# Patient Record
Sex: Female | Born: 1950
Health system: Southern US, Community
[De-identification: ages and names within clinical notes are randomized; demographics above are authoritative.]

## PROBLEM LIST (undated history)

## (undated) DIAGNOSIS — I10 Essential (primary) hypertension: Secondary | ICD-10-CM

## (undated) DIAGNOSIS — N200 Calculus of kidney: Secondary | ICD-10-CM

## (undated) DIAGNOSIS — E785 Hyperlipidemia, unspecified: Secondary | ICD-10-CM

## (undated) HISTORY — DX: Calculus of kidney: N20.0

## (undated) HISTORY — DX: Essential (primary) hypertension: I10

## (undated) HISTORY — DX: Hyperlipidemia, unspecified: E78.5

## (undated) HISTORY — PX: TRIGGER FINGER RELEASE: SHX641

---

## 2011-04-15 ENCOUNTER — Telehealth: Payer: Self-pay | Admitting: Internal Medicine

## 2011-04-15 NOTE — Telephone Encounter (Signed)
PT HAS TRUTRAC TEST STRIPS.  INSURANCE WANTS YOU ACCUCHECK TEST STRIPS  [facility] IN [place]  CAN THIS BE SENT TO DRUG STORE  PT HAS ? ABOUT METFORM  AND KIDNEY FAILURE  PLEASE CALL

## 2011-04-16 NOTE — Telephone Encounter (Signed)
I was reviewing the note on this patient. Please have her set up an appointment to address her questions. Thanks

## 2011-04-24 ENCOUNTER — Telehealth: Payer: Self-pay | Admitting: Internal Medicine

## 2011-04-24 NOTE — Telephone Encounter (Signed)
Pt needs test strips   She only has 4 test strips leftShe has appointment for 05/09/11 to see dr walker

## 2011-04-24 NOTE — Telephone Encounter (Signed)
error 

## 2011-04-28 ENCOUNTER — Other Ambulatory Visit: Payer: Self-pay | Admitting: Internal Medicine

## 2011-04-28 MED ORDER — GLUCOSE BLOOD VI STRP: ORAL_STRIP | Status: AC

## 2011-05-07 ENCOUNTER — Telehealth: Payer: Self-pay | Admitting: *Deleted

## 2011-05-07 ENCOUNTER — Other Ambulatory Visit (INDEPENDENT_AMBULATORY_CARE_PROVIDER_SITE_OTHER): Payer: BC Managed Care – PPO | Admitting: *Deleted

## 2011-05-07 DIAGNOSIS — N390 Urinary tract infection, site not specified: Secondary | ICD-10-CM

## 2011-05-07 DIAGNOSIS — Z0000 Encounter for general adult medical examination without abnormal findings: Secondary | ICD-10-CM

## 2011-05-07 LAB — CBC WITH DIFFERENTIAL/PLATELET
Basophils Relative: 0.3 % (ref 0.0–3.0)
Eosinophils Relative: 1.5 % (ref 0.0–5.0)
Hemoglobin: 14.4 g/dL (ref 12.0–15.0)
Lymphocytes Relative: 30.9 % (ref 12.0–46.0)
Monocytes Relative: 7 % (ref 3.0–12.0)
Neutro Abs: 3.8 10*3/uL (ref 1.4–7.7)
RBC: 4.88 Mil/uL (ref 3.87–5.11)

## 2011-05-07 LAB — LIPID PANEL
HDL: 47.8 mg/dL (ref >39.00)
LDL Cholesterol: 87 mg/dL (ref 0–99)
Total CHOL/HDL Ratio: 3
VLDL: 26.4 mg/dL (ref 0.0–40.0)

## 2011-05-07 LAB — TSH: TSH: 1.85 u[IU]/mL (ref 0.35–5.50)

## 2011-05-07 LAB — COMPREHENSIVE METABOLIC PANEL
ALT: 22 U/L (ref 0–35)
AST: 17 U/L (ref 0–37)
CO2: 25 mEq/L (ref 19–32)
Creatinine, Ser: 0.7 mg/dL (ref 0.4–1.2)
GFR: 87.41 mL/min (ref >60.00)
Total Bilirubin: 1 mg/dL (ref 0.3–1.2)

## 2011-05-07 MED ORDER — CIPROFLOXACIN HCL 500 MG PO TABS: 500.0000 mg | ORAL_TABLET | Freq: Two times a day (BID) | ORAL | Status: DC

## 2011-05-07 MED ORDER — SULFAMETHOXAZOLE-TRIMETHOPRIM 800-160 MG PO TABS: 1.0000 | ORAL_TABLET | Freq: Two times a day (BID) | ORAL | Status: DC

## 2011-05-07 NOTE — Telephone Encounter (Signed)
Pharmacy called and stated that patient is allergic to sulfa drugs, but patient had stated that she wasn't. Pharmacy is asking if something else could be called in. I also added sulfa drugs to her allergy list. Please advise.

## 2011-05-07 NOTE — Telephone Encounter (Signed)
Rx sent to pharmacy   

## 2011-05-07 NOTE — Progress Notes (Signed)
Addended by: Jobie Quaker on: 05/07/2011 12:08 PM   Modules accepted: Orders

## 2011-05-07 NOTE — Telephone Encounter (Signed)
OK. Please call in Cipro 500mg  by mouth twice daily x7 days. Disp 14 no refill.

## 2011-05-07 NOTE — Telephone Encounter (Signed)
When patient came in for labs she also said that she has been having some burning with urination and urine frequency. I got a urine specimen from her. The results have been entered into her chart and there was also a small amount of blood so I sent for culture.

## 2011-05-07 NOTE — Telephone Encounter (Signed)
We should treat her with Bactrim DS tab po bid x7 days, disp 14. Please confirm that she is not allergic to sulfa prior to calling in medication. Thanks

## 2011-05-07 NOTE — Progress Notes (Signed)
Addended by: Jobie Quaker on: 05/07/2011 12:47 PM   Modules accepted: Orders

## 2011-05-07 NOTE — Telephone Encounter (Signed)
Patient notified- Rx sent to pharmacy. 

## 2011-05-09 ENCOUNTER — Encounter: Payer: Self-pay | Admitting: Internal Medicine

## 2011-05-09 ENCOUNTER — Ambulatory Visit (INDEPENDENT_AMBULATORY_CARE_PROVIDER_SITE_OTHER): Payer: BC Managed Care – PPO | Admitting: Internal Medicine

## 2011-05-09 DIAGNOSIS — R109 Unspecified abdominal pain: Secondary | ICD-10-CM

## 2011-05-09 DIAGNOSIS — E785 Hyperlipidemia, unspecified: Secondary | ICD-10-CM | POA: Insufficient documentation

## 2011-05-09 DIAGNOSIS — E119 Type 2 diabetes mellitus without complications: Secondary | ICD-10-CM | POA: Insufficient documentation

## 2011-05-09 MED ORDER — METRONIDAZOLE 500 MG PO TABS: 500.0000 mg | ORAL_TABLET | Freq: Three times a day (TID) | ORAL | Status: AC

## 2011-05-09 NOTE — Patient Instructions (Signed)
Diverticulitis A diverticulum is a small pouch or sac on the colon. Diverticulosis is the presence of these diverticula on the colon. Diverticulitis is the irritation (inflammation) or infection of diverticula. CAUSES The colon and its diverticula contain germs (bacteria). If food particles block the tiny opening to a diverticulum, the bacteria inside can grow and cause an increase in pressure. This leads to infection and inflammation. SYMPTOMS  Belly (abdominal) pain and tenderness. Usually, the pain is located on the left side of your abdomen. However, it could be located elsewhere.   Fever.   Bloating.   Feeling sick to your stomach (nausea).   Throwing up (vomiting).   Abnormal stools.  DIAGNOSIS Your caregiver will take a history and perform a physical exam. Since many things can cause abdominal pain, other tests may be necessary. Tests may include:  Blood tests.  Urine tests.   X-ray of the abdomen.  CT scan of the abdomen.   Sometimes, surgery is needed to determine if diverticulitis or other conditions are causing your symptoms. TREATMENT Most of the time, you can be treated without surgery. Treatment includes:  Resting the bowels by only having liquids for a few days.   Intravenous (IV) fluids if you are losing fluids (dehydrated).   Medicines (antibiotics) that kill germs may be given.   Pain and nausea medicine, if needed.   As you improve, eating soft, easily digestible foods.   Surgery if the inflamed diverticulum has burst.  HOME CARE INSTRUCTIONS  Take all medicine as directed by your caregiver.   Try a clear liquid diet (broth, tea, or water, or as directed by your caregiver). You may then gradually begin a bland diet as tolerated.   You may be put on a high-fiber diet. Avoid nuts and seeds. Follow your caregiver's diet recommendations.   If your caregiver has given you a follow-up appointment, it is very important that you go. Not going could result  in lasting (chronic) or permanent injury, pain, and disability. If there is any problem keeping the appointment, call to reschedule.  SEEK MEDICAL CARE IF:  Pain does not improve.   You have a hard time advancing your diet beyond clear liquids.   Bowel movements do not return to normal.  SEEK IMMEDIATE MEDICAL CARE IF:  The pain becomes worse.   You have an oral temperature above 101.5, not controlled by medicine.   Repeated vomiting occurs.   You have bloody or black, tarry stools.   Symptoms that brought you to your caregiver become worse or are not getting better.  MAKE SURE YOU:  Understand these instructions.   Will watch your condition.   Will get help right away if you are not doing well or get worse.  Document Released: 05/14/2005 Document Re-Released: 10/29/2009 [employer] Patient Information 2011 [employer]. 

## 2011-05-09 NOTE — Progress Notes (Signed)
Subjective:    Patient ID: Amber Oliver, female    DOB: 30-Jan-1951, 60 y.o.   MRN: [id number]  HPI Ms. Lair is a 60 year old female with a history of diabetes mellitus who presents for followup. Her main concern today is her recent episode of left lower quadrant pain. This started approximately 2 weeks ago. She was concerned about a possible urinary tract infection and, when in the  office to have lab work performed, she gave a urine sample which was remarkable for a small amount of blood. Her urine was sent for culture and she was empirically treated with Cipro. After taking one dose of Cipro she reports that she developed hives and facial swelling. She took Benadryl with relief of her symptoms, and stopped her Cipro. Subsequently her urine culture came back with no growth. She reports that she continues to have some mild left lower quadrant pain. She notes a possible history of diverticulitis. However, she has no diarrhea, constipation, or blood noted in her stool. She reports a normal appetite and activity level and denies any fever or chills.  In regards to her diabetes, she reports that her blood sugars have been well-controlled. She brings a record today showing most of her blood sugars fasting between 80 and 110. Her postprandial blood sugar levels are typically between 100 and 150. She has one outlier at 207. She reports full compliance with her metformin.  Outpatient Encounter Prescriptions as of 05/09/2011  Medication Sig Dispense Refill  . atorvastatin (LIPITOR) 20 MG tablet Take 20 mg by mouth daily.       . cholecalciferol (VITAMIN D) 1000 UNITS tablet Take 1,000 Units by mouth 2 (two) times daily.        Marland Kitchen glucose blood (ACCU-CHEK ACTIVE STRIPS) test strip Use as directed  100 each  5  . metFORMIN (GLUCOPHAGE) 500 MG tablet Take 500 mg by mouth daily. 1/2 tablet       . metroNIDAZOLE (FLAGYL) 500 MG tablet Take 1 tablet (500 mg total) by mouth 3 (three) times daily.  42 tablet  0      Review of Systems  Constitutional: Negative for fever, chills, appetite change, fatigue and unexpected weight change.  HENT: Negative for ear pain, congestion, sore throat, trouble swallowing, neck pain, voice change and sinus pressure.   Eyes: Negative for visual disturbance.  Respiratory: Negative for cough, shortness of breath, wheezing and stridor.   Cardiovascular: Negative for chest pain, palpitations and leg swelling.  Gastrointestinal: Positive for abdominal pain. Negative for nausea, vomiting, diarrhea, constipation, blood in stool, abdominal distention and anal bleeding.  Genitourinary: Negative for dysuria, frequency, flank pain, vaginal bleeding, vaginal discharge, vaginal pain and pelvic pain.  Musculoskeletal: Negative for myalgias, arthralgias and gait problem.  Skin: Negative for color change and rash.  Neurological: Negative for dizziness and headaches.  Hematological: Negative for adenopathy. Does not bruise/bleed easily.  Psychiatric/Behavioral: Negative for suicidal ideas, sleep disturbance and dysphoric mood. The patient is not nervous/anxious.    BP 165/99  Pulse 83  Temp(Src) 99 F (37.2 C) (Oral)  Resp 16  Ht 5' 4.5" (1.638 m)  Wt 157 lb (71.215 kg)  BMI 26.53 kg/m2  SpO2 98%     Objective:   Physical Exam  Constitutional: She is oriented to person, place, and time. She appears well-developed and well-nourished. No distress.  HENT:  Head: Normocephalic and atraumatic.  Right Ear: External ear normal.  Left Ear: External ear normal.  Nose: Nose normal.  Mouth/Throat: Oropharynx is clear  and moist. No oropharyngeal exudate.  Eyes: Conjunctivae are normal. Pupils are equal, round, and reactive to light. Right eye exhibits no discharge. Left eye exhibits no discharge. No scleral icterus.  Neck: Normal range of motion. Neck supple. No tracheal deviation present. No thyromegaly present.  Cardiovascular: Normal rate, regular rhythm, normal heart sounds and  intact distal pulses.  Exam reveals no gallop and no friction rub.   No murmur heard. Pulmonary/Chest: Effort normal and breath sounds normal. No respiratory distress. She has no wheezes. She has no rales. She exhibits no tenderness.  Abdominal: Soft. She exhibits no distension and no mass. There is no hepatosplenomegaly. There is tenderness in the left lower quadrant. There is no rebound and no guarding.  Musculoskeletal: Normal range of motion. She exhibits no edema and no tenderness.  Lymphadenopathy:    She has no cervical adenopathy.  Neurological: She is alert and oriented to person, place, and time. No cranial nerve deficit. She exhibits normal muscle tone. Coordination normal.  Skin: Skin is warm and dry. No rash noted. She is not diaphoretic. No erythema. No pallor.  Psychiatric: She has a normal mood and affect. Her behavior is normal. Judgment and thought content normal.          Assessment & Plan:  1. Left Lower Quadrant Pain - patient with left lower quadrant pain and initially dysuria. Urine dip was remarkable for blood and patient was empirically treated for urinary tract infection with Cipro. However, she had an allergic reaction to Cipro so this medication was stopped. In the interim her urine culture came back negative. Her symptoms today are more suggestive of either diverticulitis or nephrolithiasis. Alternatively an ovarian cyst might present in this fashion. We will plan to treat her empirically with Flagyl. She has a followup visit with her gynecologist on Monday for pelvic ultrasound. If her pain persists or worsen I would prefer to get a CT of her abdomen without contrast to evaluate for nephrolithiasis. She will return to clinic in two weeks or earlier if symptoms worsen.  2. Diabetes Mellitus - patient with diabetes mellitus which has been very well controlled based on her report. Hemoglobin A1c was not sent with recent labs. We will plan to wait for 3 months and recheck  hemoglobin A1c at that time. Patient was offered flu shot today, however refused. Foot exam is remarkable for persistent onychomycosis. We discussed referral to podiatry. She would like to hold off for now. Need to obtain report on recent ophthalmology exam. Patient is on a statin. Blood pressure has been too low in the past for ACE inhibitor, however is elevated today. Will monitor at next visit in two weeks and consider adding ACEi.

## 2011-06-26 ENCOUNTER — Other Ambulatory Visit: Payer: Self-pay | Admitting: Internal Medicine

## 2011-06-30 ENCOUNTER — Telehealth: Payer: Self-pay | Admitting: Internal Medicine

## 2011-06-30 NOTE — Telephone Encounter (Signed)
Patient called in and states she needs a letter of medical necessity to appeal the insurance company to have her test strips covered.

## 2011-07-02 NOTE — Telephone Encounter (Signed)
Patient has called back and is worried that she is going to run out of time to appeal this.  Please advise.

## 2011-07-02 NOTE — Telephone Encounter (Signed)
Yes, fluctuating blood sugar.

## 2011-07-02 NOTE — Telephone Encounter (Signed)
I spoke w/pt - she has spoken with her insurance company. They are requiring a letter stating why she needs to test twice daily. Reason must be fluctuating blood sugar or med changes etc... Pt would like to pick up when ready. OK for this letter?

## 2011-07-11 NOTE — Telephone Encounter (Signed)
Letter pending signature

## 2011-07-14 NOTE — Telephone Encounter (Signed)
Letter up front for pick up. Patient informed. 

## 2011-07-24 ENCOUNTER — Encounter: Payer: Self-pay | Admitting: Internal Medicine

## 2011-07-24 ENCOUNTER — Other Ambulatory Visit (INDEPENDENT_AMBULATORY_CARE_PROVIDER_SITE_OTHER): Payer: Self-pay | Admitting: *Deleted

## 2011-07-24 DIAGNOSIS — E119 Type 2 diabetes mellitus without complications: Secondary | ICD-10-CM

## 2011-07-24 LAB — COMPREHENSIVE METABOLIC PANEL
ALT: 18 U/L (ref 0–35)
AST: 19 U/L (ref 0–37)
Alkaline Phosphatase: 64 U/L (ref 39–117)
CO2: 26 mEq/L (ref 19–32)
Creatinine, Ser: 0.8 mg/dL (ref 0.4–1.2)
GFR: 76.25 mL/min (ref >60.00)
Total Bilirubin: 0.3 mg/dL (ref 0.3–1.2)

## 2011-07-24 LAB — LIPID PANEL
HDL: 49.8 mg/dL (ref >39.00)
Total CHOL/HDL Ratio: 3
Triglycerides: 88 mg/dL (ref 0.0–149.0)
VLDL: 17.6 mg/dL (ref 0.0–40.0)

## 2011-07-28 ENCOUNTER — Encounter: Payer: Self-pay | Admitting: Internal Medicine

## 2011-07-28 ENCOUNTER — Ambulatory Visit (INDEPENDENT_AMBULATORY_CARE_PROVIDER_SITE_OTHER): Payer: BC Managed Care – PPO | Admitting: Internal Medicine

## 2011-07-28 VITALS — BP 142/74 | HR 73 | Temp 98.2°F | Wt 155.0 lb

## 2011-07-28 DIAGNOSIS — R109 Unspecified abdominal pain: Secondary | ICD-10-CM

## 2011-07-28 NOTE — Progress Notes (Signed)
Subjective:    Patient ID: Amber Oliver, female    DOB: [birth year], 60 y.o.   MRN: [id number]  HPI 60 year old female with a history of diabetes, hyperlipidemia, and recent episodes of abdominal pain presents for followup. She reports that she has been feeling well. She continues to have some intermittent episodes of watery diarrhea and left lower abdominal pain. She has not had blood in her stool. She has not had black tarry stools. She was treated with Cipro and Flagyl for suspected diverticulitis in symptoms improve somewhat. She would like to have followup visit with her GI physician. She reports that she is concerned that a 10 year interval from her last colonoscopy may be too long. She has also been taking some probiotics over-the-counter with minimal improvement in her symptoms.  In regards to her diabetes, she reports full compliance with her metformin. She has been checking her blood sugar typically 1-2 times per day. She has not had any blood sugars greater than 150.  In regards to her hyperlipidemia, we reviewed labs from last week which show LDL is at goal with near 70. She reports full compliance with her Lipitor. She denies any side effects from his medications such as myalgia.  Outpatient Encounter Prescriptions as of 07/28/2011  Medication Sig Dispense Refill  . atorvastatin (LIPITOR) 20 MG tablet Take 20 mg by mouth daily.       . cholecalciferol (VITAMIN D) 1000 UNITS tablet Take 1,000 Units by mouth 2 (two) times daily.        . fish oil-omega-3 fatty acids 1000 MG capsule Take 2 g by mouth daily. MEGA RED BRAND       . glucose blood (ACCU-CHEK ACTIVE STRIPS) test strip Use as directed  100 each  5  . metFORMIN (GLUCOPHAGE) 500 MG tablet TAKE ONE TABLET BY MOUTH TWICE DAILY  60 tablet  6    Review of Systems  Constitutional: Negative for fever, chills, appetite change, fatigue and unexpected weight change.  Eyes: Negative for visual disturbance.  Respiratory: Negative for  cough and shortness of breath.   Cardiovascular: Negative for chest pain, palpitations and leg swelling.  Gastrointestinal: Positive for abdominal pain and diarrhea. Negative for nausea, vomiting, constipation, blood in stool, abdominal distention and anal bleeding.  Genitourinary: Negative for dysuria and flank pain.  Musculoskeletal: Negative for myalgias, arthralgias and gait problem.  Skin: Negative for color change and rash.  Neurological: Negative for dizziness and headaches.  Hematological: Negative for adenopathy. Does not bruise/bleed easily.  Psychiatric/Behavioral: Negative for suicidal ideas, sleep disturbance and dysphoric mood. The patient is not nervous/anxious.    BP 142/74  Pulse 73  Temp(Src) 98.2 F (36.8 C) (Oral)  Wt 155 lb (70.308 kg)  SpO2 99%     Objective:   Physical Exam  Constitutional: She is oriented to person, place, and time. She appears well-developed and well-nourished. No distress.  HENT:  Head: Normocephalic and atraumatic.  Right Ear: External ear normal.  Left Ear: External ear normal.  Nose: Nose normal.  Mouth/Throat: Oropharynx is clear and moist. No oropharyngeal exudate.  Eyes: Conjunctivae are normal. Pupils are equal, round, and reactive to light. Right eye exhibits no discharge. Left eye exhibits no discharge. No scleral icterus.  Neck: Normal range of motion. Neck supple. No tracheal deviation present. No thyromegaly present.  Cardiovascular: Normal rate, regular rhythm, normal heart sounds and intact distal pulses.  Exam reveals no gallop and no friction rub.   No murmur heard. Pulmonary/Chest: Effort normal and  breath sounds normal. No respiratory distress. She has no wheezes. She has no rales. She exhibits no tenderness.  Abdominal: Soft. Bowel sounds are normal. She exhibits no distension and no mass. There is no tenderness. There is no rebound and no guarding.  Musculoskeletal: Normal range of motion. She exhibits no edema and no  tenderness.  Lymphadenopathy:    She has no cervical adenopathy.  Neurological: She is alert and oriented to person, place, and time. No cranial nerve deficit. She exhibits normal muscle tone. Coordination normal.  Skin: Skin is warm and dry. No rash noted. She is not diaphoretic. No erythema. No pallor.  Psychiatric: She has a normal mood and affect. Her behavior is normal. Judgment and thought content normal.          Assessment & Plan:  1. Diabetes - will continue metformin at night. Recent hemoglobin A1c was 5.9%. Encouraged her to check her sugar once per day. Followup in 3 months.  2. Abdominal pain -symptoms are improved and her exam is normal today. She is  concerned about the 10 year interval from her last colonoscopy and would like to repeat this sooner. She would like GI followup. We will set this up for her. We discussed ordering CT abdomen to better identify cause of her symptoms but she would like to defer until see by GI. She was previously seen at [facility]. Suspect that some of her symptoms may be secondary to IBS. We discussed probiotic such as Librarian, academic. She will follow up here in 3 months and prn.  3. Hyperlipidemia - patient with HL on Lipitor. Recent LDL is well controlled at 78. We'll continue current dose. Repeat lipids in 6 months.

## 2011-10-24 ENCOUNTER — Other Ambulatory Visit (INDEPENDENT_AMBULATORY_CARE_PROVIDER_SITE_OTHER): Payer: BC Managed Care – PPO

## 2011-10-24 DIAGNOSIS — E119 Type 2 diabetes mellitus without complications: Secondary | ICD-10-CM

## 2011-10-24 LAB — COMPREHENSIVE METABOLIC PANEL
ALT: 24 U/L (ref 0–35)
CO2: 28 mEq/L (ref 19–32)
Creatinine, Ser: 0.7 mg/dL (ref 0.4–1.2)
GFR: 85.9 mL/min (ref >60.00)
Total Bilirubin: 0.4 mg/dL (ref 0.3–1.2)

## 2011-10-24 LAB — LIPID PANEL
HDL: 52.1 mg/dL (ref >39.00)
Total CHOL/HDL Ratio: 3
Triglycerides: 114 mg/dL (ref 0.0–149.0)

## 2011-10-24 LAB — HEMOGLOBIN A1C: Hgb A1c MFr Bld: 6 % (ref 4.6–6.5)

## 2011-10-24 NOTE — Progress Notes (Signed)
Addended by: Baldomero Lamy on: 10/24/2011 08:08 AM   Modules accepted: Orders

## 2011-10-28 ENCOUNTER — Ambulatory Visit (INDEPENDENT_AMBULATORY_CARE_PROVIDER_SITE_OTHER): Payer: BC Managed Care – PPO | Admitting: Internal Medicine

## 2011-10-28 ENCOUNTER — Encounter: Payer: Self-pay | Admitting: Internal Medicine

## 2011-10-28 DIAGNOSIS — R109 Unspecified abdominal pain: Secondary | ICD-10-CM | POA: Insufficient documentation

## 2011-10-28 DIAGNOSIS — E785 Hyperlipidemia, unspecified: Secondary | ICD-10-CM

## 2011-10-28 DIAGNOSIS — E119 Type 2 diabetes mellitus without complications: Secondary | ICD-10-CM

## 2011-10-28 NOTE — Assessment & Plan Note (Signed)
Pain is now resolved. Patient reports that recent endoscopy was normal. Will request notes on this. Patient will call or return to clinic if symptoms recur.

## 2011-10-28 NOTE — Assessment & Plan Note (Signed)
Cholesterol well controlled on Lipitor. Will continue and repeat lipids and LFTs in 6 months.

## 2011-10-28 NOTE — Progress Notes (Signed)
Subjective:    Patient ID: Amber Oliver, female    DOB: 27-Feb-1951, 61 y.o.   MRN: [id number]  HPI 61 year old female with history of diabetes and hyperlipidemia presents for followup. She recently had an episode of lower abdominal pain and was seen by GI physician at [facility] for evaluation which included endoscopy. She reports the results of the endoscopy, CT of the abdomen, an MRI of the abdomen were all normal with the exception of a few internal hemorrhoids. She reports that the pain has now resolved. She has not had any further abdominal pain, distention, nausea, vomiting, diarrhea, or other complaints. Next  In regards to her diabetes, she reports her blood sugars have been well-controlled. She reports full compliance with her metformin.  In regards to hyperlipidemia, recent LDL was near goal at 71. She reports full compliance with Lipitor. She denies any noted side effects from this medication.  Outpatient Encounter Prescriptions as of 10/28/2011  Medication Sig Dispense Refill  . atorvastatin (LIPITOR) 20 MG tablet Take 20 mg by mouth daily.       . cholecalciferol (VITAMIN D) 1000 UNITS tablet Take 1,000 Units by mouth 2 (two) times daily.        . fish oil-omega-3 fatty acids 1000 MG capsule Take 2 g by mouth daily. MEGA RED BRAND       . glucose blood (ACCU-CHEK ACTIVE STRIPS) test strip Use as directed  100 each  5  . metFORMIN (GLUCOPHAGE) 500 MG tablet TAKE ONE TABLET BY MOUTH TWICE DAILY  60 tablet  6    Review of Systems  Constitutional: Negative for fever, chills, appetite change, fatigue and unexpected weight change.  HENT: Negative for ear pain, congestion, sore throat, trouble swallowing, neck pain, voice change and sinus pressure.   Eyes: Negative for visual disturbance.  Respiratory: Negative for cough, shortness of breath, wheezing and stridor.   Cardiovascular: Negative for chest pain, palpitations and leg swelling.  Gastrointestinal: Negative for nausea, vomiting,  abdominal pain, diarrhea, constipation, blood in stool, abdominal distention and anal bleeding.  Genitourinary: Negative for dysuria and flank pain.  Musculoskeletal: Negative for myalgias, arthralgias and gait problem.  Skin: Negative for color change and rash.  Neurological: Negative for dizziness and headaches.  Hematological: Negative for adenopathy. Does not bruise/bleed easily.  Psychiatric/Behavioral: Negative for suicidal ideas, sleep disturbance and dysphoric mood. The patient is not nervous/anxious.    BP 100/62  Pulse 97  Temp(Src) 98.2 F (36.8 C) (Oral)  Wt 155 lb (70.308 kg)  SpO2 99%     Objective:   Physical Exam  Constitutional: She is oriented to person, place, and time. She appears well-developed and well-nourished. No distress.  HENT:  Head: Normocephalic and atraumatic.  Right Ear: External ear normal.  Left Ear: External ear normal.  Nose: Nose normal.  Mouth/Throat: Oropharynx is clear and moist. No oropharyngeal exudate.  Eyes: Conjunctivae are normal. Pupils are equal, round, and reactive to light. Right eye exhibits no discharge. Left eye exhibits no discharge. No scleral icterus.  Neck: Normal range of motion. Neck supple. No tracheal deviation present. No thyromegaly present.  Cardiovascular: Normal rate, regular rhythm, normal heart sounds and intact distal pulses.  Exam reveals no gallop and no friction rub.   No murmur heard. Pulmonary/Chest: Effort normal and breath sounds normal. No respiratory distress. She has no wheezes. She has no rales. She exhibits no tenderness.  Abdominal: Soft. Bowel sounds are normal. She exhibits no distension and no mass. There is no tenderness. There  is no rebound and no guarding.  Musculoskeletal: Normal range of motion. She exhibits no edema and no tenderness.  Lymphadenopathy:    She has no cervical adenopathy.  Neurological: She is alert and oriented to person, place, and time. No cranial nerve deficit. She exhibits  normal muscle tone. Coordination normal.  Skin: Skin is warm and dry. No rash noted. She is not diaphoretic. No erythema. No pallor.  Psychiatric: She has a normal mood and affect. Her behavior is normal. Judgment and thought content normal.          Assessment & Plan:

## 2011-10-28 NOTE — Assessment & Plan Note (Signed)
Recent A1c was normal at 6%. Will plan to continue metformin and repeat A1c in 6 months.

## 2012-02-11 ENCOUNTER — Encounter: Payer: Self-pay | Admitting: Internal Medicine

## 2012-02-23 ENCOUNTER — Other Ambulatory Visit: Payer: Self-pay | Admitting: Internal Medicine

## 2012-03-01 ENCOUNTER — Telehealth: Payer: Self-pay | Admitting: Internal Medicine

## 2012-03-01 ENCOUNTER — Ambulatory Visit (INDEPENDENT_AMBULATORY_CARE_PROVIDER_SITE_OTHER): Payer: BC Managed Care – PPO | Admitting: Internal Medicine

## 2012-03-01 ENCOUNTER — Encounter: Payer: Self-pay | Admitting: Internal Medicine

## 2012-03-01 VITALS — BP 120/82 | HR 79 | Temp 98.9°F | Ht 64.5 in | Wt 153.5 lb

## 2012-03-01 DIAGNOSIS — R300 Dysuria: Secondary | ICD-10-CM

## 2012-03-01 DIAGNOSIS — N390 Urinary tract infection, site not specified: Secondary | ICD-10-CM | POA: Insufficient documentation

## 2012-03-01 LAB — POCT URINALYSIS DIPSTICK
Protein, UA: 100
Spec Grav, UA: 1.015
Urobilinogen, UA: 0.2
pH, UA: 7.5

## 2012-03-01 MED ORDER — CEPHALEXIN 500 MG PO CAPS: 500.0000 mg | ORAL_CAPSULE | Freq: Three times a day (TID) | ORAL | Status: AC

## 2012-03-01 NOTE — Progress Notes (Signed)
Subjective:    Patient ID: Amber Oliver, female    DOB: 1950/12/14, 61 y.o.   MRN: [id number]  HPI 61 year old female presents for acute visit complaining of three-day history of lower abdominal pain and hematuria. Patient denies fever, chills, flank pain. She has been increasing her intake of water with no improvement.  Outpatient Encounter Prescriptions as of 03/01/2012  Medication Sig Dispense Refill  . atorvastatin (LIPITOR) 20 MG tablet TAKE ONE TABLET BY MOUTH EVERY DAY  90 tablet  4  . cholecalciferol (VITAMIN D) 1000 UNITS tablet Take 1,000 Units by mouth 2 (two) times daily.        . fish oil-omega-3 fatty acids 1000 MG capsule Take 2 g by mouth daily. MEGA RED BRAND       . glucose blood (ACCU-CHEK ACTIVE STRIPS) test strip Use as directed  100 each  5  . metFORMIN (GLUCOPHAGE) 500 MG tablet TAKE ONE TABLET BY MOUTH TWICE DAILY  60 tablet  6  . cephALEXin (KEFLEX) 500 MG capsule Take 1 capsule (500 mg total) by mouth 3 (three) times daily.  15 capsule  0    Review of Systems  Constitutional: Negative for fever, chills, appetite change, fatigue and unexpected weight change.  HENT: Negative for ear pain, congestion, sore throat, trouble swallowing, neck pain, voice change and sinus pressure.   Eyes: Negative for visual disturbance.  Respiratory: Negative for cough, shortness of breath, wheezing and stridor.   Cardiovascular: Negative for chest pain, palpitations and leg swelling.  Gastrointestinal: Positive for abdominal pain. Negative for nausea, vomiting, diarrhea, constipation, blood in stool, abdominal distention and anal bleeding.  Genitourinary: Positive for dysuria, urgency, frequency and hematuria. Negative for flank pain.  Musculoskeletal: Negative for myalgias, arthralgias and gait problem.  Skin: Negative for color change and rash.  Neurological: Negative for dizziness and headaches.  Hematological: Negative for adenopathy. Does not bruise/bleed easily.    Psychiatric/Behavioral: Negative for suicidal ideas, disturbed wake/sleep cycle and dysphoric mood. The patient is not nervous/anxious.    BP 120/82  Pulse 79  Temp 98.9 F (37.2 C) (Oral)  Ht 5' 4.5" (1.638 m)  Wt 153 lb 8 oz (69.627 kg)  BMI 25.94 kg/m2  SpO2 98%     Objective:   Physical Exam  Constitutional: She is oriented to person, place, and time. She appears well-developed and well-nourished. No distress.  HENT:  Head: Normocephalic and atraumatic.  Right Ear: External ear normal.  Left Ear: External ear normal.  Nose: Nose normal.  Mouth/Throat: Oropharynx is clear and moist. No oropharyngeal exudate.  Eyes: Conjunctivae are normal. Pupils are equal, round, and reactive to light. Right eye exhibits no discharge. Left eye exhibits no discharge. No scleral icterus.  Neck: Normal range of motion. Neck supple. No tracheal deviation present. No thyromegaly present.  Cardiovascular: Normal rate, regular rhythm, normal heart sounds and intact distal pulses.  Exam reveals no gallop and no friction rub.   No murmur heard. Pulmonary/Chest: Effort normal and breath sounds normal. No respiratory distress. She has no wheezes. She has no rales. She exhibits no tenderness.  Abdominal: Soft. Bowel sounds are normal. She exhibits no distension and no mass. There is tenderness (suprapubic). There is no guarding.  Musculoskeletal: Normal range of motion. She exhibits no edema and no tenderness.  Lymphadenopathy:    She has no cervical adenopathy.  Neurological: She is alert and oriented to person, place, and time. No cranial nerve deficit. She exhibits normal muscle tone. Coordination normal.  Skin: Skin is  warm and dry. No rash noted. She is not diaphoretic. No erythema. No pallor.  Psychiatric: She has a normal mood and affect. Her behavior is normal. Judgment and thought content normal.          Assessment & Plan:

## 2012-03-01 NOTE — Assessment & Plan Note (Signed)
Symptoms consistent with urinary tract infection. Will treat with Keflex. Will send urine for culture.

## 2012-03-01 NOTE — Telephone Encounter (Signed)
Will address during visit today. ? ?

## 2012-03-01 NOTE — Telephone Encounter (Signed)
Caller: Emmery/Mother; PCP: Ronna Polio; CB#: [phone]; ; ; Call regarding Urinary Pain/Bleeding;  Onset-02/27/12  Afebrile. Pt c/o of  dysuria and frequency. She has seen blood but none today. Emergent s/s of Urinary s/s protocol r/o. Pt to see provider within 24hrs. Appt scheduled for today at 11:30am with Dr. Dan Humphreys.

## 2012-03-04 ENCOUNTER — Encounter: Payer: Self-pay | Admitting: Internal Medicine

## 2012-04-27 ENCOUNTER — Emergency Department: Payer: Self-pay | Admitting: Emergency Medicine

## 2012-04-27 LAB — BASIC METABOLIC PANEL
Anion Gap: 10 (ref 7–16)
BUN: 13 mg/dL (ref 7–18)
Calcium, Total: 9.8 mg/dL (ref 8.5–10.1)
Chloride: 106 mmol/L (ref 98–107)
Creatinine: 0.73 mg/dL (ref 0.60–1.30)
EGFR (African American): >60
EGFR (Non-African Amer.): >60
Osmolality: 284 (ref 275–301)

## 2012-04-27 LAB — URINALYSIS, COMPLETE
Bilirubin,UR: NEGATIVE
Glucose,UR: NEGATIVE mg/dL (ref 0–75)
Leukocyte Esterase: NEGATIVE
Nitrite: NEGATIVE
Ph: 7 (ref 4.5–8.0)
Protein: 30
RBC,UR: 366 /HPF (ref 0–5)
Squamous Epithelial: <1

## 2012-04-27 LAB — HEPATIC FUNCTION PANEL A (ARMC)
Albumin: 4.4 g/dL (ref 3.4–5.0)
Alkaline Phosphatase: 64 U/L (ref 50–136)
Bilirubin, Direct: 0.2 mg/dL (ref 0.00–0.20)
Bilirubin,Total: 0.8 mg/dL (ref 0.2–1.0)
SGOT(AST): 27 U/L (ref 15–37)
SGPT (ALT): 24 U/L (ref 12–78)
Total Protein: 7.9 g/dL (ref 6.4–8.2)

## 2012-04-27 LAB — CBC
HCT: 43.4 % (ref 35.0–47.0)
MCHC: 32.8 g/dL (ref 32.0–36.0)
MCV: 88 fL (ref 80–100)

## 2012-04-28 ENCOUNTER — Encounter: Payer: Self-pay | Admitting: Internal Medicine

## 2012-05-28 LAB — HM MAMMOGRAPHY

## 2012-06-01 ENCOUNTER — Encounter: Payer: Self-pay | Admitting: Internal Medicine

## 2012-06-01 ENCOUNTER — Ambulatory Visit (INDEPENDENT_AMBULATORY_CARE_PROVIDER_SITE_OTHER): Payer: BC Managed Care – PPO | Admitting: Internal Medicine

## 2012-06-01 VITALS — BP 132/84 | HR 68 | Temp 98.7°F | Ht 64.5 in | Wt 150.5 lb

## 2012-06-01 DIAGNOSIS — E119 Type 2 diabetes mellitus without complications: Secondary | ICD-10-CM

## 2012-06-01 DIAGNOSIS — E1121 Type 2 diabetes mellitus with diabetic nephropathy: Secondary | ICD-10-CM | POA: Insufficient documentation

## 2012-06-01 DIAGNOSIS — E785 Hyperlipidemia, unspecified: Secondary | ICD-10-CM

## 2012-06-01 LAB — HM MAMMOGRAPHY

## 2012-06-01 LAB — HEMOGLOBIN A1C: Hgb A1c MFr Bld: 5.9 % (ref 4.6–6.5)

## 2012-06-01 LAB — LIPID PANEL
Total CHOL/HDL Ratio: 3
VLDL: 24.6 mg/dL (ref 0.0–40.0)

## 2012-06-01 LAB — COMPREHENSIVE METABOLIC PANEL
ALT: 18 U/L (ref 0–35)
Albumin: 4.1 g/dL (ref 3.5–5.2)
CO2: 27 mEq/L (ref 19–32)
GFR: 99.78 mL/min (ref >60.00)
Glucose, Bld: 98 mg/dL (ref 70–99)
Potassium: 4.3 mEq/L (ref 3.5–5.1)
Sodium: 139 mEq/L (ref 135–145)
Total Protein: 7.3 g/dL (ref 6.0–8.3)

## 2012-06-01 MED ORDER — METFORMIN HCL 500 MG PO TABS: 500.0000 mg | ORAL_TABLET | Freq: Two times a day (BID) | ORAL | Status: DC

## 2012-06-01 NOTE — Assessment & Plan Note (Signed)
Will check lipids and LFTs with labs today. Continue lipitor. Follow up 3 months and prn.

## 2012-06-01 NOTE — Progress Notes (Signed)
Subjective:    Patient ID: Amber Oliver, female    DOB: [birth year], 61 y.o.   MRN: [id number]  HPI 61 year old female with history of diabetes and hyperlipidemia presents for followup. She reports she is doing well. Blood sugars have been typically between 90 and 100. She reports full compliance with metformin. She follows a healthy diet and get regular physical activity. She has no new concerns today.  Outpatient Encounter Prescriptions as of 06/01/2012  Medication Sig Dispense Refill  . atorvastatin (LIPITOR) 20 MG tablet TAKE ONE TABLET BY MOUTH EVERY DAY  90 tablet  4  . cholecalciferol (VITAMIN D) 1000 UNITS tablet Take 1,000 Units by mouth 2 (two) times daily.        . fish oil-omega-3 fatty acids 1000 MG capsule Take 2 g by mouth daily. MEGA RED BRAND       . metFORMIN (GLUCOPHAGE) 500 MG tablet Take 1 tablet (500 mg total) by mouth 2 (two) times daily with a meal.  60 tablet  6  . DISCONTD: metFORMIN (GLUCOPHAGE) 500 MG tablet TAKE ONE TABLET BY MOUTH TWICE DAILY  60 tablet  6   BP 132/84  Pulse 68  Temp 98.7 F (37.1 C) (Oral)  Ht 5' 4.5" (1.638 m)  Wt 150 lb 8 oz (68.266 kg)  BMI 25.43 kg/m2  SpO2 97%  Review of Systems  Constitutional: Negative for fever, chills, appetite change, fatigue and unexpected weight change.  HENT: Negative for ear pain, congestion, sore throat, trouble swallowing, neck pain, voice change and sinus pressure.   Eyes: Negative for visual disturbance.  Respiratory: Negative for cough, shortness of breath, wheezing and stridor.   Cardiovascular: Negative for chest pain, palpitations and leg swelling.  Gastrointestinal: Negative for nausea, vomiting, abdominal pain, diarrhea, constipation, blood in stool, abdominal distention and anal bleeding.  Genitourinary: Negative for dysuria and flank pain.  Musculoskeletal: Negative for myalgias, arthralgias and gait problem.  Skin: Negative for color change and rash.  Neurological: Negative for dizziness  and headaches.  Hematological: Negative for adenopathy. Does not bruise/bleed easily.  Psychiatric/Behavioral: Negative for suicidal ideas, disturbed wake/sleep cycle and dysphoric mood. The patient is not nervous/anxious.        Objective:   Physical Exam  Constitutional: She is oriented to person, place, and time. She appears well-developed and well-nourished. No distress.  HENT:  Head: Normocephalic and atraumatic.  Right Ear: External ear normal.  Left Ear: External ear normal.  Nose: Nose normal.  Mouth/Throat: Oropharynx is clear and moist. No oropharyngeal exudate.  Eyes: Conjunctivae normal are normal. Pupils are equal, round, and reactive to light. Right eye exhibits no discharge. Left eye exhibits no discharge. No scleral icterus.  Neck: Normal range of motion. Neck supple. No tracheal deviation present. No thyromegaly present.  Cardiovascular: Normal rate, regular rhythm, normal heart sounds and intact distal pulses.  Exam reveals no gallop and no friction rub.   No murmur heard. Pulmonary/Chest: Effort normal and breath sounds normal. No respiratory distress. She has no wheezes. She has no rales. She exhibits no tenderness.  Musculoskeletal: Normal range of motion. She exhibits no edema and no tenderness.  Lymphadenopathy:    She has no cervical adenopathy.  Neurological: She is alert and oriented to person, place, and time. No cranial nerve deficit. She exhibits normal muscle tone. Coordination normal.  Skin: Skin is warm and dry. No rash noted. She is not diaphoretic. No erythema. No pallor.  Psychiatric: She has a normal mood and affect. Her behavior is normal.  Judgment and thought content normal.          Assessment & Plan:

## 2012-06-01 NOTE — Assessment & Plan Note (Signed)
BG have been well controlled. Will check A1c with labs today. Continue metformin. Follow up 3 months and prn.

## 2012-06-02 ENCOUNTER — Encounter: Payer: Self-pay | Admitting: Internal Medicine

## 2012-06-02 ENCOUNTER — Telehealth: Payer: Self-pay | Admitting: Internal Medicine

## 2012-06-02 NOTE — Telephone Encounter (Signed)
Mammogram abnormal [id number]

## 2012-08-11 ENCOUNTER — Other Ambulatory Visit: Payer: Self-pay | Admitting: Internal Medicine

## 2012-08-14 ENCOUNTER — Other Ambulatory Visit: Payer: Self-pay | Admitting: Internal Medicine

## 2012-09-01 ENCOUNTER — Encounter: Payer: Self-pay | Admitting: Internal Medicine

## 2012-09-01 ENCOUNTER — Ambulatory Visit (INDEPENDENT_AMBULATORY_CARE_PROVIDER_SITE_OTHER): Payer: BC Managed Care – PPO | Admitting: Internal Medicine

## 2012-09-01 VITALS — BP 152/90 | HR 70 | Temp 98.7°F | Ht 64.5 in | Wt 159.0 lb

## 2012-09-01 DIAGNOSIS — E785 Hyperlipidemia, unspecified: Secondary | ICD-10-CM

## 2012-09-01 DIAGNOSIS — H6120 Impacted cerumen, unspecified ear: Secondary | ICD-10-CM

## 2012-09-01 DIAGNOSIS — IMO0001 Reserved for inherently not codable concepts without codable children: Secondary | ICD-10-CM

## 2012-09-01 DIAGNOSIS — R635 Abnormal weight gain: Secondary | ICD-10-CM

## 2012-09-01 DIAGNOSIS — R030 Elevated blood-pressure reading, without diagnosis of hypertension: Secondary | ICD-10-CM

## 2012-09-01 DIAGNOSIS — E119 Type 2 diabetes mellitus without complications: Secondary | ICD-10-CM

## 2012-09-01 LAB — COMPREHENSIVE METABOLIC PANEL
ALT: 21 U/L (ref 0–35)
CO2: 28 mEq/L (ref 19–32)
Calcium: 9.8 mg/dL (ref 8.4–10.5)
Chloride: 103 mEq/L (ref 96–112)
Creatinine, Ser: 0.8 mg/dL (ref 0.4–1.2)
GFR: 83.03 mL/min (ref >60.00)
Glucose, Bld: 103 mg/dL — ABNORMAL HIGH (ref 70–99)
Sodium: 138 mEq/L (ref 135–145)
Total Bilirubin: 1.1 mg/dL (ref 0.3–1.2)
Total Protein: 7.8 g/dL (ref 6.0–8.3)

## 2012-09-01 LAB — HEMOGLOBIN A1C: Hgb A1c MFr Bld: 6.1 % (ref 4.6–6.5)

## 2012-09-01 LAB — TSH: TSH: 2.48 u[IU]/mL (ref 0.35–5.50)

## 2012-09-01 LAB — LIPID PANEL
Cholesterol: 157 mg/dL (ref 0–200)
VLDL: 24.6 mg/dL (ref 0.0–40.0)

## 2012-09-01 LAB — MICROALBUMIN / CREATININE URINE RATIO
Creatinine,U: 67.9 mg/dL
Microalb Creat Ratio: 0.7 mg/g (ref 0.0–30.0)

## 2012-09-01 NOTE — Assessment & Plan Note (Signed)
Blood sugars per pt are well controlled. Will check A1c with labs today. Continue current medications. Will check A1c with labs today. Follow up 3 months and prn.

## 2012-09-01 NOTE — Patient Instructions (Signed)
Check blood pressure 1-2 times per week, then email with readings.

## 2012-09-01 NOTE — Assessment & Plan Note (Signed)
9 lb weight gain over last 3 months. Suspected related to limited physical activity and dietary indiscretion, however will check TSH with labs.

## 2012-09-01 NOTE — Assessment & Plan Note (Signed)
Blood pressure is elevated today, however has been low in the past. Will monitor over next week. If BP consistently  >130/80, will start ACEi. Discussed plan with pt today.

## 2012-09-01 NOTE — Assessment & Plan Note (Addendum)
Cerumen removed with warm water lavage. Minimal cerumen remaining. We discussed potentially setting up referral to ENT for more complete removal of cerumen, but pt would like to hold off for now. She will try using Ceruminex at home.

## 2012-09-01 NOTE — Progress Notes (Signed)
Subjective:    Patient ID: Amber Oliver, female    DOB: Mar 17, 1951, 62 y.o.   MRN: [id number]  HPI 62 year old female with history of diabetes and hyperlipidemia presents for followup. She reports blood sugars have generally been well-controlled. She did not bring record with her today. She reports compliance with her medications. She reports she is generally been feeling well. Over the last few weeks she has had some pain in her right ear which she attributes to increased wax. This has happened to her in the past. She denies any nasal congestion, fever, chills.  Outpatient Encounter Prescriptions as of 09/01/2012  Medication Sig Dispense Refill  . ACCU-CHEK ACTIVE STRIPS test strip USE ONE STRIP TO CHECK GLUCOSE TWICE DAILY  100 each  4  . atorvastatin (LIPITOR) 20 MG tablet TAKE ONE TABLET BY MOUTH EVERY DAY  90 tablet  4  . cholecalciferol (VITAMIN D) 1000 UNITS tablet Take 1,000 Units by mouth 2 (two) times daily.        . fish oil-omega-3 fatty acids 1000 MG capsule Take 2 g by mouth daily. MEGA RED BRAND       . metFORMIN (GLUCOPHAGE) 500 MG tablet Take 1 tablet (500 mg total) by mouth 2 (two) times daily with a meal.  60 tablet  6   BP 152/90  Pulse 70  Temp 98.7 F (37.1 C) (Oral)  Ht 5' 4.5" (1.638 m)  Wt 159 lb (72.122 kg)  BMI 26.87 kg/m2  SpO2 98%  Review of Systems  Constitutional: Negative for fever, chills, appetite change, fatigue and unexpected weight change.  HENT: Positive for ear pain. Negative for congestion, sore throat, trouble swallowing, neck pain, voice change and sinus pressure.   Eyes: Negative for visual disturbance.  Respiratory: Negative for cough, shortness of breath, wheezing and stridor.   Cardiovascular: Negative for chest pain, palpitations and leg swelling.  Gastrointestinal: Negative for nausea, vomiting, abdominal pain, diarrhea, constipation, blood in stool, abdominal distention and anal bleeding.  Genitourinary: Negative for dysuria and flank  pain.  Musculoskeletal: Negative for myalgias, arthralgias and gait problem.  Skin: Negative for color change and rash.  Neurological: Negative for dizziness and headaches.  Hematological: Negative for adenopathy. Does not bruise/bleed easily.  Psychiatric/Behavioral: Negative for suicidal ideas, sleep disturbance and dysphoric mood. The patient is not nervous/anxious.        Objective:   Physical Exam  Constitutional: She is oriented to person, place, and time. She appears well-developed and well-nourished. No distress.  HENT:  Head: Normocephalic and atraumatic.  Right Ear: External ear normal.  Left Ear: Tympanic membrane and external ear normal.  Nose: Nose normal.  Mouth/Throat: Oropharynx is clear and moist. No oropharyngeal exudate.       Right ear obstructed with cerumen which was partially removed with lavage.  Eyes: Conjunctivae normal are normal. Pupils are equal, round, and reactive to light. Right eye exhibits no discharge. Left eye exhibits no discharge. No scleral icterus.  Neck: Normal range of motion. Neck supple. No tracheal deviation present. No thyromegaly present.  Cardiovascular: Normal rate, regular rhythm, normal heart sounds and intact distal pulses.  Exam reveals no gallop and no friction rub.   No murmur heard. Pulmonary/Chest: Effort normal and breath sounds normal. No respiratory distress. She has no wheezes. She has no rales. She exhibits no tenderness.  Abdominal: Soft. Bowel sounds are normal. She exhibits no distension. There is no tenderness.  Musculoskeletal: Normal range of motion. She exhibits no edema and no tenderness.  Lymphadenopathy:  She has no cervical adenopathy.  Neurological: She is alert and oriented to person, place, and time. No cranial nerve deficit. She exhibits normal muscle tone. Coordination normal.  Skin: Skin is warm and dry. No rash noted. She is not diaphoretic. No erythema. No pallor.  Psychiatric: She has a normal mood and  affect. Her behavior is normal. Judgment and thought content normal.          Assessment & Plan:

## 2012-09-01 NOTE — Assessment & Plan Note (Signed)
Will check lipids and LFTs with labs today. Continue atorvastatin. 

## 2012-09-09 ENCOUNTER — Encounter: Payer: Self-pay | Admitting: Internal Medicine

## 2012-10-02 ENCOUNTER — Other Ambulatory Visit: Payer: Self-pay

## 2012-11-15 ENCOUNTER — Encounter: Payer: Self-pay | Admitting: Internal Medicine

## 2012-12-06 LAB — HM MAMMOGRAPHY: HM Mammogram: NORMAL

## 2012-12-15 ENCOUNTER — Encounter: Payer: Self-pay | Admitting: Internal Medicine

## 2012-12-22 ENCOUNTER — Encounter: Payer: Self-pay | Admitting: Internal Medicine

## 2012-12-22 ENCOUNTER — Ambulatory Visit (INDEPENDENT_AMBULATORY_CARE_PROVIDER_SITE_OTHER)
Admission: RE | Admit: 2012-12-22 | Discharge: 2012-12-22 | Disposition: A | Discharge status: Home / Self Care | Payer: BC Managed Care – PPO | Source: Ambulatory Visit | Attending: Internal Medicine | Admitting: Internal Medicine

## 2012-12-22 ENCOUNTER — Ambulatory Visit (INDEPENDENT_AMBULATORY_CARE_PROVIDER_SITE_OTHER): Payer: BC Managed Care – PPO | Admitting: Internal Medicine

## 2012-12-22 VITALS — BP 130/88 | HR 62 | Temp 97.8°F | Wt 156.0 lb

## 2012-12-22 DIAGNOSIS — M25559 Pain in unspecified hip: Secondary | ICD-10-CM

## 2012-12-22 DIAGNOSIS — M25551 Pain in right hip: Secondary | ICD-10-CM

## 2012-12-22 DIAGNOSIS — E119 Type 2 diabetes mellitus without complications: Secondary | ICD-10-CM

## 2012-12-22 DIAGNOSIS — Z23 Encounter for immunization: Secondary | ICD-10-CM

## 2012-12-22 LAB — COMPREHENSIVE METABOLIC PANEL
Alkaline Phosphatase: 61 U/L (ref 39–117)
BUN: 16 mg/dL (ref 6–23)
Creatinine, Ser: 0.8 mg/dL (ref 0.4–1.2)
Glucose, Bld: 98 mg/dL (ref 70–99)
Total Bilirubin: 0.7 mg/dL (ref 0.3–1.2)

## 2012-12-22 LAB — HEMOGLOBIN A1C: Hgb A1c MFr Bld: 6.2 % (ref 4.6–6.5)

## 2012-12-22 MED ORDER — ZOSTER VACCINE LIVE 19400 UNT/0.65ML ~~LOC~~ SOLR: 0.6500 mL | Freq: Once | SUBCUTANEOUS | Status: DC

## 2012-12-22 NOTE — Assessment & Plan Note (Signed)
Excellent control of BG based on pt report. Will check A1c with labs today. Discussed potentially stopping metformin if A1c<6%. Follow up 3 months.

## 2012-12-22 NOTE — Assessment & Plan Note (Signed)
Right hip pain is most consistent with iliotibial band syndrome. However, given patient's recent history of skin cancer over right hip, obtained plain xray for further evaluation. Xray showed no acute process. Will ice area and use NSAIDs as needed for pain. Discussed referral to physical therapy if symptoms are persistent.

## 2012-12-22 NOTE — Progress Notes (Signed)
Subjective:    Patient ID: Amber Oliver, female    DOB: 29-Mar-1951, 62 y.o.   MRN: [id number]  HPI 62 year old female with history of diabetes, hyperlipidemia presents for followup. She brings record of her blood sugars which show most blood sugars between 80 and 100. She has been taking metformin. Previous hemoglobin A1c readings have been between 5.6 and 6.2%. She questions whether she may be able to come off metformin. She isn't following a healthy, low carbohydrate diet and limiting intake of processed sugars. She has also been trying to exercise on a more regular basis.  She is concerned today about several week history of intermittent right hip pain. The pain is made worse by physical activity. It is described as aching sometimes radiates down the outside of her right leg to her right knee. She denies any injury to her hip. She denies any instability or weakness in her leg. She has not taken any medication for this. She notes that she recently had skin cancer excision done over her right hip. She questions whether this may be contributing to symptoms.  Outpatient Encounter Prescriptions as of 12/22/2012  Medication Sig Dispense Refill  . ACCU-CHEK ACTIVE STRIPS test strip USE ONE STRIP TO CHECK GLUCOSE TWICE DAILY  100 each  4  . atorvastatin (LIPITOR) 20 MG tablet TAKE ONE TABLET BY MOUTH EVERY DAY  90 tablet  4  . cholecalciferol (VITAMIN D) 1000 UNITS tablet Take 1,000 Units by mouth 2 (two) times daily.        . fish oil-omega-3 fatty acids 1000 MG capsule Take 2 g by mouth daily. MEGA RED BRAND       . metFORMIN (GLUCOPHAGE) 500 MG tablet Take 1 tablet (500 mg total) by mouth 2 (two) times daily with a meal.  60 tablet  6  . zoster vaccine live, PF, (ZOSTAVAX) 47829 UNT/0.65ML injection Inject 19,400 Units into the skin once.  1 each  0   No facility-administered encounter medications on file as of 12/22/2012.   BP 130/88  Pulse 62  Temp(Src) 97.8 F (36.6 C) (Oral)  Wt 156 lb  (70.761 kg)  BMI 26.37 kg/m2  SpO2 97%  Review of Systems  Constitutional: Negative for fever, chills, appetite change, fatigue and unexpected weight change.  HENT: Negative for ear pain, congestion, sore throat, trouble swallowing, neck pain, voice change and sinus pressure.   Eyes: Negative for visual disturbance.  Respiratory: Negative for cough, shortness of breath, wheezing and stridor.   Cardiovascular: Negative for chest pain, palpitations and leg swelling.  Gastrointestinal: Negative for nausea, vomiting, abdominal pain, diarrhea, constipation, blood in stool, abdominal distention and anal bleeding.  Genitourinary: Negative for dysuria and flank pain.  Musculoskeletal: Positive for myalgias and arthralgias. Negative for gait problem.  Skin: Negative for color change and rash.  Neurological: Negative for dizziness and headaches.  Hematological: Negative for adenopathy. Does not bruise/bleed easily.  Psychiatric/Behavioral: Negative for suicidal ideas, sleep disturbance and dysphoric mood. The patient is not nervous/anxious.        Objective:   Physical Exam  Constitutional: She is oriented to person, place, and time. She appears well-developed and well-nourished. No distress.  HENT:  Head: Normocephalic and atraumatic.  Right Ear: External ear normal.  Left Ear: External ear normal.  Nose: Nose normal.  Mouth/Throat: Oropharynx is clear and moist. No oropharyngeal exudate.  Eyes: Conjunctivae are normal. Pupils are equal, round, and reactive to light. Right eye exhibits no discharge. Left eye exhibits no discharge. No scleral  icterus.  Neck: Normal range of motion. Neck supple. No tracheal deviation present. No thyromegaly present.  Cardiovascular: Normal rate, regular rhythm, normal heart sounds and intact distal pulses.  Exam reveals no gallop and no friction rub.   No murmur heard. Pulmonary/Chest: Effort normal and breath sounds normal. No respiratory distress. She has no  wheezes. She has no rales. She exhibits no tenderness.  Abdominal: Soft. Bowel sounds are normal. She exhibits no distension. There is no tenderness. There is no rebound.  Musculoskeletal: Normal range of motion. She exhibits no edema and no tenderness.       Right hip: She exhibits normal range of motion, normal strength, no tenderness, no bony tenderness, no swelling and no deformity.       Legs: Lymphadenopathy:    She has no cervical adenopathy.  Neurological: She is alert and oriented to person, place, and time. No cranial nerve deficit. She exhibits normal muscle tone. Coordination normal.  Skin: Skin is warm and dry. No rash noted. She is not diaphoretic. No erythema. No pallor.  Psychiatric: She has a normal mood and affect. Her behavior is normal. Judgment and thought content normal.          Assessment & Plan:

## 2012-12-22 NOTE — Patient Instructions (Signed)
Iliotibial Band Syndrome  Iliotibial band syndrome is pain in the outer, upper thigh. The pain is due to an inflammation (soreness) of the iliotibial band. This is a band of thick fibrous tissue that runs down the outside of the thigh. The iliotibial band begins at the hip. It extends to the outer side of the shin bone (tibia) just below the knee joint. The band works with the thigh muscles. Together they provide stability to the outside of the knee joint.  Iliotibial band syndrome (ITBS) occurs when there is inflammation to this band of tissue. The irritation usually occurs over the outside of the knee joint, at the lateral epicondyle (the end of the femur bone.) The iliotibial band crosses bone and muscle at this point. Between these structures is a bursa (a cushioning sac). The bursa should make possible a smooth gliding motion. However, when inflamed, the iliotibial band does not glide easily. When inflamed, there is pain with motion of the knee. Usually the pain worsens with continued movement and the pain goes away with rest.  This problem usually arises when there is a sudden increase in sports activities involving the lower extremities (your legs). Running, and playing soccer or basketball are examples of activities causing this. Others who are prone to ITBS include individuals with mechanical problems such as leg length differences, abnormality of walking, bowed legs etc.  This diagnosis (learning what is wrong) is made by examination. X-rays are usually normal if only soft tissue inflammation is present.  Treatment of ITBS begins with proper footwear, icing the area of pain, stretching, and resting for a period of time. Incorporating low-impact cross-training activities may also help. Your caregiver may prescribe anti-inflammatory medications as well.  HOME CARE INSTRUCTIONS   · Apply ice to the sore area for 15 to 20 minutes, 3 to 4 times per day. Put the ice in a plastic bag and place a towel between the  bag of ice and your skin.  · Limit excessive training or eliminate training until pain goes away.  · While pain is present, you may use gentle range of motion. Do not resume regular use until instructed by your caregiver. Begin use gradually. Do not increase use to the point of pain. If pain does develop, decrease use and continue the above measures. Gradually increase activities that do not cause discomfort. Do this until you finally achieve normal use.  · Perform low impact activities while pain is present. Wear proper footwear.  · Only take over-the-counter or prescription medicines for pain, discomfort, or fever as directed by your caregiver.  SEEK MEDICAL CARE IF:   · Your pain increases or pain is not controlled with medications.  · You develop new, unexplained symptoms (problems), or an increase of the symptoms that brought you to your caregiver.  · Your pain and symptoms are not improving or are getting worse.  Document Released: 01/24/2002 Document Revised: 10/27/2011 Document Reviewed: 08/04/2005  [employer]® Patient Information ©2013 [employer].

## 2012-12-27 ENCOUNTER — Encounter: Payer: Self-pay | Admitting: Internal Medicine

## 2013-02-08 ENCOUNTER — Encounter: Payer: Self-pay | Admitting: Internal Medicine

## 2013-03-04 ENCOUNTER — Encounter: Payer: Self-pay | Admitting: Internal Medicine

## 2013-03-15 ENCOUNTER — Encounter: Payer: Self-pay | Admitting: Internal Medicine

## 2013-03-15 ENCOUNTER — Telehealth: Payer: Self-pay | Admitting: Internal Medicine

## 2013-03-15 NOTE — Telephone Encounter (Signed)
Need foot and eye exam

## 2013-03-16 ENCOUNTER — Other Ambulatory Visit: Payer: Self-pay | Admitting: Internal Medicine

## 2013-03-24 ENCOUNTER — Other Ambulatory Visit: Payer: Self-pay | Admitting: Internal Medicine

## 2013-03-24 ENCOUNTER — Encounter: Payer: Self-pay | Admitting: Internal Medicine

## 2013-03-29 ENCOUNTER — Encounter: Payer: Self-pay | Admitting: Internal Medicine

## 2013-03-31 ENCOUNTER — Encounter: Payer: Self-pay | Admitting: Internal Medicine

## 2013-03-31 ENCOUNTER — Telehealth: Payer: Self-pay | Admitting: Internal Medicine

## 2013-03-31 ENCOUNTER — Ambulatory Visit (INDEPENDENT_AMBULATORY_CARE_PROVIDER_SITE_OTHER): Payer: BC Managed Care – PPO | Admitting: Internal Medicine

## 2013-03-31 VITALS — BP 150/82 | HR 61 | Temp 97.5°F | Wt 160.0 lb

## 2013-03-31 DIAGNOSIS — Z23 Encounter for immunization: Secondary | ICD-10-CM

## 2013-03-31 DIAGNOSIS — I10 Essential (primary) hypertension: Secondary | ICD-10-CM | POA: Insufficient documentation

## 2013-03-31 DIAGNOSIS — E785 Hyperlipidemia, unspecified: Secondary | ICD-10-CM

## 2013-03-31 DIAGNOSIS — E119 Type 2 diabetes mellitus without complications: Secondary | ICD-10-CM

## 2013-03-31 DIAGNOSIS — R030 Elevated blood-pressure reading, without diagnosis of hypertension: Secondary | ICD-10-CM

## 2013-03-31 LAB — LIPID PANEL
Cholesterol: 151 mg/dL (ref 0–200)
HDL: 43.4 mg/dL
LDL Cholesterol: 91 mg/dL (ref 0–99)
Total CHOL/HDL Ratio: 3
Triglycerides: 81 mg/dL (ref 0.0–149.0)
VLDL: 16.2 mg/dL (ref 0.0–40.0)

## 2013-03-31 LAB — COMPREHENSIVE METABOLIC PANEL
AST: 19 U/L (ref 0–37)
Albumin: 4.3 g/dL (ref 3.5–5.2)
Alkaline Phosphatase: 54 U/L (ref 39–117)
Potassium: 4.4 mEq/L (ref 3.5–5.1)
Sodium: 137 mEq/L (ref 135–145)
Total Bilirubin: 0.8 mg/dL (ref 0.3–1.2)
Total Protein: 7.4 g/dL (ref 6.0–8.3)

## 2013-03-31 LAB — MICROALBUMIN / CREATININE URINE RATIO
Creatinine,U: 13.8 mg/dL
Microalb Creat Ratio: 7.2 mg/g (ref 0.0–30.0)
Microalb, Ur: 1 mg/dL (ref 0.0–1.9)

## 2013-03-31 LAB — HEMOGLOBIN A1C: Hgb A1c MFr Bld: 6.2 % (ref 4.6–6.5)

## 2013-03-31 NOTE — Assessment & Plan Note (Signed)
Will check lipids and LFTs with labs today. Continue atorvastatin. 

## 2013-03-31 NOTE — Assessment & Plan Note (Signed)
Blood sugars have been well-controlled per patient report. Will check A1c with labs today. Continue metformin. Foot exam normal today. Eye exam is up to date. Immunizations are up-to-date.

## 2013-03-31 NOTE — Progress Notes (Signed)
Subjective:    Patient ID: Amber Oliver, female    DOB: [birth year], 62 y.o.   MRN: [id number]  HPI 62 year old female with history of diabetes, hyperlipidemia presents for followup. She reports she has generally been feeling well. Blood sugars have been well controlled with most blood sugars near 100 fasting. She is compliant with medication. She denies any new concerns today.  Outpatient Encounter Prescriptions as of 03/31/2013  Medication Sig Dispense Refill  . ACCU-CHEK ACTIVE STRIPS test strip USE ONE STRIP TO CHECK GLUCOSE TWICE DAILY  100 each  4  . atorvastatin (LIPITOR) 20 MG tablet TAKE ONE TABLET BY MOUTH EVERY DAY  90 tablet  4  . cholecalciferol (VITAMIN D) 1000 UNITS tablet Take 1,000 Units by mouth 2 (two) times daily.        . fish oil-omega-3 fatty acids 1000 MG capsule Take 2 g by mouth daily. MEGA RED BRAND       . metFORMIN (GLUCOPHAGE) 500 MG tablet Take 1 tablet (500 mg total) by mouth 2 (two) times daily with a meal.  60 tablet  6  . [DISCONTINUED] zoster vaccine live, PF, (ZOSTAVAX) 78295 UNT/0.65ML injection Inject 19,400 Units into the skin once.  1 each  0   No facility-administered encounter medications on file as of 03/31/2013.   BP 160/92  Pulse 61  Temp(Src) 97.5 F (36.4 C) (Oral)  Wt 160 lb (72.576 kg)  BMI 27.05 kg/m2  SpO2 97%  Review of Systems  Constitutional: Negative for fever, chills, appetite change, fatigue and unexpected weight change.  HENT: Negative for ear pain, congestion, sore throat, trouble swallowing, neck pain, voice change and sinus pressure.   Eyes: Negative for visual disturbance.  Respiratory: Negative for cough, shortness of breath, wheezing and stridor.   Cardiovascular: Negative for chest pain, palpitations and leg swelling.  Gastrointestinal: Negative for nausea, vomiting, abdominal pain, diarrhea, constipation, blood in stool, abdominal distention and anal bleeding.  Genitourinary: Negative for dysuria and flank pain.   Musculoskeletal: Negative for myalgias, arthralgias and gait problem.  Skin: Negative for color change and rash.  Neurological: Negative for dizziness and headaches.  Hematological: Negative for adenopathy. Does not bruise/bleed easily.  Psychiatric/Behavioral: Negative for suicidal ideas, sleep disturbance and dysphoric mood. The patient is not nervous/anxious.        Objective:   Physical Exam  Constitutional: She is oriented to person, place, and time. She appears well-developed and well-nourished. No distress.  HENT:  Head: Normocephalic and atraumatic.  Right Ear: External ear normal.  Left Ear: External ear normal.  Nose: Nose normal.  Mouth/Throat: Oropharynx is clear and moist. No oropharyngeal exudate.  Eyes: Conjunctivae are normal. Pupils are equal, round, and reactive to light. Right eye exhibits no discharge. Left eye exhibits no discharge. No scleral icterus.  Neck: Normal range of motion. Neck supple. No tracheal deviation present. No thyromegaly present.  Cardiovascular: Normal rate, regular rhythm, normal heart sounds and intact distal pulses.  Exam reveals no gallop and no friction rub.   No murmur heard. Pulmonary/Chest: Effort normal and breath sounds normal. No accessory muscle usage. Not tachypneic. No respiratory distress. She has no decreased breath sounds. She has no wheezes. She has no rhonchi. She has no rales. She exhibits no tenderness.  Musculoskeletal: Normal range of motion. She exhibits no edema and no tenderness.  Lymphadenopathy:    She has no cervical adenopathy.  Neurological: She is alert and oriented to person, place, and time. No cranial nerve deficit. She exhibits normal muscle  tone. Coordination normal.  Skin: Skin is warm and dry. No rash noted. She is not diaphoretic. No erythema. No pallor.  Psychiatric: She has a normal mood and affect. Her behavior is normal. Judgment and thought content normal.          Assessment & Plan:

## 2013-03-31 NOTE — Addendum Note (Signed)
Addended by: Theola Sequin on: 03/31/2013 01:23 PM   Modules accepted: Orders

## 2013-03-31 NOTE — Assessment & Plan Note (Signed)
BP Readings from Last 3 Encounters:  03/31/13 150/82  12/22/12 130/88  09/01/12 152/90   BP slightly elevated today. Historically BP has been well controlled, with some hypotension precluding use of ACEi. Will monitor closely. If persistently  >130/80, then will consider adding ACEi. Will check renal function and urine microalbumin with labs today.

## 2013-03-31 NOTE — Telephone Encounter (Signed)
Follow up.

## 2013-04-04 ENCOUNTER — Other Ambulatory Visit: Payer: Self-pay | Admitting: *Deleted

## 2013-04-04 MED ORDER — ATORVASTATIN CALCIUM 20 MG PO TABS: ORAL_TABLET | ORAL | Status: DC

## 2013-05-04 ENCOUNTER — Encounter: Payer: Self-pay | Admitting: Internal Medicine

## 2013-06-13 ENCOUNTER — Other Ambulatory Visit: Payer: Self-pay | Admitting: *Deleted

## 2013-06-13 DIAGNOSIS — E119 Type 2 diabetes mellitus without complications: Secondary | ICD-10-CM

## 2013-06-13 LAB — HM MAMMOGRAPHY: HM Mammogram: NORMAL

## 2013-06-13 LAB — HM PAP SMEAR: HM Pap smear: NORMAL

## 2013-06-13 MED ORDER — METFORMIN HCL 500 MG PO TABS: 500.0000 mg | ORAL_TABLET | Freq: Two times a day (BID) | ORAL | Status: DC

## 2013-06-23 ENCOUNTER — Other Ambulatory Visit: Payer: Self-pay

## 2013-06-28 ENCOUNTER — Encounter: Payer: Self-pay | Admitting: Internal Medicine

## 2013-07-04 ENCOUNTER — Ambulatory Visit: Payer: BC Managed Care – PPO | Admitting: Internal Medicine

## 2013-10-03 ENCOUNTER — Ambulatory Visit (INDEPENDENT_AMBULATORY_CARE_PROVIDER_SITE_OTHER): Payer: BC Managed Care – PPO | Admitting: Internal Medicine

## 2013-10-03 ENCOUNTER — Encounter: Payer: Self-pay | Admitting: Internal Medicine

## 2013-10-03 VITALS — BP 140/88 | HR 73 | Temp 97.6°F | Wt 166.0 lb

## 2013-10-03 DIAGNOSIS — R030 Elevated blood-pressure reading, without diagnosis of hypertension: Secondary | ICD-10-CM

## 2013-10-03 DIAGNOSIS — M79609 Pain in unspecified limb: Secondary | ICD-10-CM

## 2013-10-03 DIAGNOSIS — M79671 Pain in right foot: Secondary | ICD-10-CM | POA: Insufficient documentation

## 2013-10-03 DIAGNOSIS — IMO0001 Reserved for inherently not codable concepts without codable children: Secondary | ICD-10-CM

## 2013-10-03 DIAGNOSIS — E785 Hyperlipidemia, unspecified: Secondary | ICD-10-CM

## 2013-10-03 DIAGNOSIS — E119 Type 2 diabetes mellitus without complications: Secondary | ICD-10-CM

## 2013-10-03 LAB — LIPID PANEL
CHOLESTEROL: 149 mg/dL (ref 0–200)
HDL: 47.6 mg/dL (ref >39.00)
LDL Cholesterol: 75 mg/dL (ref 0–99)
Total CHOL/HDL Ratio: 3
Triglycerides: 133 mg/dL (ref 0.0–149.0)
VLDL: 26.6 mg/dL (ref 0.0–40.0)

## 2013-10-03 LAB — MICROALBUMIN / CREATININE URINE RATIO
Creatinine,U: 26.3 mg/dL
MICROALB UR: 0.3 mg/dL (ref 0.0–1.9)
Microalb Creat Ratio: 1.1 mg/g (ref 0.0–30.0)

## 2013-10-03 LAB — COMPREHENSIVE METABOLIC PANEL
ALT: 30 U/L (ref 0–35)
AST: 23 U/L (ref 0–37)
Albumin: 4 g/dL (ref 3.5–5.2)
Alkaline Phosphatase: 74 U/L (ref 39–117)
BILIRUBIN TOTAL: 0.6 mg/dL (ref 0.3–1.2)
BUN: 16 mg/dL (ref 6–23)
CO2: 26 meq/L (ref 19–32)
CREATININE: 0.7 mg/dL (ref 0.4–1.2)
Calcium: 9 mg/dL (ref 8.4–10.5)
Chloride: 106 mEq/L (ref 96–112)
GFR: 91.09 mL/min (ref >60.00)
Glucose, Bld: 95 mg/dL (ref 70–99)
Potassium: 4.1 mEq/L (ref 3.5–5.1)
SODIUM: 139 meq/L (ref 135–145)
TOTAL PROTEIN: 7.1 g/dL (ref 6.0–8.3)

## 2013-10-03 LAB — HEMOGLOBIN A1C: Hgb A1c MFr Bld: 6.3 % (ref 4.6–6.5)

## 2013-10-03 MED ORDER — METFORMIN HCL 500 MG PO TABS: 500.0000 mg | ORAL_TABLET | Freq: Two times a day (BID) | ORAL | Status: DC

## 2013-10-03 MED ORDER — ATORVASTATIN CALCIUM 20 MG PO TABS: ORAL_TABLET | ORAL | Status: DC

## 2013-10-03 NOTE — Assessment & Plan Note (Signed)
BP Readings from Last 3 Encounters:  10/03/13 140/88  03/31/13 150/82  12/22/12 130/88   BP has been elevated on last two visits. Recommend starting lisinopril 5mg  daily and repeat Cr and K in 1 week.

## 2013-10-03 NOTE — Assessment & Plan Note (Signed)
Right lateral foot pain x several weeks. Exam is normal. Suspect diabetic neuropathy, however will set up podiatry evaluation. Question if orthotic might be helpful. Discussed potentially starting neurontin if symptoms are persistent.

## 2013-10-03 NOTE — Assessment & Plan Note (Signed)
Lab Results  Component Value Date   LDLCALC 75 10/03/2013   Lipids well controlled on Atorvastatin. Will continue.

## 2013-10-03 NOTE — Progress Notes (Signed)
Subjective:    Patient ID: Amber Oliver, female    DOB: Nov 28, 1950, 63 y.o.   MRN: [id number]  HPI 63YO female with DM presents for follow up.  DM- BG reportedly well controlled, did not bring record today. Compliant with metformin. Notes some dietary indiscretion with recent time constraints.  Concerned about pain described as cramping or aching in right foot distal 3rd, 4th, 5th toes. No numbness. Tried medicated cream with no improvement. No trauma to foot. No swelling in foot.  Review of Systems  Constitutional: Negative for fever, chills, appetite change, fatigue and unexpected weight change.  HENT: Negative for congestion, ear pain, sinus pressure, sore throat, trouble swallowing and voice change.   Eyes: Negative for visual disturbance.  Respiratory: Negative for cough, shortness of breath, wheezing and stridor.   Cardiovascular: Negative for chest pain, palpitations and leg swelling.  Gastrointestinal: Negative for nausea, vomiting, abdominal pain, diarrhea, constipation, blood in stool, abdominal distention and anal bleeding.  Genitourinary: Negative for dysuria and flank pain.  Musculoskeletal: Positive for arthralgias. Negative for gait problem, myalgias and neck pain.  Skin: Negative for color change and rash.  Neurological: Negative for dizziness and headaches.  Hematological: Negative for adenopathy. Does not bruise/bleed easily.  Psychiatric/Behavioral: Negative for suicidal ideas, sleep disturbance and dysphoric mood. The patient is not nervous/anxious.        Objective:    BP 140/88  Pulse 73  Temp(Src) 97.6 F (36.4 C) (Oral)  Wt 166 lb (75.297 kg)  SpO2 97% Physical Exam  Constitutional: She is oriented to person, place, and time. She appears well-developed and well-nourished. No distress.  HENT:  Head: Normocephalic and atraumatic.  Right Ear: External ear normal.  Left Ear: External ear normal.  Nose: Nose normal.  Mouth/Throat: Oropharynx is clear  and moist. No oropharyngeal exudate.  Eyes: Conjunctivae are normal. Pupils are equal, round, and reactive to light. Right eye exhibits no discharge. Left eye exhibits no discharge. No scleral icterus.  Neck: Normal range of motion. Neck supple. No tracheal deviation present. No thyromegaly present.  Cardiovascular: Normal rate, regular rhythm, normal heart sounds and intact distal pulses.  Exam reveals no gallop and no friction rub.   No murmur heard. Pulmonary/Chest: Effort normal and breath sounds normal. No accessory muscle usage. Not tachypneic. No respiratory distress. She has no decreased breath sounds. She has no wheezes. She has no rhonchi. She has no rales. She exhibits no tenderness.  Musculoskeletal: Normal range of motion. She exhibits no edema.       Right foot: She exhibits tenderness.       Feet:  Lymphadenopathy:    She has no cervical adenopathy.  Neurological: She is alert and oriented to person, place, and time. No cranial nerve deficit. She exhibits normal muscle tone. Coordination normal.  Skin: Skin is warm and dry. No rash noted. She is not diaphoretic. No erythema. No pallor.  Psychiatric: She has a normal mood and affect. Her behavior is normal. Judgment and thought content normal.          Assessment & Plan:   Problem List Items Addressed This Visit   Diabetes type 2, controlled - Primary      Lab Results  Component Value Date   HGBA1C 6.3 10/03/2013   Excellent control of BG with metformin and diet. Encouraged continued effort at healthy diet and regular exercise.     Relevant Medications      metFORMIN (GLUCOPHAGE) tablet      atorvastatin (LIPITOR) tablet  Other Relevant Orders      Lipid panel (Completed)      Microalbumin / creatinine urine ratio (Completed)      Hemoglobin A1c (Completed)      Comprehensive metabolic panel (Completed)   Elevated BP      BP Readings from Last 3 Encounters:  10/03/13 140/88  03/31/13 150/82  12/22/12 130/88     BP has been elevated on last two visits. Recommend starting lisinopril 5mg  daily and repeat Cr and K in 1 week.    Hyperlipidemia LDL goal <70      Lab Results  Component Value Date   LDLCALC 75 10/03/2013   Lipids well controlled on Atorvastatin. Will continue.    Right foot pain     Right lateral foot pain x several weeks. Exam is normal. Suspect diabetic neuropathy, however will set up podiatry evaluation. Question if orthotic might be helpful. Discussed potentially starting neurontin if symptoms are persistent.    Relevant Orders      Ambulatory referral to Podiatry       Return in about 3 months (around 12/31/2013) for Recheck of Diabetes.

## 2013-10-03 NOTE — Assessment & Plan Note (Addendum)
Lab Results  Component Value Date   HGBA1C 6.3 10/03/2013   Excellent control of BG with metformin and diet. Encouraged continued effort at healthy diet and regular exercise.

## 2013-10-05 ENCOUNTER — Other Ambulatory Visit: Payer: Self-pay | Admitting: *Deleted

## 2013-10-05 ENCOUNTER — Other Ambulatory Visit: Payer: Self-pay | Admitting: Internal Medicine

## 2013-10-05 ENCOUNTER — Encounter: Payer: Self-pay | Admitting: Internal Medicine

## 2013-10-05 DIAGNOSIS — I10 Essential (primary) hypertension: Secondary | ICD-10-CM

## 2013-10-05 MED ORDER — LISINOPRIL 5 MG PO TABS: 5.0000 mg | ORAL_TABLET | Freq: Every day | ORAL | Status: DC

## 2013-10-05 NOTE — Telephone Encounter (Signed)
What dx code would you like to use for her lab next week?

## 2013-10-05 NOTE — Telephone Encounter (Signed)
401.9 

## 2013-10-05 NOTE — Telephone Encounter (Signed)
Message copied by Ronaldo Miyamoto on Wed Oct 05, 2013 11:25 AM ------      Message from: Ronette Deter A      Created: Mon Oct 03, 2013  2:12 PM      Regarding: Blood Pressure       I was reviewing her records and noted that BP was elevated at last visit as well. I would recommend starting Lisinopril 5mg  daily and then check Cr and K in 1 week, with nurse BP check in 1 week.  ------

## 2013-10-06 ENCOUNTER — Other Ambulatory Visit: Payer: Self-pay | Admitting: Internal Medicine

## 2013-10-07 ENCOUNTER — Telehealth: Payer: Self-pay

## 2013-10-07 NOTE — Telephone Encounter (Signed)
Relevant patient education assigned to patient using Emmi. ° °

## 2013-10-12 ENCOUNTER — Ambulatory Visit (INDEPENDENT_AMBULATORY_CARE_PROVIDER_SITE_OTHER): Payer: BC Managed Care – PPO

## 2013-10-12 ENCOUNTER — Encounter: Payer: Self-pay | Admitting: Podiatry

## 2013-10-12 ENCOUNTER — Ambulatory Visit (INDEPENDENT_AMBULATORY_CARE_PROVIDER_SITE_OTHER): Payer: BC Managed Care – PPO | Admitting: Podiatry

## 2013-10-12 VITALS — BP 131/80 | HR 75 | Resp 16 | Ht 65.0 in | Wt 166.0 lb

## 2013-10-12 DIAGNOSIS — E119 Type 2 diabetes mellitus without complications: Secondary | ICD-10-CM

## 2013-10-12 DIAGNOSIS — M79609 Pain in unspecified limb: Secondary | ICD-10-CM

## 2013-10-12 DIAGNOSIS — G5760 Lesion of plantar nerve, unspecified lower limb: Secondary | ICD-10-CM

## 2013-10-12 DIAGNOSIS — G588 Other specified mononeuropathies: Secondary | ICD-10-CM

## 2013-10-12 DIAGNOSIS — M79671 Pain in right foot: Secondary | ICD-10-CM

## 2013-10-12 DIAGNOSIS — M79672 Pain in left foot: Principal | ICD-10-CM

## 2013-10-12 NOTE — Progress Notes (Signed)
   Subjective:    Patient ID: Amber Oliver, female    DOB: Sep 25, 1950, 63 y.o.   MRN: [id number]  HPI Comments: Right foot the last three toes feels like a constant cramp, #3#4#5 toes cramp ,along the lateral side of the 5th met hurts as well , its been going on for a couple of months and at night seem to throb.  Pt is diabetic and has been for about 5-6 years last a1c was a 6.3   Foot Pain      Review of Systems  Endocrine:       Diabetes   All other systems reviewed and are negative.       Objective:   Physical Exam: I have reviewed her past medical history medications allergies surgeries social history and review of systems. Pulses are strongly palpable bilateral. Neurologic sensorium is intact per Semmes-Weinstein monofilament however she does have a palpable Mulder's click with exquisite pain to the third interdigital space of the right foot. Muscle strength was 5 over 5 dorsiflexors plantar flexors inverters everters all intrinsic musculature appears to be intact orthopedic evaluation demonstrates mild HAV deformity hammertoe deformities noted bilateral but are asymptomatic. Otherwise all joints distal to the ankle have a full range of motion without crepitation. Cutaneous evaluation demonstrates supple well hydrated cutis no erythema edema cellulitis drainage or odor.        Assessment & Plan:  Assessment: Neuroma third interdigital space right foot.  Plan: Kenalog injection to the third interdigital space of the right foot.

## 2013-10-14 ENCOUNTER — Other Ambulatory Visit: Payer: BC Managed Care – PPO

## 2013-10-17 ENCOUNTER — Other Ambulatory Visit (INDEPENDENT_AMBULATORY_CARE_PROVIDER_SITE_OTHER): Payer: BC Managed Care – PPO

## 2013-10-17 ENCOUNTER — Ambulatory Visit: Payer: BC Managed Care – PPO

## 2013-10-17 DIAGNOSIS — E119 Type 2 diabetes mellitus without complications: Secondary | ICD-10-CM

## 2013-10-17 DIAGNOSIS — I10 Essential (primary) hypertension: Secondary | ICD-10-CM

## 2013-10-17 LAB — BASIC METABOLIC PANEL
BUN: 21 mg/dL (ref 6–23)
CALCIUM: 9.3 mg/dL (ref 8.4–10.5)
CO2: 24 mEq/L (ref 19–32)
Chloride: 103 mEq/L (ref 96–112)
Creatinine, Ser: 0.8 mg/dL (ref 0.4–1.2)
GFR: 77.91 mL/min (ref >60.00)
Glucose, Bld: 87 mg/dL (ref 70–99)
Potassium: 4.7 mEq/L (ref 3.5–5.1)
SODIUM: 138 meq/L (ref 135–145)

## 2013-10-17 LAB — COMPREHENSIVE METABOLIC PANEL
ALK PHOS: 57 U/L (ref 39–117)
ALT: 23 U/L (ref 0–35)
AST: 17 U/L (ref 0–37)
Albumin: 4.2 g/dL (ref 3.5–5.2)
BILIRUBIN TOTAL: 0.6 mg/dL (ref 0.3–1.2)
BUN: 21 mg/dL (ref 6–23)
CO2: 27 mEq/L (ref 19–32)
CREATININE: 0.8 mg/dL (ref 0.4–1.2)
Calcium: 9.4 mg/dL (ref 8.4–10.5)
Chloride: 102 mEq/L (ref 96–112)
GFR: 79.07 mL/min (ref >60.00)
Glucose, Bld: 91 mg/dL (ref 70–99)
Potassium: 4.6 mEq/L (ref 3.5–5.1)
Sodium: 135 mEq/L (ref 135–145)
Total Protein: 7.4 g/dL (ref 6.0–8.3)

## 2013-10-17 LAB — LIPID PANEL
CHOL/HDL RATIO: 3
Cholesterol: 170 mg/dL (ref 0–200)
HDL: 50.4 mg/dL (ref >39.00)
LDL CALC: 106 mg/dL — AB (ref 0–99)
Triglycerides: 69 mg/dL (ref 0.0–149.0)
VLDL: 13.8 mg/dL (ref 0.0–40.0)

## 2013-10-17 LAB — HEMOGLOBIN A1C: HEMOGLOBIN A1C: 6.3 % (ref 4.6–6.5)

## 2013-10-19 ENCOUNTER — Encounter: Payer: Self-pay | Admitting: *Deleted

## 2013-10-31 ENCOUNTER — Ambulatory Visit: Payer: BC Managed Care – PPO | Admitting: Podiatry

## 2013-11-22 ENCOUNTER — Other Ambulatory Visit: Payer: Self-pay | Admitting: *Deleted

## 2013-12-25 ENCOUNTER — Encounter: Payer: Self-pay | Admitting: Internal Medicine

## 2013-12-26 ENCOUNTER — Ambulatory Visit (INDEPENDENT_AMBULATORY_CARE_PROVIDER_SITE_OTHER): Payer: BC Managed Care – PPO | Admitting: Internal Medicine

## 2013-12-26 ENCOUNTER — Encounter: Payer: Self-pay | Admitting: Internal Medicine

## 2013-12-26 VITALS — BP 114/74 | HR 82 | Temp 98.2°F | Wt 165.0 lb

## 2013-12-26 DIAGNOSIS — N390 Urinary tract infection, site not specified: Secondary | ICD-10-CM

## 2013-12-26 LAB — POCT URINALYSIS DIPSTICK
Bilirubin, UA: NEGATIVE
Glucose, UA: NEGATIVE
Ketones, UA: NEGATIVE
NITRITE UA: NEGATIVE
PH UA: 7
Protein, UA: 100
Spec Grav, UA: 1.025
UROBILINOGEN UA: 0.2

## 2013-12-26 MED ORDER — CEPHALEXIN 500 MG PO CAPS: 500.0000 mg | ORAL_CAPSULE | Freq: Three times a day (TID) | ORAL | Status: DC

## 2013-12-26 NOTE — Telephone Encounter (Signed)
The patient wants to come in to give a urine sample . Please advise . No available appointments.

## 2013-12-26 NOTE — Assessment & Plan Note (Signed)
Symptoms and UA consistent with UTI. Will send urine for culture. Will start Keflex. Continue increased fluid intake and Azo. Pt will call if symptoms are not improving.

## 2013-12-26 NOTE — Telephone Encounter (Signed)
Pt was scheduled for today

## 2013-12-26 NOTE — Progress Notes (Signed)
   Subjective:    Patient ID: Amber Oliver, female    DOB: 10-Sep-1950, 63 y.o.   MRN: [id number]  HPI 63YO female presents for acute visit.  Complains of dysuria and hematuria starting Saturday. No fever or flank pain. Did not take any medication, but drank cranberry juice with some improvement.   Review of Systems  Constitutional: Negative for fever, chills and fatigue.  Gastrointestinal: Negative for nausea, vomiting, abdominal pain, diarrhea, constipation and rectal pain.  Genitourinary: Positive for dysuria, urgency, frequency and hematuria. Negative for flank pain, decreased urine volume, vaginal bleeding, vaginal discharge, difficulty urinating, vaginal pain and pelvic pain.       Objective:    BP 114/74  Pulse 82  Temp(Src) 98.2 F (36.8 C) (Oral)  Wt 165 lb (74.844 kg)  SpO2 95% Physical Exam  Constitutional: She is oriented to person, place, and time. She appears well-developed and well-nourished. No distress.  HENT:  Head: Normocephalic and atraumatic.  Right Ear: External ear normal.  Left Ear: External ear normal.  Nose: Nose normal.  Mouth/Throat: Oropharynx is clear and moist.  Eyes: Conjunctivae are normal. Pupils are equal, round, and reactive to light. Right eye exhibits no discharge. Left eye exhibits no discharge. No scleral icterus.  Neck: Normal range of motion. Neck supple. No tracheal deviation present. No thyromegaly present.  Cardiovascular: Normal rate, regular rhythm, normal heart sounds and intact distal pulses.  Exam reveals no gallop and no friction rub.   No murmur heard. Pulmonary/Chest: Effort normal and breath sounds normal. No accessory muscle usage. Not tachypneic. No respiratory distress. She has no decreased breath sounds. She has no wheezes. She has no rhonchi. She has no rales. She exhibits no tenderness.  Abdominal: There is no tenderness (no CVA tenderness).  Musculoskeletal: Normal range of motion. She exhibits no edema and no  tenderness.  Lymphadenopathy:    She has no cervical adenopathy.  Neurological: She is alert and oriented to person, place, and time. No cranial nerve deficit. She exhibits normal muscle tone. Coordination normal.  Skin: Skin is warm and dry. No rash noted. She is not diaphoretic. No erythema. No pallor.  Psychiatric: She has a normal mood and affect. Her behavior is normal. Judgment and thought content normal.          Assessment & Plan:   Problem List Items Addressed This Visit   UTI (urinary tract infection) - Primary     Symptoms and UA consistent with UTI. Will send urine for culture. Will start Keflex. Continue increased fluid intake and Azo. Pt will call if symptoms are not improving.    Relevant Medications      cephALEXin (KEFLEX) capsule   Other Relevant Orders      Urine culture      POCT urinalysis dipstick (Completed)       Return in about 3 months (around 03/28/2014), or if symptoms worsen or fail to improve, for Recheck of Diabetes.

## 2013-12-26 NOTE — Progress Notes (Signed)
Pre visit review using our clinic review tool, if applicable. No additional management support is needed unless otherwise documented below in the visit note. 

## 2013-12-26 NOTE — Patient Instructions (Signed)
Urinary Tract Infection  Urinary tract infections (UTIs) can develop anywhere along your urinary tract. Your urinary tract is your body's drainage system for removing wastes and extra water. Your urinary tract includes two kidneys, two ureters, a bladder, and a urethra. Your kidneys are a pair of bean-shaped organs. Each kidney is about the size of your fist. They are located below your ribs, one on each side of your spine.  CAUSES  Infections are caused by microbes, which are microscopic organisms, including fungi, viruses, and bacteria. These organisms are so small that they can only be seen through a microscope. Bacteria are the microbes that most commonly cause UTIs.  SYMPTOMS   Symptoms of UTIs may vary by age and gender of the patient and by the location of the infection. Symptoms in young women typically include a frequent and intense urge to urinate and a painful, burning feeling in the bladder or urethra during urination. Older women and men are more likely to be tired, shaky, and weak and have muscle aches and abdominal pain. A fever may mean the infection is in your kidneys. Other symptoms of a kidney infection include pain in your back or sides below the ribs, nausea, and vomiting.  DIAGNOSIS  To diagnose a UTI, your caregiver will ask you about your symptoms. Your caregiver also will ask to provide a urine sample. The urine sample will be tested for bacteria and white blood cells. White blood cells are made by your body to help fight infection.  TREATMENT   Typically, UTIs can be treated with medication. Because most UTIs are caused by a bacterial infection, they usually can be treated with the use of antibiotics. The choice of antibiotic and length of treatment depend on your symptoms and the type of bacteria causing your infection.  HOME CARE INSTRUCTIONS   If you were prescribed antibiotics, take them exactly as your caregiver instructs you. Finish the medication even if you feel better after you  have only taken some of the medication.   Drink enough water and fluids to keep your urine clear or pale yellow.   Avoid caffeine, tea, and carbonated beverages. They tend to irritate your bladder.   Empty your bladder often. Avoid holding urine for long periods of time.   Empty your bladder before and after sexual intercourse.   After a bowel movement, women should cleanse from front to back. Use each tissue only once.  SEEK MEDICAL CARE IF:    You have back pain.   You develop a fever.   Your symptoms do not begin to resolve within 3 days.  SEEK IMMEDIATE MEDICAL CARE IF:    You have severe back pain or lower abdominal pain.   You develop chills.   You have nausea or vomiting.   You have continued burning or discomfort with urination.  MAKE SURE YOU:    Understand these instructions.   Will watch your condition.   Will get help right away if you are not doing well or get worse.  Document Released: 05/14/2005 Document Revised: 02/03/2012 Document Reviewed: 09/12/2011  [employer] Patient Information 2014 [employer]

## 2013-12-28 LAB — URINE CULTURE

## 2014-01-29 ENCOUNTER — Other Ambulatory Visit: Payer: Self-pay | Admitting: Internal Medicine

## 2014-02-01 LAB — HM DIABETES EYE EXAM

## 2014-03-15 ENCOUNTER — Encounter: Payer: Self-pay | Admitting: *Deleted

## 2014-03-21 ENCOUNTER — Telehealth: Payer: Self-pay | Admitting: Internal Medicine

## 2014-03-21 DIAGNOSIS — E119 Type 2 diabetes mellitus without complications: Secondary | ICD-10-CM

## 2014-03-21 NOTE — Telephone Encounter (Signed)
Needing labs put in for the patient's DM follow up

## 2014-03-21 NOTE — Telephone Encounter (Signed)
Please see below note

## 2014-03-21 NOTE — Telephone Encounter (Signed)
Orders placed.

## 2014-03-31 ENCOUNTER — Other Ambulatory Visit (INDEPENDENT_AMBULATORY_CARE_PROVIDER_SITE_OTHER): Payer: BC Managed Care – PPO

## 2014-03-31 DIAGNOSIS — E119 Type 2 diabetes mellitus without complications: Secondary | ICD-10-CM

## 2014-03-31 DIAGNOSIS — I10 Essential (primary) hypertension: Secondary | ICD-10-CM

## 2014-03-31 LAB — COMPREHENSIVE METABOLIC PANEL
ALT: 21 U/L (ref 0–35)
AST: 21 U/L (ref 0–37)
Albumin: 4.4 g/dL (ref 3.5–5.2)
Alkaline Phosphatase: 61 U/L (ref 39–117)
BUN: 20 mg/dL (ref 6–23)
CALCIUM: 10.3 mg/dL (ref 8.4–10.5)
CO2: 26 mEq/L (ref 19–32)
Chloride: 105 mEq/L (ref 96–112)
Creatinine, Ser: 0.7 mg/dL (ref 0.4–1.2)
GFR: 90.95 mL/min (ref >60.00)
Glucose, Bld: 101 mg/dL — ABNORMAL HIGH (ref 70–99)
Potassium: 4.3 mEq/L (ref 3.5–5.1)
Sodium: 139 mEq/L (ref 135–145)
TOTAL PROTEIN: 7.1 g/dL (ref 6.0–8.3)
Total Bilirubin: 0.8 mg/dL (ref 0.2–1.2)

## 2014-03-31 LAB — LIPID PANEL
CHOL/HDL RATIO: 4
Cholesterol: 166 mg/dL (ref 0–200)
HDL: 47.1 mg/dL (ref >39.00)
LDL Cholesterol: 107 mg/dL — ABNORMAL HIGH (ref 0–99)
NonHDL: 118.9
Triglycerides: 60 mg/dL (ref 0.0–149.0)
VLDL: 12 mg/dL (ref 0.0–40.0)

## 2014-03-31 LAB — HEMOGLOBIN A1C: Hgb A1c MFr Bld: 6.1 % (ref 4.6–6.5)

## 2014-04-03 ENCOUNTER — Encounter: Payer: Self-pay | Admitting: Internal Medicine

## 2014-04-03 ENCOUNTER — Ambulatory Visit (INDEPENDENT_AMBULATORY_CARE_PROVIDER_SITE_OTHER): Payer: BC Managed Care – PPO | Admitting: Internal Medicine

## 2014-04-03 VITALS — BP 112/62 | HR 77 | Temp 98.3°F | Ht 64.5 in | Wt 165.5 lb

## 2014-04-03 DIAGNOSIS — R030 Elevated blood-pressure reading, without diagnosis of hypertension: Secondary | ICD-10-CM

## 2014-04-03 DIAGNOSIS — IMO0001 Reserved for inherently not codable concepts without codable children: Secondary | ICD-10-CM

## 2014-04-03 DIAGNOSIS — E119 Type 2 diabetes mellitus without complications: Secondary | ICD-10-CM

## 2014-04-03 DIAGNOSIS — H6123 Impacted cerumen, bilateral: Secondary | ICD-10-CM

## 2014-04-03 DIAGNOSIS — H6120 Impacted cerumen, unspecified ear: Secondary | ICD-10-CM

## 2014-04-03 DIAGNOSIS — E785 Hyperlipidemia, unspecified: Secondary | ICD-10-CM

## 2014-04-03 DIAGNOSIS — R05 Cough: Secondary | ICD-10-CM | POA: Insufficient documentation

## 2014-04-03 DIAGNOSIS — R059 Cough, unspecified: Secondary | ICD-10-CM | POA: Insufficient documentation

## 2014-04-03 LAB — HM DIABETES FOOT EXAM: HM Diabetic Foot Exam: NORMAL

## 2014-04-03 NOTE — Assessment & Plan Note (Signed)
Intermittent cough likely related to seasonal allergies. Exam is normal. Will monitor for now. We discussed that Lisinopril may cause cough, and if symptoms are persistent, we may change to Losartan.

## 2014-04-03 NOTE — Assessment & Plan Note (Signed)
Lab Results  Component Value Date   LDLCALC 107* 03/31/2014   LDL just above goal. Will continue Atorvastatin. Encouraged healthy diet and exercise.

## 2014-04-03 NOTE — Progress Notes (Signed)
Pre visit review using our clinic review tool, if applicable. No additional management support is needed unless otherwise documented below in the visit note. 

## 2014-04-03 NOTE — Progress Notes (Signed)
Subjective:    Patient ID: Amber Oliver, female    DOB: [birth year], 63 y.o.   MRN: [id number]  HPI 63YO female presents for follow up.  DM - BG well controlled. A1c 6.1%. Compliant with medication.  Concerned about wax impaction in both ears. Occasionally feels pressure and has trouble hearing.  Has also had recent cough off and on. Not productive. No chest pain, dyspnea. No nasal congestion, sneezing. Not taking anything for cough.  Review of Systems  Constitutional: Negative for fever, chills, appetite change, fatigue and unexpected weight change.  Eyes: Negative for visual disturbance.  Respiratory: Positive for cough. Negative for shortness of breath.   Cardiovascular: Negative for chest pain and leg swelling.  Gastrointestinal: Negative for abdominal pain.  Skin: Negative for color change and rash.  Hematological: Negative for adenopathy. Does not bruise/bleed easily.  Psychiatric/Behavioral: Negative for dysphoric mood. The patient is not nervous/anxious.        Objective:    BP 112/62  Pulse 77  Temp(Src) 98.3 F (36.8 C) (Oral)  Ht 5' 4.5" (1.638 m)  Wt 165 lb 8 oz (75.07 kg)  BMI 27.98 kg/m2  SpO2 97% Physical Exam  Constitutional: She is oriented to person, place, and time. She appears well-developed and well-nourished. No distress.  HENT:  Head: Normocephalic and atraumatic.  Right Ear: External ear normal.  Left Ear: External ear normal.  Nose: Nose normal.  Mouth/Throat: Oropharynx is clear and moist. No oropharyngeal exudate.  Eyes: Conjunctivae are normal. Pupils are equal, round, and reactive to light. Right eye exhibits no discharge. Left eye exhibits no discharge. No scleral icterus.  Neck: Normal range of motion. Neck supple. No tracheal deviation present. No thyromegaly present.  Cardiovascular: Normal rate, regular rhythm, normal heart sounds and intact distal pulses.  Exam reveals no gallop and no friction rub.   No murmur  heard. Pulmonary/Chest: Effort normal and breath sounds normal. No accessory muscle usage. Not tachypneic. No respiratory distress. She has no decreased breath sounds. She has no wheezes. She has no rhonchi. She has no rales. She exhibits no tenderness.  Musculoskeletal: Normal range of motion. She exhibits no edema and no tenderness.  Lymphadenopathy:    She has no cervical adenopathy.  Neurological: She is alert and oriented to person, place, and time. No cranial nerve deficit. She exhibits normal muscle tone. Coordination normal.  Skin: Skin is warm and dry. No rash noted. She is not diaphoretic. No erythema. No pallor.  Psychiatric: She has a normal mood and affect. Her behavior is normal. Judgment and thought content normal.          Assessment & Plan:   Problem List Items Addressed This Visit     Unprioritized   Cerumen impaction     Warm water lavage today.    Cough     Intermittent cough likely related to seasonal allergies. Exam is normal. Will monitor for now. We discussed that Lisinopril may cause cough, and if symptoms are persistent, we may change to Losartan.    Diabetes type 2, controlled - Primary      Lab Results  Component Value Date   HGBA1C 6.1 03/31/2014   BG well controlled on Metformin. Will continue. Foot exam normal today.    Elevated BP      BP Readings from Last 3 Encounters:  04/03/14 112/62  12/26/13 114/74  10/12/13 131/80   BP well controlled on Lisinopril, however having some occasional cough. We discussed possibly changing to losartan, but she  will hold off for now. She will call if cough recurs.    Hyperlipidemia LDL goal <70      Lab Results  Component Value Date   LDLCALC 107* 03/31/2014   LDL just above goal. Will continue Atorvastatin. Encouraged healthy diet and exercise.        Return in about 6 months (around 10/04/2014) for Recheck of Diabetes.

## 2014-04-03 NOTE — Assessment & Plan Note (Signed)
BP Readings from Last 3 Encounters:  04/03/14 112/62  12/26/13 114/74  10/12/13 131/80   BP well controlled on Lisinopril, however having some occasional cough. We discussed possibly changing to losartan, but she will hold off for now. She will call if cough recurs.

## 2014-04-03 NOTE — Patient Instructions (Signed)
Continue current medications.  Follow up in 6 months and as needed. 

## 2014-04-03 NOTE — Assessment & Plan Note (Signed)
Lab Results  Component Value Date   HGBA1C 6.1 03/31/2014   BG well controlled on Metformin. Will continue. Foot exam normal today.

## 2014-04-03 NOTE — Assessment & Plan Note (Signed)
Warm water lavage today.

## 2014-04-05 ENCOUNTER — Ambulatory Visit: Payer: BC Managed Care – PPO | Admitting: Internal Medicine

## 2014-05-26 ENCOUNTER — Other Ambulatory Visit: Payer: Self-pay | Admitting: Internal Medicine

## 2014-07-04 ENCOUNTER — Encounter: Payer: Self-pay | Admitting: *Deleted

## 2014-07-04 LAB — HM MAMMOGRAPHY: HM Mammogram: NEGATIVE

## 2014-07-19 ENCOUNTER — Encounter: Payer: Self-pay | Admitting: Internal Medicine

## 2014-07-31 ENCOUNTER — Encounter: Payer: Self-pay | Admitting: Internal Medicine

## 2014-08-02 ENCOUNTER — Ambulatory Visit (INDEPENDENT_AMBULATORY_CARE_PROVIDER_SITE_OTHER)
Admission: RE | Admit: 2014-08-02 | Discharge: 2014-08-02 | Disposition: A | Discharge status: Home / Self Care | Payer: BC Managed Care – PPO | Source: Ambulatory Visit | Attending: Internal Medicine | Admitting: Internal Medicine

## 2014-08-02 ENCOUNTER — Ambulatory Visit (INDEPENDENT_AMBULATORY_CARE_PROVIDER_SITE_OTHER): Payer: BC Managed Care – PPO | Admitting: Internal Medicine

## 2014-08-02 ENCOUNTER — Encounter: Payer: Self-pay | Admitting: Internal Medicine

## 2014-08-02 VITALS — BP 114/69 | HR 89 | Temp 98.7°F | Ht 64.5 in | Wt 167.0 lb

## 2014-08-02 DIAGNOSIS — J209 Acute bronchitis, unspecified: Secondary | ICD-10-CM

## 2014-08-02 LAB — POCT INFLUENZA A/B
INFLUENZA A, POC: NEGATIVE
INFLUENZA B, POC: NEGATIVE

## 2014-08-02 MED ORDER — AMOXICILLIN-POT CLAVULANATE 875-125 MG PO TABS: 1.0000 | ORAL_TABLET | Freq: Two times a day (BID) | ORAL | Status: DC

## 2014-08-02 MED ORDER — HYDROCOD POLST-CHLORPHEN POLST 10-8 MG/5ML PO LQCR: 5.0000 mL | Freq: Two times a day (BID) | ORAL | Status: DC | PRN

## 2014-08-02 NOTE — Progress Notes (Signed)
Pre visit review using our clinic review tool, if applicable. No additional management support is needed unless otherwise documented below in the visit note. 

## 2014-08-02 NOTE — Patient Instructions (Signed)
Start Augmentin twice daily to help treat bronchitis.  Start Tussionex up to twice daily for cough.  Chest xray today at [facility]  Recheck in 1 week. Email with update Thursday or Friday.

## 2014-08-02 NOTE — Progress Notes (Signed)
Subjective:    Patient ID: Nya Monds, female    DOB: 28-Nov-1950, 63 y.o.   MRN: [id number]  HPI 63YO female presents for acute visit.  Chronic dry cough off and on for months. Then Monday night, developed fever 101F, chills. Feels exhausted and weak. Fever has resolved. However feels more short of breath and "tight" in chest. Taking some Theraflu with minimal improvement. Non-smoker, however exposed to second hand smoke from her daughter.   Past medical, surgical, family and social history per today's encounter.  Review of Systems  Constitutional: Positive for fever, chills and fatigue. Negative for unexpected weight change.  HENT: Positive for congestion, postnasal drip and rhinorrhea. Negative for ear discharge, ear pain, facial swelling, hearing loss, mouth sores, nosebleeds, sinus pressure, sneezing, sore throat, tinnitus, trouble swallowing and voice change.   Eyes: Negative for pain, discharge, redness and visual disturbance.  Respiratory: Positive for cough, chest tightness and shortness of breath. Negative for wheezing and stridor.   Cardiovascular: Negative for chest pain, palpitations and leg swelling.  Musculoskeletal: Negative for myalgias, arthralgias, neck pain and neck stiffness.  Skin: Negative for color change and rash.  Neurological: Negative for dizziness, weakness, light-headedness and headaches.  Hematological: Negative for adenopathy.       Objective:    BP 114/69 mmHg  Pulse 89  Temp(Src) 98.7 F (37.1 C) (Oral)  Ht 5' 4.5" (1.638 m)  Wt 167 lb (75.751 kg)  BMI 28.23 kg/m2  SpO2 98% Physical Exam  Constitutional: She is oriented to person, place, and time. She appears well-developed and well-nourished. No distress.  HENT:  Head: Normocephalic and atraumatic.  Right Ear: External ear normal.  Left Ear: External ear normal.  Nose: Nose normal.  Mouth/Throat: Oropharynx is clear and moist. No oropharyngeal exudate.  Eyes: Conjunctivae are normal.  Pupils are equal, round, and reactive to light. Right eye exhibits no discharge. Left eye exhibits no discharge. No scleral icterus.  Neck: Normal range of motion. Neck supple. No tracheal deviation present. No thyromegaly present.  Cardiovascular: Normal rate, regular rhythm, normal heart sounds and intact distal pulses.  Exam reveals no gallop and no friction rub.   No murmur heard. Pulmonary/Chest: Effort normal. No accessory muscle usage. No tachypnea. No respiratory distress. She has no decreased breath sounds. She has no wheezes. She has rhonchi (scattered). She has no rales. She exhibits no tenderness.  Musculoskeletal: Normal range of motion. She exhibits no edema or tenderness.  Lymphadenopathy:    She has no cervical adenopathy.  Neurological: She is alert and oriented to person, place, and time. No cranial nerve deficit. She exhibits normal muscle tone. Coordination normal.  Skin: Skin is warm and dry. No rash noted. She is not diaphoretic. No erythema. No pallor.  Psychiatric: She has a normal mood and affect. Her behavior is normal. Judgment and thought content normal.          Assessment & Plan:   Problem List Items Addressed This Visit      Unprioritized   Acute bronchitis - Primary    Symptoms most consistent with acute bronchitis. Will get CXR today. Start Augmentin bid. Encouraged her to take probiotic yogurt with this. Start Tussionex as needed for cough. Discussed adding Prednisone if symptoms are not improving.Follow up in 1 week and sooner as needed. She will email with update Thursday or Friday.    Relevant Medications      amoxicillin-clavulanate (AUGMENTIN) 875-125 MG per tablet      chlorpheniramine-HYDROcodone (TUSSIONEX) suspension 8-10  mg/60mL   Other Relevant Orders      DG Chest 2 View      POCT Influenza A/B (Completed)       Return in about 1 week (around 08/09/2014) for Recheck.

## 2014-08-02 NOTE — Assessment & Plan Note (Addendum)
Symptoms most consistent with acute bronchitis. Will get CXR today. Start Augmentin bid. Encouraged her to take probiotic yogurt with this. Start Tussionex as needed for cough. Discussed adding Prednisone if symptoms are not improving.Follow up in 1 week and sooner as needed. She will email with update Thursday or Friday.

## 2014-08-04 ENCOUNTER — Encounter: Payer: Self-pay | Admitting: Internal Medicine

## 2014-08-04 ENCOUNTER — Other Ambulatory Visit: Payer: Self-pay | Admitting: Internal Medicine

## 2014-08-07 ENCOUNTER — Encounter: Payer: Self-pay | Admitting: Internal Medicine

## 2014-08-08 ENCOUNTER — Ambulatory Visit (INDEPENDENT_AMBULATORY_CARE_PROVIDER_SITE_OTHER): Payer: BC Managed Care – PPO | Admitting: Internal Medicine

## 2014-08-08 ENCOUNTER — Ambulatory Visit (INDEPENDENT_AMBULATORY_CARE_PROVIDER_SITE_OTHER)
Admission: RE | Admit: 2014-08-08 | Discharge: 2014-08-08 | Disposition: A | Discharge status: Home / Self Care | Payer: BC Managed Care – PPO | Source: Ambulatory Visit | Attending: Internal Medicine | Admitting: Internal Medicine

## 2014-08-08 ENCOUNTER — Encounter: Payer: Self-pay | Admitting: Internal Medicine

## 2014-08-08 VITALS — BP 114/71 | HR 82 | Temp 97.8°F | Ht 64.5 in | Wt 165.0 lb

## 2014-08-08 DIAGNOSIS — J209 Acute bronchitis, unspecified: Secondary | ICD-10-CM

## 2014-08-08 MED ORDER — BENZONATATE 200 MG PO CAPS: 200.0000 mg | ORAL_CAPSULE | Freq: Two times a day (BID) | ORAL | Status: DC | PRN

## 2014-08-08 MED ORDER — ALBUTEROL SULFATE HFA 108 (90 BASE) MCG/ACT IN AERS: 2.0000 | INHALATION_SPRAY | Freq: Four times a day (QID) | RESPIRATORY_TRACT | Status: DC | PRN

## 2014-08-08 MED ORDER — AZITHROMYCIN 250 MG PO TABS: ORAL_TABLET | ORAL | Status: DC

## 2014-08-08 MED ORDER — PREDNISONE 10 MG PO TABS: ORAL_TABLET | ORAL | Status: DC

## 2014-08-08 NOTE — Progress Notes (Signed)
Subjective:    Patient ID: Amber Oliver, female    DOB: 1950/11/28, 63 y.o.   MRN: [id number]  HPI 63YO female presents for follow up bronchitis.  12/16 seen for urgent visit. Diagnosed with bronchitis. Treated with Augmentin. CXR showed no acute process.  Saturday, had some improvement with less cough and improved energy, however then symptoms returned. Felt exhausted Saturday night. Coughing at night preventing her from sleeping. Tussionex not that helpful. No dyspnea. No fever. No h/o asthma. No smoke exposure.    Past medical, surgical, family and social history per today's encounter.  Review of Systems  Constitutional: Positive for fatigue. Negative for fever, chills and unexpected weight change.  HENT: Positive for postnasal drip. Negative for congestion, ear discharge, ear pain, facial swelling, hearing loss, mouth sores, nosebleeds, rhinorrhea, sinus pressure, sneezing, sore throat, tinnitus, trouble swallowing and voice change.   Eyes: Negative for pain, discharge, redness and visual disturbance.  Respiratory: Positive for cough, chest tightness, shortness of breath and wheezing. Negative for stridor.   Cardiovascular: Negative for chest pain, palpitations and leg swelling.  Musculoskeletal: Negative for myalgias, arthralgias, neck pain and neck stiffness.  Skin: Negative for color change and rash.  Neurological: Negative for dizziness, weakness, light-headedness and headaches.  Hematological: Negative for adenopathy.  Psychiatric/Behavioral: Positive for sleep disturbance.       Objective:    BP 114/71 mmHg  Pulse 82  Temp(Src) 97.8 F (36.6 C) (Oral)  Ht 5' 4.5" (1.638 m)  Wt 165 lb (74.844 kg)  BMI 27.90 kg/m2  SpO2 98% Physical Exam  Constitutional: She is oriented to person, place, and time. She appears well-developed and well-nourished. No distress.  HENT:  Head: Normocephalic and atraumatic.  Right Ear: External ear normal.  Left Ear: External ear  normal.  Nose: Nose normal.  Mouth/Throat: Oropharynx is clear and moist. No oropharyngeal exudate.  Eyes: Conjunctivae are normal. Pupils are equal, round, and reactive to light. Right eye exhibits no discharge. Left eye exhibits no discharge. No scleral icterus.  Neck: Normal range of motion. Neck supple. No tracheal deviation present. No thyromegaly present.  Cardiovascular: Normal rate, regular rhythm, normal heart sounds and intact distal pulses.  Exam reveals no gallop and no friction rub.   No murmur heard. Pulmonary/Chest: Effort normal. No accessory muscle usage. No tachypnea. No respiratory distress. She has decreased breath sounds (decreased air movement). She has wheezes. She has rhonchi (scattered). She has no rales. She exhibits no tenderness.  Musculoskeletal: Normal range of motion. She exhibits no edema or tenderness.  Lymphadenopathy:    She has no cervical adenopathy.  Neurological: She is alert and oriented to person, place, and time. No cranial nerve deficit. She exhibits normal muscle tone. Coordination normal.  Skin: Skin is warm and dry. No rash noted. She is not diaphoretic. No erythema. No pallor.  Psychiatric: She has a normal mood and affect. Her behavior is normal. Judgment and thought content normal.          Assessment & Plan:   Problem List Items Addressed This Visit      Unprioritized   Acute bronchitis - Primary    Symptoms and exam consistent with persistent bronchitis. Will add Prednisone taper. Will add Azithromycin for atypical coverage. Add Tessalon during the day for cough. Continue Tussionex as needed at night. Albuterol prn wheezing or dyspnea. Repeat chest xray today. Recheck on Thursday and prn.    Relevant Medications      predniSONE (DELTASONE) tablet  azithromycin (ZITHROMAX) tablet      benzonatate (TESSALON) capsule   Other Relevant Orders      DG Chest 2 View       Return in about 2 days (around 08/10/2014) for Recheck.

## 2014-08-08 NOTE — Patient Instructions (Signed)
Start Azithromycin and Prednisone taper. Finish Augmentin.  Repeat chest xray today.  Follow up on Thursday.

## 2014-08-08 NOTE — Assessment & Plan Note (Signed)
Symptoms and exam consistent with persistent bronchitis. Will add Prednisone taper. Will add Azithromycin for atypical coverage. Add Tessalon during the day for cough. Continue Tussionex as needed at night. Albuterol prn wheezing or dyspnea. Repeat chest xray today. Recheck on Thursday and prn.

## 2014-08-08 NOTE — Progress Notes (Signed)
Pre visit review using our clinic review tool, if applicable. No additional management support is needed unless otherwise documented below in the visit note. 

## 2014-08-10 ENCOUNTER — Ambulatory Visit: Payer: BC Managed Care – PPO | Admitting: Internal Medicine

## 2014-08-20 ENCOUNTER — Encounter: Payer: Self-pay | Admitting: Internal Medicine

## 2014-09-08 ENCOUNTER — Encounter: Payer: Self-pay | Admitting: Internal Medicine

## 2014-09-12 ENCOUNTER — Ambulatory Visit: Payer: Self-pay | Admitting: Internal Medicine

## 2014-09-12 ENCOUNTER — Ambulatory Visit (INDEPENDENT_AMBULATORY_CARE_PROVIDER_SITE_OTHER): Payer: BC Managed Care – PPO | Admitting: Internal Medicine

## 2014-09-12 ENCOUNTER — Encounter: Payer: Self-pay | Admitting: Internal Medicine

## 2014-09-12 VITALS — BP 124/70 | HR 80 | Temp 97.9°F | Ht 64.5 in | Wt 168.8 lb

## 2014-09-12 DIAGNOSIS — R059 Cough, unspecified: Secondary | ICD-10-CM

## 2014-09-12 DIAGNOSIS — R05 Cough: Secondary | ICD-10-CM

## 2014-09-12 MED ORDER — HYDROCOD POLST-CHLORPHEN POLST 10-8 MG/5ML PO LQCR: 5.0000 mL | Freq: Two times a day (BID) | ORAL | Status: DC | PRN

## 2014-09-12 NOTE — Assessment & Plan Note (Signed)
Persistent dry cough x over 3 months. No improvement with trial off Lisinopril. No improvement after treatment for bronchitis in 07/2014 with Augmentin, Azithromycin, and Prednisone taper. Exam today remarkable for crackles LLL. Will set up Chest CT for further evaluation. Will also set up pulmonary evaluation. Question if she may have atypical infection causing symptoms. Nothing to suggest GERD and no wheezing or prolonged expiration on exam to suggest asthma. Follow up in 4 weeks.

## 2014-09-12 NOTE — Progress Notes (Signed)
Subjective:    Patient ID: Amber Oliver, female    DOB: 18-Dec-1950, 64 y.o.   MRN: [id number]  HPI 64YO female presents for acute visit.  Last seen 12/22 for bronchitis - Treated with Augmentin, Azithromycin and Prednisone for bronchitis. CXR Showed increased markings consistent with pneumonitis.  Continues to have intermittent cough. Some days, may go with no cough. Other days, coughs all day. No fever, chills. Cough is dry generally. No chest pain. No dyspnea. No travel. Tried stopping Lisinopril with no improvement. Cold sometimes seems to make cold worse. No sneezing. No congestion.  Daughter smokes but not in home. Granddaughter recently had RSV and lives with pt. Pt has history of pneumonia as a child.   Past medical, surgical, family and social history per today's encounter.  Review of Systems  Constitutional: Negative for fever, chills and unexpected weight change.  HENT: Negative for congestion, ear discharge, ear pain, facial swelling, hearing loss, mouth sores, nosebleeds, postnasal drip, rhinorrhea, sinus pressure, sneezing, sore throat, tinnitus, trouble swallowing and voice change.   Eyes: Negative for pain, discharge, redness and visual disturbance.  Respiratory: Positive for cough. Negative for chest tightness, shortness of breath, wheezing and stridor.   Cardiovascular: Negative for chest pain, palpitations and leg swelling.  Musculoskeletal: Negative for myalgias, arthralgias, neck pain and neck stiffness.  Skin: Negative for color change and rash.  Neurological: Negative for dizziness, weakness, light-headedness and headaches.  Hematological: Negative for adenopathy.  Psychiatric/Behavioral: Positive for sleep disturbance.       Objective:    BP 124/70 mmHg  Pulse 80  Temp(Src) 97.9 F (36.6 C) (Oral)  Ht 5' 4.5" (1.638 m)  Wt 168 lb 12 oz (76.544 kg)  BMI 28.53 kg/m2  SpO2 99% Physical Exam  Constitutional: She is oriented to person, place, and time.  She appears well-developed and well-nourished. No distress.  HENT:  Head: Normocephalic and atraumatic.  Right Ear: External ear normal.  Left Ear: External ear normal.  Nose: Nose normal.  Mouth/Throat: Oropharynx is clear and moist. No oropharyngeal exudate.  Eyes: Conjunctivae are normal. Pupils are equal, round, and reactive to light. Right eye exhibits no discharge. Left eye exhibits no discharge. No scleral icterus.  Neck: Normal range of motion. Neck supple. No tracheal deviation present. No thyromegaly present.  Cardiovascular: Normal rate, regular rhythm, normal heart sounds and intact distal pulses.  Exam reveals no gallop and no friction rub.   No murmur heard. Pulmonary/Chest: Effort normal. No accessory muscle usage. No tachypnea. No respiratory distress. She has no decreased breath sounds. She has no wheezes. She has no rhonchi. She has rales in the left middle field and the left lower field. She exhibits no tenderness.  Musculoskeletal: Normal range of motion. She exhibits no edema or tenderness.  Lymphadenopathy:    She has no cervical adenopathy.  Neurological: She is alert and oriented to person, place, and time. No cranial nerve deficit. She exhibits normal muscle tone. Coordination normal.  Skin: Skin is warm and dry. No rash noted. She is not diaphoretic. No erythema. No pallor.  Psychiatric: She has a normal mood and affect. Her behavior is normal. Judgment and thought content normal.          Assessment & Plan:  Over 71min of which >50% spent in face-to-face contact with patient discussing plan of care  Problem List Items Addressed This Visit      Unprioritized   Cough - Primary    Persistent dry cough x over 3 months.  No improvement with trial off Lisinopril. No improvement after treatment for bronchitis in 07/2014 with Augmentin, Azithromycin, and Prednisone taper. Exam today remarkable for crackles LLL. Will set up Chest CT for further evaluation. Will also  set up pulmonary evaluation. Question if she may have atypical infection causing symptoms. Nothing to suggest GERD and no wheezing or prolonged expiration on exam to suggest asthma. Follow up in 4 weeks.      Relevant Orders   CT Chest Wo Contrast   Ambulatory referral to Pulmonology       Return in about 4 weeks (around 10/10/2014) for Recheck.

## 2014-09-12 NOTE — Patient Instructions (Addendum)
Use Tussionex as needed for cough at night.  We will set up evaluation with pulmonary medicine to help determine cause of cough.  We will also set up CT Chest for further evaluation.  Follow up here in 4 weeks.

## 2014-09-12 NOTE — Progress Notes (Signed)
Pre visit review using our clinic review tool, if applicable. No additional management support is needed unless otherwise documented below in the visit note. 

## 2014-09-13 ENCOUNTER — Telehealth: Payer: Self-pay | Admitting: Internal Medicine

## 2014-09-13 ENCOUNTER — Encounter: Payer: Self-pay | Admitting: Internal Medicine

## 2014-09-13 DIAGNOSIS — I2510 Atherosclerotic heart disease of native coronary artery without angina pectoris: Secondary | ICD-10-CM

## 2014-09-13 NOTE — Telephone Encounter (Signed)
CT Chest showed no findings in the lung to explain cough. CT did show coronary artery calcifications. I would like to set up cardiology evaluation for possible stress test given these findings. Please ask if that is okay with her.

## 2014-09-13 NOTE — Telephone Encounter (Signed)
Notified pt, Pt is agreeable to seeing cardiology

## 2014-09-15 ENCOUNTER — Ambulatory Visit: Payer: BC Managed Care – PPO | Admitting: Internal Medicine

## 2014-09-21 ENCOUNTER — Encounter: Payer: Self-pay | Admitting: Internal Medicine

## 2014-09-22 ENCOUNTER — Other Ambulatory Visit: Payer: Self-pay | Admitting: Internal Medicine

## 2014-09-27 ENCOUNTER — Encounter: Payer: Self-pay | Admitting: *Deleted

## 2014-09-27 ENCOUNTER — Ambulatory Visit (INDEPENDENT_AMBULATORY_CARE_PROVIDER_SITE_OTHER): Payer: BC Managed Care – PPO | Admitting: Internal Medicine

## 2014-09-27 VITALS — BP 140/76 | HR 94 | Temp 97.8°F | Ht 64.5 in | Wt 169.4 lb

## 2014-09-27 DIAGNOSIS — R059 Cough, unspecified: Secondary | ICD-10-CM

## 2014-09-27 DIAGNOSIS — R05 Cough: Secondary | ICD-10-CM

## 2014-09-27 NOTE — Progress Notes (Signed)
Date: 09/27/2014  MRN# [id number] Amber Oliver [birth year]  Referring Physician: Dr. Candie Chroman  Amber Oliver is a 64 y.o. old female seen in consultation for cough  CC: "cough" Chief Complaint  Patient presents with  . Advice Only    Pt referred by Dr. Gilford Rile for cough. Pt c/o cough ranging from dry, hacking to productive clear mucus/ yellow. She has chest tightness, no wheezing or sob.    HPI:  Cough started summer 2015, has had 3 episodes of "cold" in the past year.  In December 2015 diagnosed with bronchitis by PMD, got antibiotics, prednisone and cough suppressants (symptoms were cough-yellow sputum, fever, body aches, chills), negative flu test. Has a history of childhood respiratory infections out grew around 36-69 years of age. Usually has one "cold" per year, treated with supportive care mostly. Cough occurs at anytime, patient cannot recall any exacerbating factors, currently with dry cough. She does not endorse coughing everyday or cough spells.  Describes the cough as a smokers cough.  Cough drops and cough suppressants do help at times along with prednisone, but do not eliminate the cough. Currently no fever, runny, chills, no wheezes, no shortness of breath.  No significant second-hand exposure No exotic pets at home. Currently tussionex helps with the cough.   Review of records shows that a trial off of lisinopril, erythromycin, Augmentin, prednisone, cough suppressant, and other home remedies over the past 3 months which is not eliminate her cough. She recently had a chest CT done on 09/12/2014, showed no acute pulmonary processes.  She states that she has grandkids that lives with her and over the past one to 2 months they have had various upper respiratory tract infections (RSV). Patient has a daughter that is a smoker, but does not smoke in the house.  PMHX:   Past Medical History  Diagnosis Date  . Hyperlipidemia   . Diabetes mellitus   . Kidney stone     Surgical Hx:  Past Surgical History  Procedure Laterality Date  . Trigger finger release     Family Hx:  Family History  Problem Relation Age of Onset  . Heart disease Mother   . Diabetes Father   . Heart disease Father   . Diabetes Sister   . Diabetes Brother   . Cancer Maternal Grandmother     breast   Social Hx:   History  Substance Use Topics  . Smoking status: Former Smoker -- 0.25 packs/day for 20 years    Types: Cigarettes    Quit date: 05/09/1999  . Smokeless tobacco: Never Used  . Alcohol Use: No   Medication:   Current Outpatient Rx  Name  Route  Sig  Dispense  Refill  . ACCU-CHEK AVIVA PLUS test strip      USE ONE STRIP TO CHECK GLUCOSE TWICE DAILY   100 each   3   . albuterol (PROVENTIL HFA;VENTOLIN HFA) 108 (90 BASE) MCG/ACT inhaler   Inhalation   Inhale 2 puffs into the lungs every 6 (six) hours as needed for wheezing or shortness of breath.   1 Inhaler   0   . atorvastatin (LIPITOR) 20 MG tablet      TAKE ONE TABLET BY MOUTH ONCE DAILY   90 tablet   1   . chlorpheniramine-HYDROcodone (TUSSIONEX) 10-8 MG/5ML LQCR   Oral   Take 5 mLs by mouth every 12 (twelve) hours as needed.   140 mL   0   . cholecalciferol (VITAMIN D) 1000 UNITS tablet  Oral   Take 1,000 Units by mouth 2 (two) times daily.           . fish oil-omega-3 fatty acids 1000 MG capsule   Oral   Take 2 g by mouth daily. MEGA RED BRAND          . lisinopril (PRINIVIL,ZESTRIL) 5 MG tablet      TAKE ONE TABLET BY MOUTH ONCE DAILY   30 tablet   5   . metFORMIN (GLUCOPHAGE) 500 MG tablet      TAKE ONE TABLET BY MOUTH TWICE DAILY WITH MEALS   60 tablet   5       Allergies:  Ciprofloxacin; Influenza vaccines; and Sulfa drugs cross reactors  Review of Systems: Gen:  Denies  fever, sweats, chills HEENT: Denies blurred vision, double vision, ear pain, eye pain, hearing loss, nose bleeds, sore throat Cvc:  No dizziness, chest pain or heaviness Resp:    Cough-dry Gi: Denies swallowing difficulty, stomach pain, nausea or vomiting, diarrhea, constipation, bowel incontinence Gu:  Denies bladder incontinence, burning urine Ext:   No Joint pain, stiffness or swelling Skin: No skin rash, easy bruising or bleeding or hives Endoc:  No polyuria, polydipsia , polyphagia or weight change Psych: No depression, insomnia or hallucinations  Other:  All other systems negative  Physical Examination:   VS: BP 140/76 mmHg  Pulse 94  Temp(Src) 97.8 F (36.6 C) (Oral)  Ht 5' 4.5" (1.638 m)  Wt 169 lb 6.4 oz (76.839 kg)  BMI 28.64 kg/m2  SpO2 96%  General Appearance: No distress  Neuro:without focal findings, mental status, speech normal, alert and oriented, cranial nerves 2-12 intact, reflexes normal and symmetric, sensation grossly normal  HEENT: PERRLA, EOM intact, no ptosis, no other lesions noticed; Mallampati 1 Pulmonary: normal breath sounds., diaphragmatic excursion normal.No wheezing, No rales;   Sputum Production:  none CardiovascularNormal S1,S2.  No m/r/g.  Abdominal aorta pulsation normal.    Abdomen: Benign, Soft, non-tender, No masses, hepatosplenomegaly, No lymphadenopathy Renal:  No costovertebral tenderness  GU:  No performed at this time. Endoc: No evident thyromegaly, no signs of acromegaly or Cushing features Skin:   warm, no rashes, no ecchymosis  Extremities: normal, no cyanosis, clubbing, no edema, warm with normal capillary refill. Other findings:   Labs results:   Rad results: (The following images and results were reviewed by Dr. Stevenson Clinch). CXR 08/08/14 FINDINGS: Mediastinum and hilar structures are normal. Increased interstitial markings in the left lung base consistent with pneumonitis. Right lung is clear. Heart size normal. No acute bony abnormality.  IMPRESSION: Increased interstitial markings left lung base consistent with pneumonitis.  CXR 08/02/14 FINDINGS: Linear densities in the left lung base, likely  scarring. No confluent opacity on the right. No effusions. Heart and mediastinal contours are within normal limits. No acute bony abnormality.  IMPRESSION: Left basilar scarring. No active disease.   CT Chest 09/12/14 FINDINGS: The central airways are patent. The lungs are clear. There is no bronchiectasis. There is no pleural effusion or pneumothorax.  There are no pathologically enlarged axillary, hilar or mediastinal lymph nodes.  The heart size is normal. There is no pericardial effusion. The thoracic aorta is normal in caliber. There is coronary artery atherosclerosis in the LAD, circumflex and RCA. There is thoracic aortic atherosclerosis.  Review of bone windows demonstrates no focal lytic or sclerotic lesions.  Limited non-contrast images of the upper abdomen were obtained. The adrenal glands appear normal. The remainder of the visualized abdominal organs are unremarkable.  IMPRESSION: 1. No acute cardiopulmonary disease. 2. Multivessel coronary artery atherosclerosis.    Assessment and Plan: Cough Cough - intermittent over the past 3 months. Last respiratory infection in mid December putting her about 8 weeks out Lisinopril was stopped for a short period of time with no relief No major relief with albuterol  Differential Diagnosis - post infectious, obstructive vs restrictive disease, COPD, prior smoking history, GERD.    The standardized cough guidelines published in Chest by Lissa Morales in 2006 are still the best available and consist of a multiple step process (up to 12!) , not a single office visit,  and are intended  to address this problem logically,  with an alogrithm dependent on response to empiric treatment at  each progressive step  to determine a specific diagnosis with  minimal addtional testing needed. Therefore if adherence is an issue or can't be accurately verified,  it's very unlikely the standard evaluation and treatment will be successful  here.    Furthermore, response to therapy (other than acute cough suppression, which should only be used short term with avoidance of narcotic containing cough syrups if possible), can be a gradual process for which the patient may perceive immediate benefit.  Plan  - pulmonary functions testing - 6 minute walk test - I have reviewed the CT Chest with Dr. Rosario Jacks - no acute process, there is some mild atelectasis and scaring in the right lower lobe - this does do not correlate to her cough.      Updated Medication List Outpatient Encounter Prescriptions as of 09/27/2014  Medication Sig  . ACCU-CHEK AVIVA PLUS test strip USE ONE STRIP TO CHECK GLUCOSE TWICE DAILY  . albuterol (PROVENTIL HFA;VENTOLIN HFA) 108 (90 BASE) MCG/ACT inhaler Inhale 2 puffs into the lungs every 6 (six) hours as needed for wheezing or shortness of breath.  Marland Kitchen atorvastatin (LIPITOR) 20 MG tablet TAKE ONE TABLET BY MOUTH ONCE DAILY  . chlorpheniramine-HYDROcodone (TUSSIONEX) 10-8 MG/5ML LQCR Take 5 mLs by mouth every 12 (twelve) hours as needed.  . cholecalciferol (VITAMIN D) 1000 UNITS tablet Take 1,000 Units by mouth 2 (two) times daily.    . fish oil-omega-3 fatty acids 1000 MG capsule Take 2 g by mouth daily. MEGA RED BRAND   . lisinopril (PRINIVIL,ZESTRIL) 5 MG tablet TAKE ONE TABLET BY MOUTH ONCE DAILY  . metFORMIN (GLUCOPHAGE) 500 MG tablet TAKE ONE TABLET BY MOUTH TWICE DAILY WITH MEALS    Orders for this visit: No orders of the defined types were placed in this encounter.     Thank  you for the consultation and for allowing [facility] to assist in the care of your patient. Our recommendations are noted above.  Please contact us if we can be of further service.   Vilinda Boehringer, MD [facility] Office Number: [phone]

## 2014-09-27 NOTE — Assessment & Plan Note (Addendum)
Cough - intermittent over the past 3 months. Last respiratory infection in mid December putting her about 8 weeks out Lisinopril was stopped for a short period of time with no relief No major relief with albuterol  Differential Diagnosis - post infectious, obstructive vs restrictive disease, COPD, prior smoking history, GERD.    The standardized cough guidelines published in Chest by Lissa Morales in 2006 are still the best available and consist of a multiple step process (up to 12!) , not a single office visit,  and are intended  to address this problem logically,  with an alogrithm dependent on response to empiric treatment at  each progressive step  to determine a specific diagnosis with  minimal addtional testing needed. Therefore if adherence is an issue or can't be accurately verified,  it's very unlikely the standard evaluation and treatment will be successful here.    Furthermore, response to therapy (other than acute cough suppression, which should only be used short term with avoidance of narcotic containing cough syrups if possible), can be a gradual process for which the patient may perceive immediate benefit.  Plan  - pulmonary functions testing - 6 minute walk test - I have reviewed the CT Chest with Dr. Rosario Jacks - no acute process, there is some mild atelectasis and scaring in the right lower lobe - this does do not correlate to her cough.

## 2014-09-27 NOTE — Patient Instructions (Signed)
Continue with cough suppressant has prescribed by your PMD.  Follow up with Dr. Stevenson Clinch in 2 weeks - pulmonary function testing, and 6 minute walk test prior to follow up.

## 2014-10-05 ENCOUNTER — Encounter: Payer: Self-pay | Admitting: Cardiovascular Disease

## 2014-10-05 ENCOUNTER — Ambulatory Visit (INDEPENDENT_AMBULATORY_CARE_PROVIDER_SITE_OTHER): Payer: BC Managed Care – PPO | Admitting: Cardiovascular Disease

## 2014-10-05 VITALS — BP 106/78 | HR 80 | Ht 64.5 in | Wt 168.8 lb

## 2014-10-05 DIAGNOSIS — R0602 Shortness of breath: Secondary | ICD-10-CM

## 2014-10-05 DIAGNOSIS — R030 Elevated blood-pressure reading, without diagnosis of hypertension: Secondary | ICD-10-CM

## 2014-10-05 DIAGNOSIS — IMO0001 Reserved for inherently not codable concepts without codable children: Secondary | ICD-10-CM

## 2014-10-05 DIAGNOSIS — E785 Hyperlipidemia, unspecified: Secondary | ICD-10-CM

## 2014-10-05 DIAGNOSIS — I2510 Atherosclerotic heart disease of native coronary artery without angina pectoris: Secondary | ICD-10-CM | POA: Insufficient documentation

## 2014-10-05 MED ORDER — ASPIRIN 81 MG PO TABS: 81.0000 mg | ORAL_TABLET | Freq: Every day | ORAL | Status: DC

## 2014-10-05 NOTE — Progress Notes (Signed)
Primary care physician: Dr. Anderson Malta walker  HPI  This is a pleasant 64 year old female who was referred for evaluation of coronary calcifications noted on CT scan. She had routine CT scan of the chest recently for cough and was noted to have coronary calcifications on the 3 coronary arteries. She denies any previous cardiac history. She has known history of type 2 diabetes of at least 5 years of duration, hyperlipidemia and hypertension. She is not a smoker. She does have family history of coronary artery disease but not prematurely. She denies any chest discomfort. She does complain of chronic exertional dyspnea with slight worsening recently. No orthopnea, PND or lower extremity edema. No palpitations, syncope or presyncope. No previous cardiac evaluation.  Allergies  Allergen Reactions  . Ciprofloxacin     swelling  . Influenza Vaccines     Nausea, vomiting  . Sulfa Drugs Cross Reactors      Current Outpatient Prescriptions on File Prior to Visit  Medication Sig Dispense Refill  . ACCU-CHEK AVIVA PLUS test strip USE ONE STRIP TO CHECK GLUCOSE TWICE DAILY 100 each 3  . albuterol (PROVENTIL HFA;VENTOLIN HFA) 108 (90 BASE) MCG/ACT inhaler Inhale 2 puffs into the lungs every 6 (six) hours as needed for wheezing or shortness of breath. 1 Inhaler 0  . atorvastatin (LIPITOR) 20 MG tablet TAKE ONE TABLET BY MOUTH ONCE DAILY 90 tablet 1  . chlorpheniramine-HYDROcodone (TUSSIONEX) 10-8 MG/5ML LQCR Take 5 mLs by mouth every 12 (twelve) hours as needed. 140 mL 0  . cholecalciferol (VITAMIN D) 1000 UNITS tablet Take 1,000 Units by mouth 2 (two) times daily.      . fish oil-omega-3 fatty acids 1000 MG capsule Take 2 g by mouth daily. MEGA RED BRAND     . lisinopril (PRINIVIL,ZESTRIL) 5 MG tablet TAKE ONE TABLET BY MOUTH ONCE DAILY 30 tablet 5  . metFORMIN (GLUCOPHAGE) 500 MG tablet TAKE ONE TABLET BY MOUTH TWICE DAILY WITH MEALS 60 tablet 5   No current facility-administered medications on file  prior to visit.     Past Medical History  Diagnosis Date  . Hyperlipidemia   . Diabetes mellitus   . Kidney stone   . Hypertension      Past Surgical History  Procedure Laterality Date  . Trigger finger release       Family History  Problem Relation Age of Onset  . Heart disease Mother   . Heart failure Mother   . Diabetes Father   . Lung cancer Father   . Diabetes Sister   . Diabetes Brother   . Cancer Maternal Grandmother     breast     History   Social History  . Marital Status: Married    Spouse Name: N/A  . Number of Children: N/A  . Years of Education: N/A   Occupational History  . Not on file.   Social History Main Topics  . Smoking status: Former Smoker -- 0.25 packs/day for 20 years    Types: Cigarettes    Quit date: 05/09/1999  . Smokeless tobacco: Never Used  . Alcohol Use: No  . Drug Use: No  . Sexual Activity: Not on file   Other Topics Concern  . Not on file   Social History Narrative     ROS A 10 point review of system was performed. It is negative other than that mentioned in the history of present illness.   PHYSICAL EXAM   BP 106/78 mmHg  Pulse 80  Ht 5' 4.5" (1.638  m)  Wt 168 lb 12 oz (76.544 kg)  BMI 28.53 kg/m2 Constitutional: She is oriented to person, place, and time. She appears well-developed and well-nourished. No distress.  HENT: No nasal discharge.  Head: Normocephalic and atraumatic.  Eyes: Pupils are equal and round. No discharge.  Neck: Normal range of motion. Neck supple. No JVD present. No thyromegaly present.  Cardiovascular: Normal rate, regular rhythm, normal heart sounds. Exam reveals no gallop and no friction rub. No murmur heard.  Pulmonary/Chest: Effort normal and breath sounds normal. No stridor. No respiratory distress. She has no wheezes. She has no rales. She exhibits no tenderness.  Abdominal: Soft. Bowel sounds are normal. She exhibits no distension. There is no tenderness. There is no rebound  and no guarding.  Musculoskeletal: Normal range of motion. She exhibits no edema and no tenderness.  Neurological: She is alert and oriented to person, place, and time. Coordination normal.  Skin: Skin is warm and dry. No rash noted. She is not diaphoretic. No erythema. No pallor.  Psychiatric: She has a normal mood and affect. Her behavior is normal. Judgment and thought content normal.     XTA:VWPVX  Rhythm  Low voltage in precordial leads.   ABNORMAL     ASSESSMENT AND PLAN

## 2014-10-05 NOTE — Patient Instructions (Addendum)
[place]  Your caregiver has ordered a Stress Test with nuclear imaging. The purpose of this test is to evaluate the blood supply to your heart muscle. This procedure is referred to as a "Non-Invasive Stress Test." This is because other than having an IV started in your vein, nothing is inserted or "invades" your body. Cardiac stress tests are done to find areas of poor blood flow to the heart by determining the extent of coronary artery disease (CAD). Some patients exercise on a treadmill, which naturally increases the blood flow to your heart, while others who are  unable to walk on a treadmill due to physical limitations have a pharmacologic/chemical stress agent called Lexiscan . This medicine will mimic walking on a treadmill by temporarily increasing your coronary blood flow.   Please note: these test may take anywhere between 2-4 hours to complete  PLEASE REPORT TO [place] AT THE FIRST DESK WILL DIRECT YOU WHERE TO GO  Date of Procedure:_______2/22/16______________________________  Arrival Time for Procedure:_______0745_am______________________  Instructions regarding medication:   __x__ : Hold diabetes medication morning of procedure    PLEASE NOTIFY THE OFFICE AT LEAST 24 HOURS IN ADVANCE IF YOU ARE UNABLE TO KEEP YOUR APPOINTMENT.  [phone] AND  PLEASE NOTIFY NUCLEAR MEDICINE AT [facility] AT LEAST 24 HOURS IN ADVANCE IF YOU ARE UNABLE TO KEEP YOUR APPOINTMENT. [phone]  How to prepare for your Myoview test:  1. Do not eat or drink after midnight 2. No caffeine for 24 hours prior to test 3. No smoking 24 hours prior to test. 4. Your medication may be taken with water.  If your doctor stopped a medication because of this test, do not take that medication. 5. Ladies, please do not wear dresses.  Skirts or pants are appropriate. Please wear a short sleeve shirt. 6. No perfume, cologne or lotion. 7. Wear comfortable walking shoes. No  heels!       Your physician has recommended you make the following change in your medication:  Start Aspirin 81 mg once daily   Your physician recommends that you schedule a follow-up appointment in:  As needed

## 2014-10-05 NOTE — Assessment & Plan Note (Signed)
Continue treatment with atorvastatin. I discussed with her the importance of diet and exercise. Target LDL is less than 100 and ideally less than 70.

## 2014-10-05 NOTE — Assessment & Plan Note (Signed)
The patient has evidence of atherosclerosis on CT scan. Risk factors include type 2 diabetes, age, hypertension and hyperlipidemia. Symptoms include exertional dyspnea without chest pain. I recommend evaluation with a treadmill nuclear stress test to exclude obstructive coronary artery disease. I recommend aggressive treatment of risk factors. This was discussed with her today. I also started her on low-dose aspirin 81 mg once daily.

## 2014-10-09 ENCOUNTER — Ambulatory Visit: Payer: Self-pay | Admitting: Cardiovascular Disease

## 2014-10-09 DIAGNOSIS — R0602 Shortness of breath: Secondary | ICD-10-CM

## 2014-10-10 ENCOUNTER — Encounter: Payer: Self-pay | Admitting: Internal Medicine

## 2014-10-10 ENCOUNTER — Ambulatory Visit (INDEPENDENT_AMBULATORY_CARE_PROVIDER_SITE_OTHER): Payer: BC Managed Care – PPO | Admitting: Internal Medicine

## 2014-10-10 VITALS — BP 129/81 | HR 77 | Temp 97.9°F | Ht 64.5 in | Wt 168.4 lb

## 2014-10-10 DIAGNOSIS — R059 Cough, unspecified: Secondary | ICD-10-CM

## 2014-10-10 DIAGNOSIS — E785 Hyperlipidemia, unspecified: Secondary | ICD-10-CM

## 2014-10-10 DIAGNOSIS — R05 Cough: Secondary | ICD-10-CM

## 2014-10-10 DIAGNOSIS — E119 Type 2 diabetes mellitus without complications: Secondary | ICD-10-CM

## 2014-10-10 LAB — LIPID PANEL
CHOL/HDL RATIO: 4
Cholesterol: 177 mg/dL (ref 0–200)
HDL: 46.6 mg/dL (ref >39.00)
LDL Cholesterol: 104 mg/dL — ABNORMAL HIGH (ref 0–99)
NONHDL: 130.4
Triglycerides: 134 mg/dL (ref 0.0–149.0)
VLDL: 26.8 mg/dL (ref 0.0–40.0)

## 2014-10-10 LAB — COMPREHENSIVE METABOLIC PANEL
ALT: 23 U/L (ref 0–35)
AST: 22 U/L (ref 0–37)
Albumin: 4.5 g/dL (ref 3.5–5.2)
Alkaline Phosphatase: 57 U/L (ref 39–117)
BUN: 18 mg/dL (ref 6–23)
CO2: 29 mEq/L (ref 19–32)
Calcium: 9.8 mg/dL (ref 8.4–10.5)
Chloride: 105 mEq/L (ref 96–112)
Creatinine, Ser: 0.75 mg/dL (ref 0.40–1.20)
GFR: 82.47 mL/min (ref >60.00)
Glucose, Bld: 109 mg/dL — ABNORMAL HIGH (ref 70–99)
Potassium: 4.4 mEq/L (ref 3.5–5.1)
Sodium: 138 mEq/L (ref 135–145)
Total Bilirubin: 0.6 mg/dL (ref 0.2–1.2)
Total Protein: 7.3 g/dL (ref 6.0–8.3)

## 2014-10-10 LAB — HEMOGLOBIN A1C: Hgb A1c MFr Bld: 6.3 % (ref 4.6–6.5)

## 2014-10-10 LAB — MICROALBUMIN / CREATININE URINE RATIO
Creatinine,U: 165.4 mg/dL
Microalb Creat Ratio: 0.7 mg/g (ref 0.0–30.0)
Microalb, Ur: 1.1 mg/dL (ref 0.0–1.9)

## 2014-10-10 NOTE — Progress Notes (Signed)
Pre visit review using our clinic review tool, if applicable. No additional management support is needed unless otherwise documented below in the visit note. 

## 2014-10-10 NOTE — Patient Instructions (Signed)
Labs today

## 2014-10-10 NOTE — Progress Notes (Signed)
Subjective:    Patient ID: Amber Oliver, female    DOB: 07-07-1951, 64 y.o.   MRN: [id number]  HPI  64YO female presents for follow up.  Last seen 1/26 with persistent cough. Seen by pulmonary medicine. Pulmonary function testing ordered. CT chest reviewed, showing scarring in the right lower lobe. Seen by cardiology and stress test ordered and is pending.  Continues to have some cough, however not as frequent. Not using inhaler or cough medication. No dyspnea, chest pain, fever.  DM - Compliant with metformin. BG have been up and down. Nothing near 200. All less than 150.    Past medical, surgical, family and social history per today's encounter.  Review of Systems  Constitutional: Negative for fever, chills, appetite change, fatigue and unexpected weight change.  Eyes: Negative for visual disturbance.  Respiratory: Positive for cough. Negative for chest tightness and shortness of breath.   Cardiovascular: Negative for chest pain and leg swelling.  Gastrointestinal: Negative for vomiting, abdominal pain, diarrhea and constipation.  Skin: Negative for color change and rash.  Hematological: Negative for adenopathy. Does not bruise/bleed easily.  Psychiatric/Behavioral: Negative for dysphoric mood. The patient is not nervous/anxious.        Objective:    BP 129/81 mmHg  Pulse 77  Temp(Src) 97.9 F (36.6 C) (Oral)  Ht 5' 4.5" (1.638 m)  Wt 168 lb 6 oz (76.374 kg)  BMI 28.47 kg/m2  SpO2 98% Physical Exam  Constitutional: She is oriented to person, place, and time. She appears well-developed and well-nourished. No distress.  HENT:  Head: Normocephalic and atraumatic.  Right Ear: External ear normal.  Left Ear: External ear normal.  Nose: Nose normal.  Mouth/Throat: Oropharynx is clear and moist. No oropharyngeal exudate.  Eyes: Conjunctivae are normal. Pupils are equal, round, and reactive to light. Right eye exhibits no discharge. Left eye exhibits no discharge. No  scleral icterus.  Neck: Normal range of motion. Neck supple. No tracheal deviation present. No thyromegaly present.  Cardiovascular: Normal rate, regular rhythm, normal heart sounds and intact distal pulses.  Exam reveals no gallop and no friction rub.   No murmur heard. Pulmonary/Chest: Effort normal and breath sounds normal. No respiratory distress. She has no wheezes. She has no rales. She exhibits no tenderness.  Musculoskeletal: Normal range of motion. She exhibits no edema or tenderness.  Lymphadenopathy:    She has no cervical adenopathy.  Neurological: She is alert and oriented to person, place, and time. No cranial nerve deficit. She exhibits normal muscle tone. Coordination normal.  Skin: Skin is warm and dry. No rash noted. She is not diaphoretic. No erythema. No pallor.  Psychiatric: She has a normal mood and affect. Her behavior is normal. Judgment and thought content normal.          Assessment & Plan:   Problem List Items Addressed This Visit      Unprioritized   Cough - Primary    Cough has improved somewhat. S/p evaluation by pulmonary and cardiology. PFTs pending. Stress test completed and results pending. CT normal except for possible scarring RLL. Will continue to monitor.      Diabetes type 2, controlled    Will check A1c with labs. Continue Metformin.      Relevant Orders   Comprehensive metabolic panel   Hemoglobin A1c   Lipid panel   Microalbumin / creatinine urine ratio   Hyperlipidemia LDL goal <70    Will check lipids and LFTs with labs. Continue Atorvastatin.  Other Visit Diagnoses    Diabetes mellitus            Return in about 6 months (around 04/10/2015) for Wellness Visit.

## 2014-10-10 NOTE — Assessment & Plan Note (Signed)
Will check lipids and LFTs with labs. Continue Atorvastatin. 

## 2014-10-10 NOTE — Assessment & Plan Note (Signed)
Cough has improved somewhat. S/p evaluation by pulmonary and cardiology. PFTs pending. Stress test completed and results pending. CT normal except for possible scarring RLL. Will continue to monitor.

## 2014-10-10 NOTE — Assessment & Plan Note (Signed)
Will check A1c with labs. Continue Metformin. 

## 2014-10-16 ENCOUNTER — Ambulatory Visit: Payer: BC Managed Care – PPO

## 2014-10-19 ENCOUNTER — Other Ambulatory Visit: Payer: Self-pay | Admitting: Internal Medicine

## 2014-10-19 DIAGNOSIS — R05 Cough: Secondary | ICD-10-CM

## 2014-10-19 DIAGNOSIS — R059 Cough, unspecified: Secondary | ICD-10-CM

## 2014-10-24 ENCOUNTER — Encounter: Payer: Self-pay | Admitting: Internal Medicine

## 2014-10-24 ENCOUNTER — Ambulatory Visit (INDEPENDENT_AMBULATORY_CARE_PROVIDER_SITE_OTHER): Payer: BC Managed Care – PPO | Admitting: Internal Medicine

## 2014-10-24 VITALS — BP 120/82 | HR 91 | Ht 64.5 in | Wt 170.0 lb

## 2014-10-24 DIAGNOSIS — R05 Cough: Secondary | ICD-10-CM

## 2014-10-24 DIAGNOSIS — R059 Cough, unspecified: Secondary | ICD-10-CM

## 2014-10-24 DIAGNOSIS — R0600 Dyspnea, unspecified: Secondary | ICD-10-CM

## 2014-10-24 LAB — PULMONARY FUNCTION TEST
DL/VA % PRED: 83 %
DL/VA: 4.06 ml/min/mmHg/L
DLCO unc % pred: 114 %
DLCO unc: 28.49 ml/min/mmHg
FEF 25-75 Post: 1.85 L/sec
FEF 25-75 Pre: 1.47 L/sec
FEF2575-%Change-Post: 25 %
FEF2575-%PRED-POST: 85 %
FEF2575-%Pred-Pre: 67 %
FEV1-%CHANGE-POST: 7 %
FEV1-%PRED-POST: 88 %
FEV1-%PRED-PRE: 82 %
FEV1-POST: 2.18 L
FEV1-Pre: 2.04 L
FEV1FVC-%Change-Post: 5 %
FEV1FVC-%PRED-PRE: 93 %
FEV6-%Change-Post: 1 %
FEV6-%PRED-PRE: 91 %
FEV6-%Pred-Post: 92 %
FEV6-PRE: 2.84 L
FEV6-Post: 2.87 L
FEV6FVC-%CHANGE-POST: 0 %
FEV6FVC-%Pred-Post: 103 %
FEV6FVC-%Pred-Pre: 104 %
FVC-%Change-Post: 1 %
FVC-%Pred-Post: 89 %
FVC-%Pred-Pre: 87 %
FVC-PRE: 2.84 L
FVC-Post: 2.88 L
POST FEV1/FVC RATIO: 76 %
Post FEV6/FVC ratio: 100 %
Pre FEV1/FVC ratio: 72 %
Pre FEV6/FVC Ratio: 100 %

## 2014-10-24 NOTE — Assessment & Plan Note (Signed)
Cough - intermittent over the past 3 months. Last respiratory infection in mid December putting her about 8 weeks out Lisinopril was stopped for a short period of time with no relief - days No major relief with albuterol  Differential Diagnosis - post infectious,allergies, pharyngeal/laryngeal issues, upper airway cough syndrome,   The standardized cough guidelines published in Chest by Lissa Morales in 2006 are still the best available and consist of a multiple step process (up to 12!) , not a single office visit,  and are intended  to address this problem logically,  with an alogrithm dependent on response to empiric treatment at  each progressive step  to determine a specific diagnosis with  minimal addtional testing needed. Therefore if adherence is an issue or can't be accurately verified,  it's very unlikely the standard evaluation and treatment will be successful here.    Furthermore, response to therapy (other than acute cough suppression, which should only be used short term with avoidance of narcotic containing cough syrups if possible), can be a gradual process for which the patient may perceive immediate benefit.  Plan  - pulmonary function testing with mild restriction, mild decrease in TLC which could be due to the obesity additionally her ERV is also severely decreased again could also be given to obesity. -Today we have decided to give another trial off of lisinopril, this time for one complete month. Communicate with her primary care physician to start another hypertensive in the interim. - I have reviewed the CT Chest with Dr. Rosario Jacks - no acute process, there is some mild atelectasis and scaring in the right lower lobe - this does do not correlate to her cough. - referral to ENT for further cough workup and evaluation of sinuses and nasal passages that could be leading to chronic cough. - No obstruction noted on PFT, if ENT evaluation is negative will consider asthma and possibly  give a trial of inhaled corticosteroid.

## 2014-10-24 NOTE — Progress Notes (Signed)
MRN# [id number] Amber Oliver June 19, 1951   CC:"follow up for my cough" Chief Complaint  Patient presents with  . Cough    Cough is unchanged since last OV.    Synopsis: 65 year old past medical history of hyperlipidemia diabetes presented to [facility] 09/2014 evaluation of chronic cough. Diagnosed bronchitis December 2015, chronic cough since then, usually has one cold per year treated with supportive care. PFTs with mild restriction TLC 67, RV 48, FEV1 82 MVC 87 FEV1/FVC 72, ERV 35, DLCO 114.    Brief History: 09/2014 - Cough, intermittent since December 2015, non productive.  Trial off lisinopril for 5 days, no improvement. Negative CT Chest -Plan: PFTs, 6MWT   Events since last clinic visit: Patient presents today for followup visit, she states that since her last visit she is still having intermittent episodes of cough, they are nonproductive. Further history elicited that there is no pattern to the cough, patient thinks it may be seasonal U. Maybe in the winter months. Coughing spell in short but intermittent coughing for last about 10 hour, continuous and its not everyday. Current review of medications show that she's currently on lisinopril, patient had a trial off lisinopril in the past which did not alleviate her cough. She does have a smoking history and today had her pulmonary function testing 6 minute walk test performed.  PMHX:   Past Medical History  Diagnosis Date  . Hyperlipidemia   . Diabetes mellitus   . Kidney stone   . Hypertension    Surgical Hx:  Past Surgical History  Procedure Laterality Date  . Trigger finger release     Family Hx:  Family History  Problem Relation Age of Onset  . Heart disease Mother   . Heart failure Mother   . Diabetes Father   . Lung cancer Father   . Diabetes Sister   . Diabetes Brother   . Cancer Maternal Grandmother     breast   Social Hx:   History  Substance Use Topics  . Smoking status: Former Smoker -- 0.25  packs/day for 20 years    Types: Cigarettes    Quit date: 05/09/1999  . Smokeless tobacco: Never Used  . Alcohol Use: No   Medication:   Current Outpatient Rx  Name  Route  Sig  Dispense  Refill  . ACCU-CHEK AVIVA PLUS test strip      USE ONE STRIP TO CHECK GLUCOSE TWICE DAILY   100 each   3   . albuterol (PROVENTIL HFA;VENTOLIN HFA) 108 (90 BASE) MCG/ACT inhaler   Inhalation   Inhale 2 puffs into the lungs every 6 (six) hours as needed for wheezing or shortness of breath.   1 Inhaler   0   . aspirin 81 MG tablet   Oral   Take 1 tablet (81 mg total) by mouth daily.   30 tablet      . atorvastatin (LIPITOR) 20 MG tablet      TAKE ONE TABLET BY MOUTH ONCE DAILY   90 tablet   1   . chlorpheniramine-HYDROcodone (TUSSIONEX) 10-8 MG/5ML LQCR   Oral   Take 5 mLs by mouth every 12 (twelve) hours as needed.   140 mL   0   . cholecalciferol (VITAMIN D) 1000 UNITS tablet   Oral   Take 1,000 Units by mouth 2 (two) times daily.           . fish oil-omega-3 fatty acids 1000 MG capsule   Oral   Take 2 g  by mouth daily. MEGA RED BRAND          . lisinopril (PRINIVIL,ZESTRIL) 5 MG tablet      TAKE ONE TABLET BY MOUTH ONCE DAILY   30 tablet   5   . metFORMIN (GLUCOPHAGE) 500 MG tablet      TAKE ONE TABLET BY MOUTH TWICE DAILY WITH MEALS   60 tablet   5   . POTASSIUM PO   Oral   Take 500 mg by mouth daily.            Review of Systems: Gen:  Denies  fever, sweats, chills HEENT: Denies blurred vision, double vision, ear pain, eye pain, hearing loss, nose bleeds, sore throat Cvc:  No dizziness, chest pain or heaviness Resp:   cough Gi: Denies swallowing difficulty, stomach pain, nausea or vomiting, diarrhea, constipation, bowel incontinence Gu:  Denies bladder incontinence, burning urine Ext:   No Joint pain, stiffness or swelling Skin: No skin rash, easy bruising or bleeding or hives Endoc:  No polyuria, polydipsia , polyphagia or weight change Psych: No  depression, insomnia or hallucinations  Other:  All other systems negative  Allergies:  Ciprofloxacin; Influenza vaccines; and Sulfa drugs cross reactors  Physical Examination:  VS: BP 120/82 mmHg  Pulse 91  Ht 5' 4.5" (1.638 m)  Wt 170 lb (77.111 kg)  BMI 28.74 kg/m2  SpO2 96%  General Appearance: No distress  Neuro: EXAM: without focal findings, mental status, speech normal, alert and oriented, cranial nerves 2-12 grossly normal  HEENT: PERRLA, EOM intact, no ptosis, no other lesions noticed Pulmonary:Exam: normal breath sounds., diaphragmatic excursion normal.No wheezing, No rales   Cardiovascular:@ Exam:  Normal S1,S2.  No m/r/g.     Abdomen:Exam: Benign, Soft, non-tender, No masses  Skin:   warm, no rashes, no ecchymosis  Extremities: normal, no cyanosis, clubbing, no edema, warm with normal capillary refill.   Labs results:  BMP Lab Results  Component Value Date   NA 138 10/10/2014   K 4.4 10/10/2014   CL 105 10/10/2014   CO2 29 10/10/2014   GLUCOSE 109* 10/10/2014   BUN 18 10/10/2014   CREATININE 0.75 10/10/2014     CBC CBC Latest Ref Rng 05/07/2011  WBC 4.5 - 10.5 K/uL 6.2  Hemoglobin 12.0 - 15.0 g/dL 14.4  Hematocrit 36.0 - 46.0 % 43.1  Platelets 150.0 - 400.0 K/uL 197.0     Rad results: not available     Assessment and Plan:64 year old chronic cough evaluation Cough Cough - intermittent over the past 3 months. Last respiratory infection in mid December putting her about 8 weeks out Lisinopril was stopped for a short period of time with no relief - days No major relief with albuterol  Differential Diagnosis - post infectious,allergies, pharyngeal/laryngeal issues, upper airway cough syndrome,   The standardized cough guidelines published in Chest by Lissa Morales in 2006 are still the best available and consist of a multiple step process (up to 12!) , not a single office visit,  and are intended  to address this problem logically,  with an alogrithm  dependent on response to empiric treatment at  each progressive step  to determine a specific diagnosis with  minimal addtional testing needed. Therefore if adherence is an issue or can't be accurately verified,  it's very unlikely the standard evaluation and treatment will be successful here.    Furthermore, response to therapy (other than acute cough suppression, which should only be used short term with avoidance of narcotic containing  cough syrups if possible), can be a gradual process for which the patient may perceive immediate benefit.  Plan  - pulmonary function testing with mild restriction, mild decrease in TLC which could be due to the obesity additionally her ERV is also severely decreased again could also be given to obesity. -Today we have decided to give another trial off of lisinopril, this time for one complete month. Communicate with her primary care physician to start another hypertensive in the interim. - I have reviewed the CT Chest with Dr. Rosario Jacks - no acute process, there is some mild atelectasis and scaring in the right lower lobe - this does do not correlate to her cough. - referral to ENT for further cough workup and evaluation of sinuses and nasal passages that could be leading to chronic cough. - No obstruction noted on PFT, if ENT evaluation is negative will consider asthma and possibly give a trial of inhaled corticosteroid.       Updated Medication List Outpatient Encounter Prescriptions as of 10/24/2014  Medication Sig  . ACCU-CHEK AVIVA PLUS test strip USE ONE STRIP TO CHECK GLUCOSE TWICE DAILY  . albuterol (PROVENTIL HFA;VENTOLIN HFA) 108 (90 BASE) MCG/ACT inhaler Inhale 2 puffs into the lungs every 6 (six) hours as needed for wheezing or shortness of breath.  Marland Kitchen aspirin 81 MG tablet Take 1 tablet (81 mg total) by mouth daily.  Marland Kitchen atorvastatin (LIPITOR) 20 MG tablet TAKE ONE TABLET BY MOUTH ONCE DAILY  . chlorpheniramine-HYDROcodone (TUSSIONEX) 10-8 MG/5ML LQCR  Take 5 mLs by mouth every 12 (twelve) hours as needed.  . cholecalciferol (VITAMIN D) 1000 UNITS tablet Take 1,000 Units by mouth 2 (two) times daily.    . fish oil-omega-3 fatty acids 1000 MG capsule Take 2 g by mouth daily. MEGA RED BRAND   . lisinopril (PRINIVIL,ZESTRIL) 5 MG tablet TAKE ONE TABLET BY MOUTH ONCE DAILY  . metFORMIN (GLUCOPHAGE) 500 MG tablet TAKE ONE TABLET BY MOUTH TWICE DAILY WITH MEALS  . POTASSIUM PO Take 500 mg by mouth daily.    Orders for this visit: Orders Placed This Encounter  Procedures  . Ambulatory referral to Allergy    Referral Priority:  Routine    Referral Type:  Allergy Testing    Referral Reason:  Specialty Services Required    Requested Specialty:  Allergy    Number of Visits Requested:  1    Thank  you for the visitation and for allowing  [facility] to assist in the care of your patient. Our recommendations are noted above.  Please contact us if we can be of further service.  Vilinda Boehringer, MD [facility] Office Number: [phone]

## 2014-10-24 NOTE — Patient Instructions (Signed)
Follow up with Dr. Stevenson Clinch in 1 month - we make a referral to see ENT (Dr. Tami Ribas office) for evaluation of chronic cough - you have a normal Ct Scan of you chest - you have a chest xray

## 2014-10-24 NOTE — Progress Notes (Signed)
PFT performed today. 

## 2014-10-24 NOTE — Progress Notes (Signed)
Six Minute Walk performed today.

## 2014-10-25 ENCOUNTER — Other Ambulatory Visit: Payer: Self-pay | Admitting: *Deleted

## 2014-10-25 MED ORDER — LOSARTAN POTASSIUM 25 MG PO TABS: 25.0000 mg | ORAL_TABLET | Freq: Every day | ORAL | Status: DC

## 2014-11-02 ENCOUNTER — Other Ambulatory Visit (INDEPENDENT_AMBULATORY_CARE_PROVIDER_SITE_OTHER): Payer: BC Managed Care – PPO

## 2014-11-02 DIAGNOSIS — E119 Type 2 diabetes mellitus without complications: Secondary | ICD-10-CM

## 2014-11-02 DIAGNOSIS — R7989 Other specified abnormal findings of blood chemistry: Secondary | ICD-10-CM

## 2014-11-02 LAB — LIPID PANEL
CHOL/HDL RATIO: 4
Cholesterol: 153 mg/dL (ref 0–200)
HDL: 40.5 mg/dL (ref >39.00)
NONHDL: 112.5
Triglycerides: 227 mg/dL — ABNORMAL HIGH (ref 0.0–149.0)
VLDL: 45.4 mg/dL — ABNORMAL HIGH (ref 0.0–40.0)

## 2014-11-02 LAB — COMPREHENSIVE METABOLIC PANEL
ALT: 20 U/L (ref 0–35)
AST: 16 U/L (ref 0–37)
Albumin: 4.4 g/dL (ref 3.5–5.2)
Alkaline Phosphatase: 57 U/L (ref 39–117)
BILIRUBIN TOTAL: 0.5 mg/dL (ref 0.2–1.2)
BUN: 13 mg/dL (ref 6–23)
CHLORIDE: 106 meq/L (ref 96–112)
CO2: 29 meq/L (ref 19–32)
Calcium: 9.5 mg/dL (ref 8.4–10.5)
Creatinine, Ser: 0.75 mg/dL (ref 0.40–1.20)
GFR: 82.45 mL/min (ref >60.00)
Glucose, Bld: 98 mg/dL (ref 70–99)
POTASSIUM: 4.2 meq/L (ref 3.5–5.1)
SODIUM: 139 meq/L (ref 135–145)
Total Protein: 6.7 g/dL (ref 6.0–8.3)

## 2014-11-02 LAB — LDL CHOLESTEROL, DIRECT: LDL DIRECT: 89 mg/dL

## 2014-11-02 LAB — HEMOGLOBIN A1C: Hgb A1c MFr Bld: 6.4 % (ref 4.6–6.5)

## 2014-11-08 ENCOUNTER — Encounter: Payer: Self-pay | Admitting: Internal Medicine

## 2014-11-09 ENCOUNTER — Encounter: Payer: Self-pay | Admitting: Internal Medicine

## 2014-11-09 ENCOUNTER — Telehealth: Payer: Self-pay | Admitting: Internal Medicine

## 2014-11-09 ENCOUNTER — Ambulatory Visit (INDEPENDENT_AMBULATORY_CARE_PROVIDER_SITE_OTHER): Payer: BC Managed Care – PPO | Admitting: Internal Medicine

## 2014-11-09 VITALS — BP 139/78 | HR 71 | Temp 97.6°F | Ht 64.5 in | Wt 174.1 lb

## 2014-11-09 DIAGNOSIS — I10 Essential (primary) hypertension: Secondary | ICD-10-CM

## 2014-11-09 DIAGNOSIS — E119 Type 2 diabetes mellitus without complications: Secondary | ICD-10-CM

## 2014-11-09 DIAGNOSIS — R059 Cough, unspecified: Secondary | ICD-10-CM

## 2014-11-09 DIAGNOSIS — R05 Cough: Secondary | ICD-10-CM | POA: Diagnosis not present

## 2014-11-09 MED ORDER — AMLODIPINE BESYLATE 5 MG PO TABS: 5.0000 mg | ORAL_TABLET | Freq: Every day | ORAL | Status: DC

## 2014-11-09 NOTE — Assessment & Plan Note (Signed)
Reviewed recent A1c which showed excellent control of BG. Continue Metformin. Will plan to recheck A1c in 04/2015.

## 2014-11-09 NOTE — Assessment & Plan Note (Signed)
Persistent dry cough. Workup to date has been normal. ENT recommended stopping Losartan. Discussed risk/benefits of stopping Losartan. Will temporarily stop Losartan and will start Amlodipine. Follow up BP check in 4 weeks.

## 2014-11-09 NOTE — Progress Notes (Signed)
Pre visit review using our clinic review tool, if applicable. No additional management support is needed unless otherwise documented below in the visit note. 

## 2014-11-09 NOTE — Telephone Encounter (Signed)
emmi emailed °

## 2014-11-09 NOTE — Assessment & Plan Note (Signed)
As noted, will stop Losartan and start Amlodipine to see if any improvement in cough. Recheck BP in 4 weeks.

## 2014-11-09 NOTE — Progress Notes (Signed)
Subjective:    Patient ID: Amber Oliver, female    DOB: August 19, 1950, 64 y.o.   MRN: [id number]  HPI  64YO female presents for follow up.  Continues to have dry, irritating cough over last year. ENT would like to stop Losartan. Plans to start Zantac to help with GERD to see if that is contributing. No dyspnea. No fever.   DM - BG have been well controlled. Last a1c 6.4% Compliant with medication.  Wt Readings from Last 3 Encounters:  11/09/14 174 lb 2 oz (78.983 kg)  10/24/14 170 lb (77.111 kg)  10/10/14 168 lb 6 oz (76.374 kg)   BP Readings from Last 3 Encounters:  11/09/14 139/78  10/24/14 120/82  10/10/14 129/81     Past medical, surgical, family and social history per today's encounter.  Review of Systems  Constitutional: Negative for fever, chills, appetite change, fatigue and unexpected weight change.  Eyes: Negative for visual disturbance.  Respiratory: Positive for cough. Negative for shortness of breath.   Cardiovascular: Negative for chest pain and leg swelling.  Gastrointestinal: Negative for nausea, vomiting, abdominal pain, diarrhea and constipation.  Musculoskeletal: Negative for myalgias and arthralgias.  Skin: Negative for color change and rash.  Hematological: Negative for adenopathy. Does not bruise/bleed easily.  Psychiatric/Behavioral: Negative for sleep disturbance and dysphoric mood. The patient is not nervous/anxious.        Objective:    BP 139/78 mmHg  Pulse 71  Temp(Src) 97.6 F (36.4 C) (Oral)  Ht 5' 4.5" (1.638 m)  Wt 174 lb 2 oz (78.983 kg)  BMI 29.44 kg/m2  SpO2 99% Physical Exam  Constitutional: She is oriented to person, place, and time. She appears well-developed and well-nourished. No distress.  HENT:  Head: Normocephalic and atraumatic.  Right Ear: External ear normal.  Left Ear: External ear normal.  Nose: Nose normal.  Mouth/Throat: Oropharynx is clear and moist. No oropharyngeal exudate.  Eyes: Conjunctivae are  normal. Pupils are equal, round, and reactive to light. Right eye exhibits no discharge. Left eye exhibits no discharge. No scleral icterus.  Neck: Normal range of motion. Neck supple. No tracheal deviation present. No thyromegaly present.  Cardiovascular: Normal rate, regular rhythm, normal heart sounds and intact distal pulses.  Exam reveals no gallop and no friction rub.   No murmur heard. Pulmonary/Chest: Effort normal and breath sounds normal. No respiratory distress. She has no wheezes. She has no rales. She exhibits no tenderness.  Musculoskeletal: Normal range of motion. She exhibits no edema or tenderness.  Lymphadenopathy:    She has no cervical adenopathy.  Neurological: She is alert and oriented to person, place, and time. No cranial nerve deficit. She exhibits normal muscle tone. Coordination normal.  Skin: Skin is warm and dry. No rash noted. She is not diaphoretic. No erythema. No pallor.  Psychiatric: She has a normal mood and affect. Her behavior is normal. Judgment and thought content normal.          Assessment & Plan:   Problem List Items Addressed This Visit      Unprioritized   Cough - Primary    Persistent dry cough. Workup to date has been normal. ENT recommended stopping Losartan. Discussed risk/benefits of stopping Losartan. Will temporarily stop Losartan and will start Amlodipine. Follow up BP check in 4 weeks.      Diabetes type 2, controlled    Reviewed recent A1c which showed excellent control of BG. Continue Metformin. Will plan to recheck A1c in 04/2015.  Hypertension    As noted, will stop Losartan and start Amlodipine to see if any improvement in cough. Recheck BP in 4 weeks.      Relevant Medications   amLODIpine (NORVASC) tablet       Return in about 4 weeks (around 12/07/2014) for Recheck of Blood Pressure.

## 2014-11-09 NOTE — Patient Instructions (Signed)
Stop Losartan.  Start Amlodipine 5mg  daily.  Follow up blood pressure check in 4 weeks.

## 2014-11-29 ENCOUNTER — Encounter: Payer: Self-pay | Admitting: Internal Medicine

## 2014-11-29 ENCOUNTER — Ambulatory Visit (INDEPENDENT_AMBULATORY_CARE_PROVIDER_SITE_OTHER): Payer: Medicare PPO | Admitting: Internal Medicine

## 2014-11-29 VITALS — BP 130/70 | HR 98 | Temp 97.6°F | Ht 64.5 in | Wt 176.0 lb

## 2014-11-29 DIAGNOSIS — R05 Cough: Secondary | ICD-10-CM

## 2014-11-29 DIAGNOSIS — R059 Cough, unspecified: Secondary | ICD-10-CM

## 2014-11-29 MED ORDER — BECLOMETHASONE DIPROPIONATE 80 MCG/ACT IN AERS: 1.0000 | INHALATION_SPRAY | Freq: Every day | RESPIRATORY_TRACT | Status: DC

## 2014-11-29 NOTE — Assessment & Plan Note (Signed)
Differential Diagnosis - post infectious,allergies, pharyngeal/laryngeal issues, upper airway cough syndrome, allergies, possible irritant induce asthma  The standardized cough guidelines published in Chest by Lissa Morales in 2006 are still the best available and consist of a multiple step process (up to 12!) , not a single office visit,  and are intended  to address this problem logically,  with an alogrithm dependent on response to empiric treatment at  each progressive step  to determine a specific diagnosis with  minimal addtional testing needed. Therefore if adherence is an issue or can't be accurately verified,  it's very unlikely the standard evaluation and treatment will be successful here.    Furthermore, response to therapy (other than acute cough suppression, which should only be used short term with avoidance of narcotic containing cough syrups if possible), can be a gradual process for which the patient may perceive immediate benefit.  Plan  - pulmonary function testing with mild restriction, mild decrease in TLC which could be due to the obesity additionally her ERV is also severely decreased again could also be given to obesity. - losartan has been stopped by PMD, now on Norvasc, ENT is giving trial of omeprazole, if no improvement ENT will discuss allergy testing  - I have reviewed the CT Chest with Dr. Rosario Jacks - no acute process, there is some mild atelectasis and scaring in the right lower lobe - this does do not correlate to her cough. - No obstruction noted on PFT - will give a 1 month trial of Qvar to assess clinical response, possible irritant induce asthma.

## 2014-11-29 NOTE — Patient Instructions (Signed)
Follow up with Dr. Stevenson Clinch in 2 months - 1 month trial of Qvar 68mcg - 1 puff daily in the morning, rinse and gargle after each use.  - if having improvement with Qvar then call our office 1 week prior to ending of your Qvar trial for refills.  - use your albuterol RESCUE inhaler as directed - keep ENT follow up appointment for possible allergy testing.

## 2014-11-29 NOTE — Progress Notes (Signed)
MRN# [id number] Amber Oliver 1951-02-14   CC: Chief Complaint  Patient presents with  . Follow-up    Pt here f/u cough; She saw Dr. Tami Ribas 11/08/14. She still has coughing spells. She is wondering if it is allergy related.      Brief History: Synopsis: 64 year old past medical history of hyperlipidemia diabetes presented to [facility] 09/2014 evaluation of chronic cough. Diagnosed bronchitis December 2015, chronic cough since then, usually has one cold per year treated with supportive care. PFTs with mild restriction TLC 67, RV 48, FEV1 82 MVC 87 FEV1/FVC 72, ERV 35, DLCO 114.   Brief History: 09/2014 - Cough, intermittent since December 2015, non productive. Trial off lisinopril for 5 days, no improvement. Negative CT Chest -Plan: PFTs, 6MWT   ROV 10/24/14 Patient presents today for followup visit, she states that since her last visit she is still having intermittent episodes of cough, they are nonproductive. Further history elicited that there is no pattern to the cough, patient thinks it may be seasonal U. Maybe in the winter months. Coughing spell in short but intermittent coughing for last about 10 hour, continuous and its not everyday. Current review of medications show that she's currently on lisinopril, patient had a trial off lisinopril in the past which did not alleviate her cough. She does have a smoking history and today had her pulmonary function testing 6 minute walk test performed. Plan - stop losartan, ENT eval, if negative ENT eval then trial of ICS for possible asthma     Events since last clinic visit: Patient presents today for a follow up visit of chronic cough. Essentially has normal pfts. Had a recent coughing "spell" today, once, lasted about 1 minute, after being exposed to someone's perfume at the [place] with her Husband.  Today she stated that since her last visit her losartan has been changed to Norvasc, she had seen ENT and placed on a one month  trial of omeprazole. Overall patient states that her cough has improved slightly, now maybe once per week, and 1-2 spells when it does occur.  Non productive cough.    PMHX:   Past Medical History  Diagnosis Date  . Hyperlipidemia   . Diabetes mellitus   . Kidney stone   . Hypertension    Surgical Hx:  Past Surgical History  Procedure Laterality Date  . Trigger finger release     Family Hx:  Family History  Problem Relation Age of Onset  . Heart disease Mother   . Heart failure Mother   . Diabetes Father   . Lung cancer Father   . Diabetes Sister   . Diabetes Brother   . Cancer Maternal Grandmother     breast   Social Hx:   History  Substance Use Topics  . Smoking status: Former Smoker -- 0.25 packs/day for 20 years    Types: Cigarettes    Quit date: 05/09/1999  . Smokeless tobacco: Never Used  . Alcohol Use: No   Medication:   Current Outpatient Rx  Name  Route  Sig  Dispense  Refill  . ACCU-CHEK AVIVA PLUS test strip      USE ONE STRIP TO CHECK GLUCOSE TWICE DAILY   100 each   3   . albuterol (PROVENTIL HFA;VENTOLIN HFA) 108 (90 BASE) MCG/ACT inhaler   Inhalation   Inhale 2 puffs into the lungs every 6 (six) hours as needed for wheezing or shortness of breath.   1 Inhaler   0   . amLODipine (  NORVASC) 5 MG tablet   Oral   Take 1 tablet (5 mg total) by mouth daily.   90 tablet   3   . aspirin 81 MG tablet   Oral   Take 1 tablet (81 mg total) by mouth daily.   30 tablet      . atorvastatin (LIPITOR) 20 MG tablet      TAKE ONE TABLET BY MOUTH ONCE DAILY   90 tablet   1   . chlorpheniramine-HYDROcodone (TUSSIONEX) 10-8 MG/5ML LQCR   Oral   Take 5 mLs by mouth every 12 (twelve) hours as needed.   140 mL   0   . cholecalciferol (VITAMIN D) 1000 UNITS tablet   Oral   Take 1,000 Units by mouth 2 (two) times daily.           . fish oil-omega-3 fatty acids 1000 MG capsule   Oral   Take 2 g by mouth daily. MEGA RED BRAND          .  metFORMIN (GLUCOPHAGE) 500 MG tablet      TAKE ONE TABLET BY MOUTH TWICE DAILY WITH MEALS   60 tablet   5   . omeprazole (PRILOSEC) 40 MG capsule   Oral   Take 40 mg by mouth daily.         Marland Kitchen POTASSIUM PO   Oral   Take 500 mg by mouth daily.            Review of Systems: Gen:  Denies  fever, sweats, chills HEENT: Denies blurred vision, double vision, ear pain, eye pain, hearing loss, nose bleeds, sore throat Cvc:  No dizziness, chest pain or heaviness Resp:  Chronic cough Gi: Denies swallowing difficulty, stomach pain, nausea or vomiting, diarrhea, constipation, bowel incontinence Gu:  Denies bladder incontinence, burning urine Ext:   No Joint pain, stiffness or swelling Skin: No skin rash, easy bruising or bleeding or hives Endoc:  No polyuria, polydipsia , polyphagia or weight change Psych: No depression, insomnia or hallucinations  Other:  All other systems negative  Allergies:  Ciprofloxacin; Influenza vaccines; and Sulfa drugs cross reactors  Physical Examination:  VS: BP 130/70 mmHg  Pulse 98  Temp(Src) 97.6 F (36.4 C) (Oral)  Ht 5' 4.5" (1.638 m)  Wt 176 lb (79.833 kg)  BMI 29.75 kg/m2  SpO2 96%  General Appearance: No distress  Neuro: EXAM: without focal findings, mental status, speech normal, alert and oriented, cranial nerves 2-12 grossly normal  HEENT: PERRLA, EOM intact, no ptosis, no other lesions noticed Pulmonary:Exam: normal breath sounds., diaphragmatic excursion normal.No wheezing, No rales   Cardiovascular:@ Exam:  Normal S1,S2.  No m/r/g.     Abdomen:Exam: Benign, Soft, non-tender, No masses  Skin:   warm, no rashes, no ecchymosis  Extremities: normal, no cyanosis, clubbing, no edema, warm with normal capillary refill.   Labs results:  BMP Lab Results  Component Value Date   NA 139 11/02/2014   K 4.2 11/02/2014   CL 106 11/02/2014   CO2 29 11/02/2014   GLUCOSE 98 11/02/2014   BUN 13 11/02/2014   CREATININE 0.75 11/02/2014      CBC CBC Latest Ref Rng 05/07/2011  WBC 4.5 - 10.5 K/uL 6.2  Hemoglobin 12.0 - 15.0 g/dL 14.4  Hematocrit 36.0 - 46.0 % 43.1  Platelets 150.0 - 400.0 K/uL 197.0    Assessment and Plan: 64 yo female with Chronic cough since summer 2015, seen today for follow up visit after ENT eval.  Cough Differential Diagnosis - post infectious,allergies, pharyngeal/laryngeal issues, upper airway cough syndrome, allergies, possible irritant induce asthma  The standardized cough guidelines published in Chest by Lissa Morales in 2006 are still the best available and consist of a multiple step process (up to 12!) , not a single office visit,  and are intended  to address this problem logically,  with an alogrithm dependent on response to empiric treatment at  each progressive step  to determine a specific diagnosis with  minimal addtional testing needed. Therefore if adherence is an issue or can't be accurately verified,  it's very unlikely the standard evaluation and treatment will be successful here.    Furthermore, response to therapy (other than acute cough suppression, which should only be used short term with avoidance of narcotic containing cough syrups if possible), can be a gradual process for which the patient may perceive immediate benefit.  Plan  - pulmonary function testing with mild restriction, mild decrease in TLC which could be due to the obesity additionally her ERV is also severely decreased again could also be given to obesity. - losartan has been stopped by PMD, now on Norvasc, ENT is giving trial of omeprazole, if no improvement ENT will discuss allergy testing  - I have reviewed the CT Chest with Dr. Rosario Jacks - no acute process, there is some mild atelectasis and scaring in the right lower lobe - this does do not correlate to her cough. - No obstruction noted on PFT - will give a 1 month trial of Qvar to assess clinical response, possible irritant induce asthma.         Updated  Medication List Outpatient Encounter Prescriptions as of 11/29/2014  Medication Sig  . ACCU-CHEK AVIVA PLUS test strip USE ONE STRIP TO CHECK GLUCOSE TWICE DAILY  . albuterol (PROVENTIL HFA;VENTOLIN HFA) 108 (90 BASE) MCG/ACT inhaler Inhale 2 puffs into the lungs every 6 (six) hours as needed for wheezing or shortness of breath.  Marland Kitchen amLODipine (NORVASC) 5 MG tablet Take 1 tablet (5 mg total) by mouth daily.  Marland Kitchen aspirin 81 MG tablet Take 1 tablet (81 mg total) by mouth daily.  Marland Kitchen atorvastatin (LIPITOR) 20 MG tablet TAKE ONE TABLET BY MOUTH ONCE DAILY  . chlorpheniramine-HYDROcodone (TUSSIONEX) 10-8 MG/5ML LQCR Take 5 mLs by mouth every 12 (twelve) hours as needed.  . cholecalciferol (VITAMIN D) 1000 UNITS tablet Take 1,000 Units by mouth 2 (two) times daily.    . fish oil-omega-3 fatty acids 1000 MG capsule Take 2 g by mouth daily. MEGA RED BRAND   . metFORMIN (GLUCOPHAGE) 500 MG tablet TAKE ONE TABLET BY MOUTH TWICE DAILY WITH MEALS  . omeprazole (PRILOSEC) 40 MG capsule Take 40 mg by mouth daily.  Marland Kitchen POTASSIUM PO Take 500 mg by mouth daily.    Orders for this visit: No orders of the defined types were placed in this encounter.    Thank  you for the visitation and for allowing  [facility] to assist in the care of your patient. Our recommendations are noted above.  Please contact us if we can be of further service.  Vilinda Boehringer, MD [facility] Office Number: [phone]

## 2014-11-30 ENCOUNTER — Telehealth: Payer: Self-pay

## 2014-11-30 NOTE — Telephone Encounter (Signed)
Pt wants to know if Qvar will be long-term medication. She went to pharmacy and had rx filled and it was $80. She did purchase but was told she would have trouble getting through her ins the way rx is written 1 puff daily. She was told that if she really didn't need it that it would cause her to die. I told her that the doctor wouldn't prescribe something to harm her.

## 2014-11-30 NOTE — Telephone Encounter (Signed)
This is a trial for one month. If she is receiving benefit, will then change the dosing to BID.

## 2014-11-30 NOTE — Telephone Encounter (Signed)
Pt states she was given a rx for Qvar, and has had a problem with the insurance company, and would like to speak with someone about it. Please call.

## 2014-12-01 NOTE — Telephone Encounter (Signed)
Called patient back and let her know, she was correct that Qvar is a one month trial. If she does receive benefit, then Dr. Stevenson Clinch will change dosing to bid for insurance coverage. Nothing further needed.

## 2014-12-05 ENCOUNTER — Encounter: Payer: Self-pay | Admitting: Internal Medicine

## 2014-12-22 ENCOUNTER — Encounter: Payer: Self-pay | Admitting: Internal Medicine

## 2014-12-25 ENCOUNTER — Telehealth: Payer: Self-pay | Admitting: Internal Medicine

## 2014-12-25 NOTE — Telephone Encounter (Signed)
Spoke with pt and advised of Qvar recommendations per Dr Joya Gaskins.  Pt verbalized understanding.

## 2014-12-25 NOTE — Telephone Encounter (Signed)
Just stay on qvar one puff daily

## 2014-12-25 NOTE — Telephone Encounter (Signed)
Qvar is doing okay, she is only able to take 1 puff daily due to cost.  Patient can only get 30 day or 90 days due to insurance.  30 day supply costs $40, 90 days supply costs $80.  Patient wants to know if she should be taking 2 puffs a day.  She says that her cough is sporadic, so she would like to continue 1 puff instead of 2.    PW - please advise. (in Mungal's absence)

## 2015-01-30 ENCOUNTER — Encounter: Payer: Self-pay | Admitting: Internal Medicine

## 2015-01-30 ENCOUNTER — Ambulatory Visit (INDEPENDENT_AMBULATORY_CARE_PROVIDER_SITE_OTHER): Payer: Medicare PPO | Admitting: Internal Medicine

## 2015-01-30 VITALS — BP 118/80 | HR 76 | Temp 97.9°F | Ht 64.5 in | Wt 175.8 lb

## 2015-01-30 DIAGNOSIS — R05 Cough: Secondary | ICD-10-CM | POA: Diagnosis not present

## 2015-01-30 DIAGNOSIS — R059 Cough, unspecified: Secondary | ICD-10-CM

## 2015-01-30 NOTE — Assessment & Plan Note (Signed)
Showed much medical improvement since starting on her Qvar trial. Most of her symptoms started after her severe bronchitis episode in December 2015. Henrene Pastor function testing and follow-up imaging have not revealed any organic cause, structural cause for her symptoms. I suspect after a bronchitis episode she may have developed bronchial hyperactivity and asthma-like symptoms which are manifesting as chronic cough. This has been clinically reduced with the use of an inhaled steroids such as Qvar. Her symptoms are mild now and we have decided to use Qvar 1 puff daily. If the patient has increase in symptoms she is advised to increase her Qvar to 1 puff in the morning and 1 puff in the evening.  Plan Furthermore, response to therapy (other than acute cough suppression, which should only be used short term with avoidance of narcotic containing cough syrups if possible), can be a gradual process for which the patient may perceive immediate benefit.  Plan  -Continue Qvar 1 puff daily for chronic cough, may increase to 1 puff twice a day if symptoms increase. -Avoid any forms of smoke (first and smoking, secondhand smoking, E-cig, vaper, burning trash, etc.).

## 2015-01-30 NOTE — Progress Notes (Signed)
MRN# [id number] Amber Oliver [birth year]   CC: Chief Complaint  Patient presents with  . Follow-up    pt here f/u cough, pt has has a positive exp with Qvar. She says cough has almost dissolved.      Brief History: Synopsis: 64 year old past medical history of hyperlipidemia diabetes presented to [facility] 09/2014 evaluation of chronic cough. Diagnosed bronchitis December 2015, chronic cough since then, usually has one cold per year treated with supportive care. PFTs with mild restriction TLC 67, RV 48, FEV1 82 MVC 87 FEV1/FVC 72, ERV 35, DLCO 114. Placed on Qvar trial with rapid improvement.   ROV 09/2014 - Cough, intermittent since December 2015, non productive. Trial off lisinopril for 5 days, no improvement. Negative CT Chest -Plan: PFTs, 6MWT   ROV 10/24/14 Patient presents today for followup visit, she states that since her last visit she is still having intermittent episodes of cough, they are nonproductive. Further history elicited that there is no pattern to the cough, patient thinks it may be seasonal U. Maybe in the winter months. Coughing spell in short but intermittent coughing for last about 10 hour, continuous and its not everyday. Current review of medications show that she's currently on lisinopril, patient had a trial off lisinopril in the past which did not alleviate her cough. She does have a smoking history and today had her pulmonary function testing 6 minute walk test performed. Plan - stop losartan, ENT eval, if negative ENT eval then trial of ICS for possible asthma     ROV 11/29/14: Patient presents today for a follow up visit of chronic cough. Essentially has normal pfts. Had a recent coughing "spell" today, once, lasted about 1 minute, after being exposed to someone's perfume at the [place] with her Husband.  Today she stated that since her last visit her losartan has been changed to Norvasc, she had seen ENT and placed on a one month trial of  omeprazole. Overall patient states that her cough has improved slightly, now maybe once per week, and 1-2 spells when it does occur. Non productive cough.  Plan - Qvar trial.    Events since last clinic visit: Patient presents today for a follow up visit of chronic cough. At her last visit she was placed on a Qvar trial, with rapid improvement in cough. Since then only cough about 3 times, non productive. No specific triggers she could identify. No fever, no nights, no shortness of breath.      Medication:   Current Outpatient Rx  Name  Route  Sig  Dispense  Refill  . ACCU-CHEK AVIVA PLUS test strip      USE ONE STRIP TO CHECK GLUCOSE TWICE DAILY   100 each   3   . albuterol (PROVENTIL HFA;VENTOLIN HFA) 108 (90 BASE) MCG/ACT inhaler   Inhalation   Inhale 2 puffs into the lungs every 6 (six) hours as needed for wheezing or shortness of breath.   1 Inhaler   0   . aspirin 81 MG tablet   Oral   Take 1 tablet (81 mg total) by mouth daily.   30 tablet      . atorvastatin (LIPITOR) 20 MG tablet      TAKE ONE TABLET BY MOUTH ONCE DAILY   90 tablet   1   . beclomethasone (QVAR) 80 MCG/ACT inhaler   Inhalation   Inhale 1 puff into the lungs daily.   1 Inhaler   2   . chlorpheniramine-HYDROcodone (TUSSIONEX) 10-8 MG/5ML [facility]  Oral   Take 5 mLs by mouth every 12 (twelve) hours as needed.   140 mL   0   . cholecalciferol (VITAMIN D) 1000 UNITS tablet   Oral   Take 1,000 Units by mouth 2 (two) times daily.           . fish oil-omega-3 fatty acids 1000 MG capsule   Oral   Take 2 g by mouth daily. MEGA RED BRAND          . metFORMIN (GLUCOPHAGE) 500 MG tablet      TAKE ONE TABLET BY MOUTH TWICE DAILY WITH MEALS   60 tablet   5   . omeprazole (PRILOSEC) 40 MG capsule   Oral   Take 40 mg by mouth daily.         Marland Kitchen POTASSIUM PO   Oral   Take 500 mg by mouth daily.            Review of Systems: Gen:  Denies  fever, sweats, chills HEENT: Denies  blurred vision, double vision, ear pain, eye pain, hearing loss, nose bleeds, sore throat Cvc:  No dizziness, chest pain or heaviness Resp:   Admits YQ:MVHQ non productive cough Gi: Denies swallowing difficulty, stomach pain, nausea or vomiting, diarrhea, constipation, bowel incontinence Gu:  Denies bladder incontinence, burning urine Ext:   No Joint pain, stiffness or swelling Skin: No skin rash, easy bruising or bleeding or hives Endoc:  No polyuria, polydipsia , polyphagia or weight change Other:  All other systems negative  Allergies:  Ciprofloxacin; Influenza vaccines; and Sulfa drugs cross reactors  Physical Examination:  VS: BP 118/80 mmHg  Pulse 76  Temp(Src) 97.9 F (36.6 C) (Oral)  Ht 5' 4.5" (1.638 m)  Wt 175 lb 12.8 oz (79.742 kg)  BMI 29.72 kg/m2  SpO2 96%  General Appearance: No distress  HEENT: PERRLA, no ptosis, no other lesions noticed Pulmonary:normal breath sounds., diaphragmatic excursion normal.No wheezing, No rales   Cardiovascular:  Normal S1,S2.  No m/r/g.     Abdomen:Exam: Benign, Soft, non-tender, No masses  Skin:   warm, no rashes, no ecchymosis  Extremities: normal, no cyanosis, clubbing, warm with normal capillary refill.       Assessment and Plan:64 yo Female seen in follow up for chronic cough after bronchitis 07/2014, now with rapid clinical improvement since being placed on Qvar.  Cough Showed much medical improvement since starting on her Qvar trial. Most of her symptoms started after her severe bronchitis episode in December 2015. Henrene Pastor function testing and follow-up imaging have not revealed any organic cause, structural cause for her symptoms. I suspect after a bronchitis episode she may have developed bronchial hyperactivity and asthma-like symptoms which are manifesting as chronic cough. This has been clinically reduced with the use of an inhaled steroids such as Qvar. Her symptoms are mild now and we have decided to use Qvar 1 puff daily.  If the patient has increase in symptoms she is advised to increase her Qvar to 1 puff in the morning and 1 puff in the evening.  Plan Furthermore, response to therapy (other than acute cough suppression, which should only be used short term with avoidance of narcotic containing cough syrups if possible), can be a gradual process for which the patient may perceive immediate benefit.  Plan  -Continue Qvar 1 puff daily for chronic cough, may increase to 1 puff twice a day if symptoms increase. -Avoid any forms of smoke (first and smoking, secondhand smoking, E-cig, vaper, burning  trash, etc.).         Updated Medication List Outpatient Encounter Prescriptions as of 01/30/2015  Medication Sig  . ACCU-CHEK AVIVA PLUS test strip USE ONE STRIP TO CHECK GLUCOSE TWICE DAILY  . albuterol (PROVENTIL HFA;VENTOLIN HFA) 108 (90 BASE) MCG/ACT inhaler Inhale 2 puffs into the lungs every 6 (six) hours as needed for wheezing or shortness of breath.  Marland Kitchen aspirin 81 MG tablet Take 1 tablet (81 mg total) by mouth daily.  Marland Kitchen atorvastatin (LIPITOR) 20 MG tablet TAKE ONE TABLET BY MOUTH ONCE DAILY  . beclomethasone (QVAR) 80 MCG/ACT inhaler Inhale 1 puff into the lungs daily.  . chlorpheniramine-HYDROcodone (TUSSIONEX) 10-8 MG/5ML [facility] Take 5 mLs by mouth every 12 (twelve) hours as needed.  . cholecalciferol (VITAMIN D) 1000 UNITS tablet Take 1,000 Units by mouth 2 (two) times daily.    . fish oil-omega-3 fatty acids 1000 MG capsule Take 2 g by mouth daily. MEGA RED BRAND   . metFORMIN (GLUCOPHAGE) 500 MG tablet TAKE ONE TABLET BY MOUTH TWICE DAILY WITH MEALS  . omeprazole (PRILOSEC) 40 MG capsule Take 40 mg by mouth daily.  Marland Kitchen POTASSIUM PO Take 500 mg by mouth daily.  . [DISCONTINUED] amLODipine (NORVASC) 5 MG tablet Take 1 tablet (5 mg total) by mouth daily. (Patient not taking: Reported on 01/30/2015)   No facility-administered encounter medications on file as of 01/30/2015.    Orders for this visit: No  orders of the defined types were placed in this encounter.    Thank  you for the visitation and for allowing  [facility] to assist in the care of your patient. Our recommendations are noted above.  Please contact us if we can be of further service.  Vilinda Boehringer, MD [facility] Office Number: [phone]

## 2015-01-30 NOTE — Patient Instructions (Signed)
Follow up with Dr. Stevenson Clinch in 5 months - Cont with Qvar - 1 puff daily, may increase to 1 puff in am and 1 puff in PM, if cough\sob\wheezing get worst - gargle and rinse after each use. - avoid any form of smoke.

## 2015-03-10 LAB — HM DIABETES EYE EXAM

## 2015-04-19 ENCOUNTER — Other Ambulatory Visit: Payer: Self-pay | Admitting: Internal Medicine

## 2015-04-26 ENCOUNTER — Encounter: Payer: Self-pay | Admitting: Internal Medicine

## 2015-05-01 ENCOUNTER — Telehealth: Payer: Self-pay | Admitting: Internal Medicine

## 2015-05-01 NOTE — Telephone Encounter (Signed)
Pt sent a My chart message and wanted to know if she's due for lab work? Her follow up appt is scheduled already. Thank You!

## 2015-05-02 NOTE — Telephone Encounter (Signed)
Completed.

## 2015-05-02 NOTE — Telephone Encounter (Signed)
Pt was put on Dr Thomes Dinning OV schedule instead of the Lab schedule.  I checked on the 20th at 9:45 on the lab schedule and there is no appt available. Please call pt and reschedule lab appt

## 2015-05-02 NOTE — Telephone Encounter (Signed)
Labs scheduled  

## 2015-05-02 NOTE — Telephone Encounter (Signed)
Yes, needs CMP, A1c, lipids. We can either do this at visit or prior to visit.

## 2015-05-02 NOTE — Telephone Encounter (Signed)
Please advise 

## 2015-05-08 ENCOUNTER — Other Ambulatory Visit: Payer: Medicare PPO | Admitting: Internal Medicine

## 2015-05-10 ENCOUNTER — Telehealth: Payer: Self-pay | Admitting: *Deleted

## 2015-05-10 ENCOUNTER — Other Ambulatory Visit (INDEPENDENT_AMBULATORY_CARE_PROVIDER_SITE_OTHER): Payer: Medicare PPO

## 2015-05-10 DIAGNOSIS — E119 Type 2 diabetes mellitus without complications: Secondary | ICD-10-CM | POA: Diagnosis not present

## 2015-05-10 LAB — COMPREHENSIVE METABOLIC PANEL
ALBUMIN: 4.4 g/dL (ref 3.5–5.2)
ALT: 21 U/L (ref 0–35)
AST: 17 U/L (ref 0–37)
Alkaline Phosphatase: 59 U/L (ref 39–117)
BILIRUBIN TOTAL: 0.8 mg/dL (ref 0.2–1.2)
BUN: 13 mg/dL (ref 6–23)
CALCIUM: 9.5 mg/dL (ref 8.4–10.5)
CO2: 29 mEq/L (ref 19–32)
Chloride: 105 mEq/L (ref 96–112)
Creatinine, Ser: 0.73 mg/dL (ref 0.40–1.20)
GFR: 84.93 mL/min (ref >60.00)
Glucose, Bld: 96 mg/dL (ref 70–99)
Potassium: 3.9 mEq/L (ref 3.5–5.1)
Sodium: 142 mEq/L (ref 135–145)
Total Protein: 7.2 g/dL (ref 6.0–8.3)

## 2015-05-10 LAB — LIPID PANEL
Cholesterol: 161 mg/dL (ref 0–200)
HDL: 43.3 mg/dL (ref >39.00)
LDL Cholesterol: 80 mg/dL (ref 0–99)
NonHDL: 117.34
TRIGLYCERIDES: 188 mg/dL — AB (ref 0.0–149.0)
Total CHOL/HDL Ratio: 4
VLDL: 37.6 mg/dL (ref 0.0–40.0)

## 2015-05-10 LAB — HEMOGLOBIN A1C: Hgb A1c MFr Bld: 6.3 % (ref 4.6–6.5)

## 2015-05-10 NOTE — Telephone Encounter (Signed)
CMP, A1c, lipids for DM

## 2015-05-10 NOTE — Telephone Encounter (Signed)
Labs and dx?  

## 2015-05-11 ENCOUNTER — Encounter: Payer: Self-pay | Admitting: Internal Medicine

## 2015-05-11 ENCOUNTER — Ambulatory Visit
Admission: RE | Admit: 2015-05-11 | Discharge: 2015-05-11 | Disposition: A | Discharge status: Home / Self Care | Payer: Medicare PPO | Source: Ambulatory Visit | Attending: Internal Medicine | Admitting: Internal Medicine

## 2015-05-11 ENCOUNTER — Ambulatory Visit (INDEPENDENT_AMBULATORY_CARE_PROVIDER_SITE_OTHER): Payer: Medicare PPO | Admitting: Internal Medicine

## 2015-05-11 VITALS — BP 138/78 | HR 74 | Temp 97.9°F | Ht 64.5 in | Wt 177.0 lb

## 2015-05-11 DIAGNOSIS — Z23 Encounter for immunization: Secondary | ICD-10-CM

## 2015-05-11 DIAGNOSIS — E119 Type 2 diabetes mellitus without complications: Secondary | ICD-10-CM

## 2015-05-11 DIAGNOSIS — I10 Essential (primary) hypertension: Secondary | ICD-10-CM | POA: Diagnosis not present

## 2015-05-11 DIAGNOSIS — M79672 Pain in left foot: Secondary | ICD-10-CM | POA: Insufficient documentation

## 2015-05-11 DIAGNOSIS — E785 Hyperlipidemia, unspecified: Secondary | ICD-10-CM

## 2015-05-11 DIAGNOSIS — M79673 Pain in unspecified foot: Secondary | ICD-10-CM | POA: Insufficient documentation

## 2015-05-11 LAB — HM DIABETES FOOT EXAM: HM Diabetic Foot Exam: NORMAL

## 2015-05-11 MED ORDER — ATORVASTATIN CALCIUM 20 MG PO TABS: 20.0000 mg | ORAL_TABLET | Freq: Every day | ORAL | Status: DC

## 2015-05-11 NOTE — Assessment & Plan Note (Signed)
Lab Results  Component Value Date   HGBA1C 6.3 05/10/2015   BG well controlled. Encouraged continued healthy diet and exercise.

## 2015-05-11 NOTE — Progress Notes (Signed)
Pre visit review using our clinic review tool, if applicable. No additional management support is needed unless otherwise documented below in the visit note. 

## 2015-05-11 NOTE — Patient Instructions (Signed)
Please go to [facility] for xray of the left foot.

## 2015-05-11 NOTE — Progress Notes (Signed)
Subjective:    Patient ID: Amber Oliver, female    DOB: [birth year], 64 y.o.   MRN: [id number]  HPI  64YO female presents for follow up.  DM - BG well controlled.  Compliant with medications.   Left lateral foot pain - Intermittent pain. Aching. No trauma to foot. Not taking anything for pain. No redness or swelling.    Wt Readings from Last 3 Encounters:  05/11/15 177 lb (80.287 kg)  01/30/15 175 lb 12.8 oz (79.742 kg)  11/29/14 176 lb (79.833 kg)   BP Readings from Last 3 Encounters:  05/11/15 138/78  01/30/15 118/80  11/29/14 130/70     Past Medical History  Diagnosis Date  . Hyperlipidemia   . Diabetes mellitus   . Kidney stone   . Hypertension    Family History  Problem Relation Age of Onset  . Heart disease Mother   . Heart failure Mother   . Diabetes Father   . Lung cancer Father   . Diabetes Sister   . Diabetes Brother   . Cancer Maternal Grandmother     breast   Past Surgical History  Procedure Laterality Date  . Trigger finger release     Social History   Social History  . Marital Status: Married    Spouse Name: N/A  . Number of Children: N/A  . Years of Education: N/A   Social History Main Topics  . Smoking status: Former Smoker -- 0.25 packs/day for 20 years    Types: Cigarettes    Quit date: 05/09/1999  . Smokeless tobacco: Never Used  . Alcohol Use: No  . Drug Use: No  . Sexual Activity: Not Asked   Other Topics Concern  . None   Social History Narrative    Review of Systems  Constitutional: Negative for fever, chills, appetite change, fatigue and unexpected weight change.  Eyes: Negative for visual disturbance.  Respiratory: Negative for shortness of breath.   Cardiovascular: Negative for chest pain, palpitations and leg swelling.  Gastrointestinal: Negative for nausea, vomiting, abdominal pain, diarrhea and constipation.  Musculoskeletal: Positive for arthralgias. Negative for myalgias and gait problem.  Skin: Negative  for color change and rash.  Hematological: Negative for adenopathy. Does not bruise/bleed easily.  Psychiatric/Behavioral: Negative for sleep disturbance and dysphoric mood. The patient is not nervous/anxious.        Objective:    BP 138/78 mmHg  Pulse 74  Temp(Src) 97.9 F (36.6 C) (Oral)  Ht 5' 4.5" (1.638 m)  Wt 177 lb (80.287 kg)  BMI 29.92 kg/m2  SpO2 99% Physical Exam  Constitutional: She is oriented to person, place, and time. She appears well-developed and well-nourished. No distress.  HENT:  Head: Normocephalic and atraumatic.  Right Ear: External ear normal.  Left Ear: External ear normal.  Nose: Nose normal.  Mouth/Throat: Oropharynx is clear and moist. No oropharyngeal exudate.  Eyes: Conjunctivae are normal. Pupils are equal, round, and reactive to light. Right eye exhibits no discharge. Left eye exhibits no discharge. No scleral icterus.  Neck: Normal range of motion. Neck supple. No tracheal deviation present. No thyromegaly present.  Cardiovascular: Normal rate, regular rhythm, normal heart sounds and intact distal pulses.  Exam reveals no gallop and no friction rub.   No murmur heard. Pulmonary/Chest: Effort normal and breath sounds normal. No respiratory distress. She has no wheezes. She has no rales. She exhibits no tenderness.  Musculoskeletal: Normal range of motion. She exhibits no edema or tenderness.  Left foot: There is normal range of motion, no tenderness, no bony tenderness, no swelling and no deformity.       Feet:  Lymphadenopathy:    She has no cervical adenopathy.  Neurological: She is alert and oriented to person, place, and time. No cranial nerve deficit. She exhibits normal muscle tone. Coordination normal.  Skin: Skin is warm and dry. No rash noted. She is not diaphoretic. No erythema. No pallor.  Psychiatric: She has a normal mood and affect. Her behavior is normal. Judgment and thought content normal.          Assessment & Plan:     Problem List Items Addressed This Visit      Unprioritized   Diabetes type 2, controlled - Primary    Lab Results  Component Value Date   HGBA1C 6.3 05/10/2015   BG well controlled. Encouraged continued healthy diet and exercise.      Relevant Medications   atorvastatin (LIPITOR) 20 MG tablet   Hyperlipidemia LDL goal <70    Recent lipids well controlled. Continue Atorvastatin.      Relevant Medications   atorvastatin (LIPITOR) 20 MG tablet   Hypertension    BP Readings from Last 3 Encounters:  05/11/15 138/78  01/30/15 118/80  11/29/14 130/70   BP well controlled. Renal function with labs. Continue diet control.      Relevant Medications   atorvastatin (LIPITOR) 20 MG tablet   Left foot pain    Left lateral foot pain. Exam normal. Will get plain xray for evaluation. Discussed potential referral to podiatry for custom orthotics if persistent symptoms.      Relevant Orders   DG Foot Complete Left       Return in about 4 weeks (around 06/08/2015) for [facility] to [facility] Visit.

## 2015-05-11 NOTE — Addendum Note (Signed)
Addended by: Vernetta Honey on: 05/11/2015 04:54 PM   Modules accepted: Orders

## 2015-05-11 NOTE — Assessment & Plan Note (Signed)
Left lateral foot pain. Exam normal. Will get plain xray for evaluation. Discussed potential referral to podiatry for custom orthotics if persistent symptoms.

## 2015-05-11 NOTE — Assessment & Plan Note (Signed)
Recent lipids well controlled. Continue Atorvastatin.

## 2015-05-11 NOTE — Assessment & Plan Note (Signed)
BP Readings from Last 3 Encounters:  05/11/15 138/78  01/30/15 118/80  11/29/14 130/70   BP well controlled. Renal function with labs. Continue diet control.

## 2015-06-04 ENCOUNTER — Encounter: Payer: Self-pay | Admitting: Internal Medicine

## 2015-06-06 ENCOUNTER — Encounter: Payer: Self-pay | Admitting: Internal Medicine

## 2015-06-06 ENCOUNTER — Ambulatory Visit (INDEPENDENT_AMBULATORY_CARE_PROVIDER_SITE_OTHER): Payer: Medicare PPO | Admitting: Internal Medicine

## 2015-06-06 VITALS — BP 136/85 | HR 81 | Temp 98.0°F | Ht 64.5 in | Wt 174.1 lb

## 2015-06-06 DIAGNOSIS — Z0000 Encounter for general adult medical examination without abnormal findings: Secondary | ICD-10-CM

## 2015-06-06 NOTE — Patient Instructions (Signed)
Mediterranean Style Diet    Why follow it? Research shows. . Those who follow the Mediterranean diet have a reduced risk of heart disease  . The diet is associated with a reduced incidence of Parkinson's and Alzheimer's diseases . People following the diet may have longer life expectancies and lower rates of chronic diseases  . The Dietary Guidelines for Americans recommends the Mediterranean diet as an eating plan to promote health and prevent disease  What Is the Mediterranean Diet?  . Healthy eating plan based on typical foods and recipes of Mediterranean-style cooking . The diet is primarily a plant based diet; these foods should make up a majority of meals   Starches - Plant based foods should make up a majority of meals - They are an important sources of vitamins, minerals, energy, antioxidants, and fiber - Choose whole grains, foods high in fiber and minimally processed items  - Typical grain sources include wheat, oats, barley, corn, brown rice, bulgar, farro, millet, polenta, couscous  - Various types of beans include chickpeas, lentils, fava beans, black beans, white beans   Fruits  Veggies - Large quantities of antioxidant rich fruits & veggies; 6 or more servings  - Vegetables can be eaten raw or lightly drizzled with oil and cooked  - Vegetables common to the traditional Mediterranean Diet include: artichokes, arugula, beets, broccoli, brussel sprouts, cabbage, carrots, celery, collard greens, cucumbers, eggplant, kale, leeks, lemons, lettuce, mushrooms, okra, onions, peas, peppers, potatoes, pumpkin, radishes, rutabaga, shallots, spinach, sweet potatoes, turnips, zucchini - Fruits common to the Mediterranean Diet include: apples, apricots, avocados, cherries, clementines, dates, figs, grapefruits, grapes, melons, nectarines, oranges, peaches, pears, pomegranates, strawberries, tangerines  Fats - Replace butter and margarine with healthy oils, such as olive oil, canola oil, and  tahini  - Limit nuts to no more than a handful a day  - Nuts include walnuts, almonds, pecans, pistachios, pine nuts  - Limit or avoid candied, honey roasted or heavily salted nuts - Olives are central to the [employer] - can be eaten whole or used in a variety of dishes   Meats Protein - Limiting red meat: no more than a few times a month - When eating red meat: choose lean cuts and keep the portion to the size of deck of cards - Eggs: approx. 0 to 4 times a week  - Fish and lean poultry: at least 2 a week  - Healthy protein sources include, chicken, [place], lean beef, lamb - Increase intake of seafood such as tuna, salmon, trout, mackerel, shrimp, scallops - Avoid or limit high fat processed meats such as sausage and bacon  Dairy - Include moderate amounts of low fat dairy products  - Focus on healthy dairy such as fat free yogurt, skim milk, low or reduced fat cheese - Limit dairy products higher in fat such as whole or 2% milk, cheese, ice cream  Alcohol - Moderate amounts of red wine is ok  - No more than 5 oz daily for women (all ages) and men older than age 19  - No more than 10 oz of wine daily for men younger than 34  Other - Limit sweets and other desserts  - Use herbs and spices instead of salt to flavor foods  - Herbs and spices common to the traditional Mediterranean Diet include: basil, bay leaves, chives, cloves, cumin, fennel, garlic, lavender, marjoram, mint, oregano, parsley, pepper, rosemary, sage, savory, sumac, tarragon, thyme   It's not just a diet, it's a lifestyle:  .  The Mediterranean diet includes lifestyle factors typical of those in the region  . Foods, drinks and meals are best eaten with others and savored . Daily physical activity is important for overall good health . This could be strenuous exercise like running and aerobics . This could also be more leisurely activities such as walking, housework, yard-work, or taking the stairs . Moderation is  the key; a balanced and healthy diet accommodates most foods and drinks . Consider portion sizes and frequency of consumption of certain foods   Meal Ideas & Options:  . Breakfast:  o Whole wheat toast or whole wheat English muffins with peanut butter & hard boiled egg o Steel cut oats topped with apples & cinnamon and skim milk  o Fresh fruit: banana, strawberries, melon, berries, peaches  o Smoothies: strawberries, bananas, greek yogurt, peanut butter o Low fat greek yogurt with blueberries and granola  o Egg white omelet with spinach and mushrooms o Breakfast couscous: whole wheat couscous, apricots, skim milk, cranberries  . Sandwiches:  o Hummus and grilled vegetables (peppers, zucchini, squash) on whole wheat bread   o Grilled chicken on whole wheat pita with lettuce, tomatoes, cucumbers or tzatziki  o Tuna salad on whole wheat bread: tuna salad made with greek yogurt, olives, red peppers, capers, green onions o Garlic rosemary lamb pita: lamb sauted with garlic, rosemary, salt & pepper; add lettuce, cucumber, greek yogurt to pita - flavor with lemon juice and black pepper  . Seafood:  o Mediterranean grilled salmon, seasoned with garlic, basil, parsley, lemon juice and black pepper o Shrimp, lemon, and spinach whole-grain pasta salad made with low fat greek yogurt  o Seared scallops with lemon orzo  o Seared tuna steaks seasoned salt, pepper, coriander topped with tomato mixture of olives, tomatoes, olive oil, minced garlic, parsley, green onions and cappers  . Meats:  o Herbed greek chicken salad with kalamata olives, cucumber, feta  o Red bell peppers stuffed with spinach, bulgur, lean ground beef (or lentils) & topped with feta   o Kebabs: skewers of chicken, tomatoes, onions, zucchini, squash  o [place] burgers: made with red onions, mint, dill, lemon juice, feta cheese topped with roasted red peppers . Vegetarian o Cucumber salad: cucumbers, artichoke hearts, celery, red  onion, feta cheese, tossed in olive oil & lemon juice  o Hummus and whole grain pita points with a greek salad (lettuce, tomato, feta, olives, cucumbers, red onion) o Lentil soup with celery, carrots made with vegetable broth, garlic, salt and pepper  o Tabouli salad: parsley, bulgur, mint, scallions, cucumbers, tomato, radishes, lemon juice, olive oil, salt and pepper.     Health Maintenance, Female Adopting a healthy lifestyle and getting preventive care can go a long way to promote health and wellness. Talk with your health care provider about what schedule of regular examinations is right for you. This is a good chance for you to check in with your provider about disease prevention and staying healthy. In between checkups, there are plenty of things you can do on your own. Experts have done a lot of research about which lifestyle changes and preventive measures are most likely to keep you healthy. Ask your health care provider for more information. WEIGHT AND DIET  Eat a healthy diet  Be sure to include plenty of vegetables, fruits, low-fat dairy products, and lean protein.  Do not eat a lot of foods high in solid fats, added sugars, or salt.  Get regular exercise. This is one of  the most important things you can do for your health.  Most adults should exercise for at least 150 minutes each week. The exercise should increase your heart rate and make you sweat (moderate-intensity exercise).  Most adults should also do strengthening exercises at least twice a week. This is in addition to the moderate-intensity exercise.  Maintain a healthy weight  Body mass index (BMI) is a measurement that can be used to identify possible weight problems. It estimates body fat based on height and weight. Your health care provider can help determine your BMI and help you achieve or maintain a healthy weight.  For females 62 years of age and older:   A BMI below 18.5 is considered underweight.  A BMI of  18.5 to 24.9 is normal.  A BMI of 25 to 29.9 is considered overweight.  A BMI of 30 and above is considered obese.  Watch levels of cholesterol and blood lipids  You should start having your blood tested for lipids and cholesterol at 66 years of age, then have this test every 5 years.  You may need to have your cholesterol levels checked more often if:  Your lipid or cholesterol levels are high.  You are older than 65 years of age.  You are at high risk for heart disease.  CANCER SCREENING   Lung Cancer  Lung cancer screening is recommended for adults 38-50 years old who are at high risk for lung cancer because of a history of smoking.  A yearly low-dose CT scan of the lungs is recommended for people who:  Currently smoke.  Have quit within the past 15 years.  Have at least a 30-pack-year history of smoking. A pack year is smoking an average of one pack of cigarettes a day for 1 year.  Yearly screening should continue until it has been 15 years since you quit.  Yearly screening should stop if you develop a health problem that would prevent you from having lung cancer treatment.  Breast Cancer  Practice breast self-awareness. This means understanding how your breasts normally appear and feel.  It also means doing regular breast self-exams. Let your health care provider know about any changes, no matter how small.  If you are in your 20s or 30s, you should have a clinical breast exam (CBE) by a health care provider every 1-3 years as part of a regular health exam.  If you are 65 or older, have a CBE every year. Also consider having a breast X-ray (mammogram) every year.  If you have a family history of breast cancer, talk to your health care provider about genetic screening.  If you are at high risk for breast cancer, talk to your health care provider about having an MRI and a mammogram every year.  Breast cancer gene (BRCA) assessment is recommended for women who  have family members with BRCA-related cancers. BRCA-related cancers include:  Breast.  Ovarian.  Tubal.  Peritoneal cancers.  Results of the assessment will determine the need for genetic counseling and BRCA1 and BRCA2 testing. Cervical Cancer Your health care provider may recommend that you be screened regularly for cancer of the pelvic organs (ovaries, uterus, and vagina). This screening involves a pelvic examination, including checking for microscopic changes to the surface of your cervix (Pap test). You may be encouraged to have this screening done every 3 years, beginning at age 42.  For women ages 63-65, health care providers may recommend pelvic exams and Pap testing every 3 years, or  they may recommend the Pap and pelvic exam, combined with testing for human papilloma virus (HPV), every 5 years. Some types of HPV increase your risk of cervical cancer. Testing for HPV may also be done on women of any age with unclear Pap test results.  Other health care providers may not recommend any screening for nonpregnant women who are considered low risk for pelvic cancer and who do not have symptoms. Ask your health care provider if a screening pelvic exam is right for you.  If you have had past treatment for cervical cancer or a condition that could lead to cancer, you need Pap tests and screening for cancer for at least 20 years after your treatment. If Pap tests have been discontinued, your risk factors (such as having a new sexual partner) need to be reassessed to determine if screening should resume. Some women have medical problems that increase the chance of getting cervical cancer. In these cases, your health care provider may recommend more frequent screening and Pap tests. Colorectal Cancer  This type of cancer can be detected and often prevented.  Routine colorectal cancer screening usually begins at 65 years of age and continues through 65 years of age.  Your health care provider  may recommend screening at an earlier age if you have risk factors for colon cancer.  Your health care provider may also recommend using home test kits to check for hidden blood in the stool.  A small camera at the end of a tube can be used to examine your colon directly (sigmoidoscopy or colonoscopy). This is done to check for the earliest forms of colorectal cancer.  Routine screening usually begins at age 76.  Direct examination of the colon should be repeated every 5-10 years through 65 years of age. However, you may need to be screened more often if early forms of precancerous polyps or small growths are found. Skin Cancer  Check your skin from head to toe regularly.  Tell your health care provider about any new moles or changes in moles, especially if there is a change in a mole's shape or color.  Also tell your health care provider if you have a mole that is larger than the size of a pencil eraser.  Always use sunscreen. Apply sunscreen liberally and repeatedly throughout the day.  Protect yourself by wearing long sleeves, pants, a wide-brimmed hat, and sunglasses whenever you are outside. HEART DISEASE, DIABETES, AND HIGH BLOOD PRESSURE   High blood pressure causes heart disease and increases the risk of stroke. High blood pressure is more likely to develop in:  People who have blood pressure in the high end of the normal range (130-139/85-89 mm Hg).  People who are overweight or obese.  People who are African American.  If you are 25-54 years of age, have your blood pressure checked every 3-5 years. If you are 15 years of age or older, have your blood pressure checked every year. You should have your blood pressure measured twice--once when you are at a hospital or clinic, and once when you are not at a hospital or clinic. Record the average of the two measurements. To check your blood pressure when you are not at a hospital or clinic, you can use:  An automated blood  pressure machine at a pharmacy.  A home blood pressure monitor.  If you are between 85 years and 46 years old, ask your health care provider if you should take aspirin to prevent strokes.  Have regular  diabetes screenings. This involves taking a blood sample to check your fasting blood sugar level.  If you are at a normal weight and have a low risk for diabetes, have this test once every three years after 65 years of age.  If you are overweight and have a high risk for diabetes, consider being tested at a younger age or more often. PREVENTING INFECTION  Hepatitis B  If you have a higher risk for hepatitis B, you should be screened for this virus. You are considered at high risk for hepatitis B if:  You were born in a country where hepatitis B is common. Ask your health care provider which countries are considered high risk.  Your parents were born in a high-risk country, and you have not been immunized against hepatitis B (hepatitis B vaccine).  You have HIV or AIDS.  You use needles to inject street drugs.  You live with someone who has hepatitis B.  You have had sex with someone who has hepatitis B.  You get hemodialysis treatment.  You take certain medicines for conditions, including cancer, organ transplantation, and autoimmune conditions. Hepatitis C  Blood testing is recommended for:  Everyone born from [birth year] through 1966.  Anyone with known risk factors for hepatitis C. Sexually transmitted infections (STIs)  You should be screened for sexually transmitted infections (STIs) including gonorrhea and chlamydia if:  You are sexually active and are younger than 65 years of age.  You are older than 65 years of age and your health care provider tells you that you are at risk for this type of infection.  Your sexual activity has changed since you were last screened and you are at an increased risk for chlamydia or gonorrhea. Ask your health care provider if you are at  risk.  If you do not have HIV, but are at risk, it may be recommended that you take a prescription medicine daily to prevent HIV infection. This is called pre-exposure prophylaxis (PrEP). You are considered at risk if:  You are sexually active and do not regularly use condoms or know the HIV status of your partner(s).  You take drugs by injection.  You are sexually active with a partner who has HIV. Talk with your health care provider about whether you are at high risk of being infected with HIV. If you choose to begin PrEP, you should first be tested for HIV. You should then be tested every 3 months for as long as you are taking PrEP.  PREGNANCY   If you are premenopausal and you may become pregnant, ask your health care provider about preconception counseling.  If you may become pregnant, take 400 to 800 micrograms (mcg) of folic acid every day.  If you want to prevent pregnancy, talk to your health care provider about birth control (contraception). OSTEOPOROSIS AND MENOPAUSE   Osteoporosis is a disease in which the bones lose minerals and strength with aging. This can result in serious bone fractures. Your risk for osteoporosis can be identified using a bone density scan.  If you are 29 years of age or older, or if you are at risk for osteoporosis and fractures, ask your health care provider if you should be screened.  Ask your health care provider whether you should take a calcium or vitamin D supplement to lower your risk for osteoporosis.  Menopause may have certain physical symptoms and risks.  Hormone replacement therapy may reduce some of these symptoms and risks. Talk to your health care  provider about whether hormone replacement therapy is right for you.  HOME CARE INSTRUCTIONS   Schedule regular health, dental, and eye exams.  Stay current with your immunizations.   Do not use any tobacco products including cigarettes, chewing tobacco, or electronic cigarettes.  If  you are pregnant, do not drink alcohol.  If you are breastfeeding, limit how much and how often you drink alcohol.  Limit alcohol intake to no more than 1 drink per day for nonpregnant women. One drink equals 12 ounces of beer, 5 ounces of wine, or 1 ounces of hard liquor.  Do not use street drugs.  Do not share needles.  Ask your health care provider for help if you need support or information about quitting drugs.  Tell your health care provider if you often feel depressed.  Tell your health care provider if you have ever been abused or do not feel safe at home.   This information is not intended to replace advice given to you by your health care provider. Make sure you discuss any questions you have with your health care provider.   Document Released: 02/17/2011 Document Revised: 08/25/2014 Document Reviewed: 07/06/2013 Elsevier Interactive Patient Education [employer].

## 2015-06-06 NOTE — Assessment & Plan Note (Signed)
General medical exam normal today. PAP and pelvic UTD and follow up scheduled next month. Mammogram UTD and follow up scheduled next month. Colonoscopy UTD and reviewed. Immunizations UTD. Eye exam UTD. Encouraged healthy diet and discussed Mediterranean style diet. Encouraged exercise such as walking. Encouraged her to set up HCPOA and Living will. Reviewed recent labs. Follow up in 6 months and prn.

## 2015-06-06 NOTE — Progress Notes (Signed)
The patient is here for annual Medicare Wellness Examination and management of other chronic and acute problems.   The risk factors are reflected in the history.  The roster of all physicians providing medical care to patient - is listed in the Snapshot section of the chart.  Activities of daily living:   The patient is 100% independent in all ADLs: dressing, toileting, feeding as well as independent mobility. Patient lives with husband and daughter, son-in-law, and grandchild. 2 story home. Hard floors. No falls.  Home safety :  The patient has smoke detectors in the home.  They wear seatbelts in their car. There are no firearms at home.  There is no violence in the home. They feel safe where they live.  Infectious Risks: There is no risks for hepatitis, STDs or HIV.  There is no  history of blood transfusion.  They have no travel history to infectious disease endemic areas of the world.  Additional Health Care Providers: The patient has seen their dentist in the last six months. Dentist - Dr. Leanor Kail They have seen their eye doctor in the last year. Opthalmologist - Dr. Verline Lema in [place]. They deny hearing issues. They have deferred audiologic testing in the last year.   They do not  have excessive sun exposure. Discussed the need for sun protection: hats,long sleeves and use of sunscreen if there is significant sun exposure.  Dermatologist - Dr. Aubery Lapping GYN - Dr. Everlena Cooper  Diet: the importance of a healthy diet is discussed. They do have a healthy diet. Notes some recent weight gain.  Wt Readings from Last 3 Encounters:  06/06/15 174 lb 2 oz (78.983 kg)  05/11/15 177 lb (80.287 kg)  01/30/15 175 lb 12.8 oz (79.742 kg)    The benefits of regular aerobic exercise were discussed. Patient exercises by going to gym at least 1-2 times per week. Also walks some.  Depression screen: there are no signs or vegative symptoms of depression- irritability,  change in appetite, anhedonia, sadness/tearfullness.  Cognitive assessment: the patient manages all their financial and personal affairs and is actively engaged. They could relate day,date,year and events.  HCPOA - none Living Will - none  The following portions of the patient's history were reviewed and updated as appropriate: allergies, current medications, past family history, past medical history,  past surgical history, past social history and problem list.  Visual acuity was not assessed per patient preference as they have regular follow up with their ophthalmologist. Hearing and body mass index were assessed and reviewed.   During the course of the visit the patient was educated and counseled about appropriate screening and preventive services including : fall prevention , diabetes screening, nutrition counseling, colorectal cancer screening, and recommended immunizations.    Past Medical History  Diagnosis Date  . Hyperlipidemia   . Diabetes mellitus   . Kidney stone   . Hypertension    Family History  Problem Relation Age of Onset  . Heart disease Mother   . Heart failure Mother   . Diabetes Father   . Lung cancer Father   . Diabetes Sister   . Diabetes Brother   . Cancer Maternal Grandmother     breast   Social History   Social History  . Marital Status: Married    Spouse Name: N/A  . Number of Children: N/A  . Years of Education: N/A   Social History Main Topics  . Smoking status: Former Smoker -- 0.25 packs/day for 20  years    Types: Cigarettes    Quit date: 05/09/1999  . Smokeless tobacco: Never Used  . Alcohol Use: No  . Drug Use: No  . Sexual Activity: Not Asked   Other Topics Concern  . None   Social History Narrative   Past Surgical History  Procedure Laterality Date  . Trigger finger release      Review of Systems  Constitutional: Negative for fever, chills, appetite change, fatigue and unexpected weight change.  Eyes: Negative for visual  disturbance.  Respiratory: Negative for shortness of breath.   Cardiovascular: Negative for chest pain and leg swelling.  Gastrointestinal: Negative for abdominal pain.  Skin: Negative for color change and rash.  Hematological: Negative for adenopathy. Does not bruise/bleed easily.  Psychiatric/Behavioral: Negative for dysphoric mood. The patient is not nervous/anxious.        Objective:    BP 136/85 mmHg  Pulse 81  Temp(Src) 98 F (36.7 C) (Oral)  Ht 5' 4.5" (1.638 m)  Wt 174 lb 2 oz (78.983 kg)  BMI 29.44 kg/m2  SpO2 98% Physical Exam  Constitutional: She is oriented to person, place, and time. She appears well-developed and well-nourished. No distress.  HENT:  Head: Normocephalic and atraumatic.  Right Ear: External ear normal.  Left Ear: External ear normal.  Nose: Nose normal.  Mouth/Throat: Oropharynx is clear and moist. No oropharyngeal exudate.  Eyes: Conjunctivae are normal. Pupils are equal, round, and reactive to light. Right eye exhibits no discharge. Left eye exhibits no discharge. No scleral icterus.  Neck: Normal range of motion. Neck supple. No tracheal deviation present. No thyromegaly present.  Cardiovascular: Normal rate, regular rhythm, normal heart sounds and intact distal pulses.  Exam reveals no gallop and no friction rub.   No murmur heard. Pulmonary/Chest: Effort normal and breath sounds normal. No respiratory distress. She has no wheezes. She has no rales. She exhibits no tenderness.  Abdominal: Soft. Bowel sounds are normal. She exhibits no distension and no mass. There is no tenderness. There is no rebound and no guarding.  Musculoskeletal: Normal range of motion. She exhibits no edema or tenderness.  Lymphadenopathy:    She has no cervical adenopathy.  Neurological: She is alert and oriented to person, place, and time. No cranial nerve deficit. She exhibits normal muscle tone. Coordination normal.  Skin: Skin is warm and dry. No rash noted. She is  not diaphoretic. No erythema. No pallor.  Psychiatric: She has a normal mood and affect. Her behavior is normal. Judgment and thought content normal.          Assessment & Plan:  Patient was given a handout regarding current recommendations for health maintenance and preventative care on the AVS.  Problem List Items Addressed This Visit      Unprioritized   Medicare annual wellness visit, subsequent - Primary    General medical exam normal today. PAP and pelvic UTD and follow up scheduled next month. Mammogram UTD and follow up scheduled next month. Colonoscopy UTD and reviewed. Immunizations UTD. Eye exam UTD. Encouraged healthy diet and discussed Mediterranean style diet. Encouraged exercise such as walking. Encouraged her to set up HCPOA and Living will. Reviewed recent labs. Follow up in 6 months and prn.          Return in about 6 months (around 12/05/2015) for Recheck of Diabetes.

## 2015-06-06 NOTE — Progress Notes (Signed)
Pre visit review using our clinic review tool, if applicable. No additional management support is needed unless otherwise documented below in the visit note. 

## 2015-06-28 ENCOUNTER — Other Ambulatory Visit: Payer: Self-pay | Admitting: Internal Medicine

## 2015-07-06 LAB — HM MAMMOGRAPHY: HM MAMMO: NEGATIVE

## 2015-07-19 ENCOUNTER — Ambulatory Visit (INDEPENDENT_AMBULATORY_CARE_PROVIDER_SITE_OTHER): Payer: Medicare PPO | Admitting: Internal Medicine

## 2015-07-19 ENCOUNTER — Encounter: Payer: Self-pay | Admitting: Internal Medicine

## 2015-07-19 VITALS — BP 128/74 | HR 80 | Ht 64.0 in | Wt 177.0 lb

## 2015-07-19 DIAGNOSIS — J4520 Mild intermittent asthma, uncomplicated: Secondary | ICD-10-CM

## 2015-07-19 DIAGNOSIS — J45909 Unspecified asthma, uncomplicated: Secondary | ICD-10-CM | POA: Insufficient documentation

## 2015-07-19 NOTE — Patient Instructions (Signed)
Follow up with Dr. Stevenson Clinch in 6 months - Cont with Qvar - 1 puff daily, may increase to 1 puff in am and 1 puff in PM, if cough\sob\wheezing get worst - gargle and rinse after each use. - avoid any form of smoke.

## 2015-07-19 NOTE — Assessment & Plan Note (Signed)
Showed much clinical improvement since starting Qvar Most of her symptoms started after her severe bronchitis episode in December 2015. PFT  testing and follow-up imaging have not revealed any organic cause or structural cause for her symptoms. I suspect after the bronchitis episode she may have developed bronchial hyperactivity and asthma-like symptoms which are manifesting as chronic cough, or cough variant asthma. This has been clinically reduced with the use of an inhaled steroid such as Qvar. Her symptoms are mild now and we have decided to use Qvar 1 puff daily. If the patient has increase in symptoms she is advised to increase her Qvar to 1 puff in the morning and 1 puff in the evening.   Plan  -Continue Qvar 1 puff daily, may increase to 1 puff twice a day if symptoms increase. -Avoid any forms of smoke (first and smoking, secondhand smoking, E-cig, vaper, burning trash, etc.).

## 2015-07-19 NOTE — Progress Notes (Signed)
MRN# [id number] Amber Oliver Jan 01, 1951   CC: Chief Complaint  Patient presents with  . Follow-up    pt. states she feels cough has improved with qvar. occ. dry cough. denies SOB, wheezing or chest pain/tightness.      Brief History: Synopsis: 64 year old past medical history of hyperlipidemia diabetes presented to [facility] 09/2014 evaluation of chronic cough. Diagnosed bronchitis December 2015, chronic cough since then, usually has one cold per year treated with supportive care. PFTs with mild restriction TLC 67, RV 48, FEV1 82 MVC 87 FEV1/FVC 72, ERV 35, DLCO 114. Placed on Qvar trial with rapid improvement.   ROV 09/2014 - Cough, intermittent since December 2015, non productive. Trial off lisinopril for 5 days, no improvement. Negative CT Chest -Plan: PFTs, 6MWT   ROV 10/24/14 Patient presents today for followup visit, she states that since her last visit she is still having intermittent episodes of cough, they are nonproductive. Further history elicited that there is no pattern to the cough, patient thinks it may be seasonal U. Maybe in the winter months. Coughing spell in short but intermittent coughing for last about 10 hour, continuous and its not everyday. Current review of medications show that she's currently on lisinopril, patient had a trial off lisinopril in the past which did not alleviate her cough. She does have a smoking history and today had her pulmonary function testing 6 minute walk test performed. Plan - stop losartan, ENT eval, if negative ENT eval then trial of ICS for possible asthma     ROV 11/29/14: Patient presents today for a follow up visit of chronic cough. Essentially has normal pfts. Had a recent coughing "spell" today, once, lasted about 1 minute, after being exposed to someone's perfume at the [place] with her Husband.  Today she stated that since her last visit her losartan has been changed to Norvasc, she had seen ENT and placed on a one month  trial of omeprazole. Overall patient states that her cough has improved slightly, now maybe once per week, and 1-2 spells when it does occur. Non productive cough.  Plan - Qvar trial.    ROV 01/2015: Patient presents today for a follow up visit of chronic cough. At her last visit she was placed on a Qvar trial, with rapid improvement in cough. Since then only cough about 3 times, non productive. No specific triggers she could identify. No fever, no nights, no shortness of breath.   Plan - significant improvement with Qvar, will continue  Events Since last clinic visit Patient presents today for follow-up visit of chronic cough/cough variant asthma. Since her last visit she has been doing well, only use albuterol once. She's been on Qvar 1 puff daily, this is been controlling her cough and shortness of breath. Overall patient with significant clinical improvement since being on Qvar.   Medication:   Current Outpatient Rx  Name  Route  Sig  Dispense  Refill  . ACCU-CHEK AVIVA PLUS test strip      USE ONE STRIP TO CHECK GLUCOSE TWICE DAILY   100 each   3   . albuterol (PROVENTIL HFA;VENTOLIN HFA) 108 (90 BASE) MCG/ACT inhaler   Inhalation   Inhale 2 puffs into the lungs every 6 (six) hours as needed for wheezing or shortness of breath.   1 Inhaler   0   . aspirin 81 MG tablet   Oral   Take 1 tablet (81 mg total) by mouth daily.   30 tablet      .  atorvastatin (LIPITOR) 20 MG tablet   Oral   Take 1 tablet (20 mg total) by mouth daily.   90 tablet   3   . beclomethasone (QVAR) 80 MCG/ACT inhaler   Inhalation   Inhale 1 puff into the lungs daily.   1 Inhaler   2   . chlorpheniramine-HYDROcodone (TUSSIONEX) 10-8 MG/5ML LQCR   Oral   Take 5 mLs by mouth every 12 (twelve) hours as needed.   140 mL   0   . cholecalciferol (VITAMIN D) 1000 UNITS tablet   Oral   Take 1,000 Units by mouth 2 (two) times daily.           Marland Kitchen CINNAMON PO   Oral   Take by mouth.          . fish oil-omega-3 fatty acids 1000 MG capsule   Oral   Take 2 g by mouth daily. MEGA RED BRAND          . metFORMIN (GLUCOPHAGE) 500 MG tablet      TAKE ONE TABLET BY MOUTH TWICE DAILY WITH MEALS   60 tablet   2   . omeprazole (PRILOSEC) 40 MG capsule   Oral   Take 40 mg by mouth daily.         Marland Kitchen POTASSIUM PO   Oral   Take 500 mg by mouth daily.            Review of Systems: Gen:  Denies  fever, sweats, chills HEENT: Denies blurred vision, double vision, ear pain, eye pain, hearing loss, nose bleeds, sore throat Cvc:  No dizziness, chest pain or heaviness Resp:   Admits [id number] non productive cough Gi: Denies swallowing difficulty, stomach pain, nausea or vomiting, diarrhea, constipation, bowel incontinence Gu:  Denies bladder incontinence, burning urine Ext:   No Joint pain, stiffness or swelling Skin: No skin rash, easy bruising or bleeding or hives Endoc:  No polyuria, polydipsia , polyphagia or weight change Other:  All other systems negative  Allergies:  Ciprofloxacin and Sulfa drugs cross reactors  Physical Examination:  VS: BP 128/74 mmHg  Pulse 80  Ht 5\' 4"  (1.626 m)  Wt 177 lb (80.287 kg)  BMI 30.37 kg/m2  SpO2 97%  General Appearance: No distress  HEENT: PERRLA, no ptosis, no other lesions noticed Pulmonary:normal breath sounds., diaphragmatic excursion normal.No wheezing, No rales   Cardiovascular:  Normal S1,S2.  No m/r/g.     Abdomen:Exam: Benign, Soft, non-tender, No masses  Skin:   warm, no rashes, no ecchymosis  Extremities: normal, no cyanosis, clubbing, warm with normal capillary refill.       Assessment and Plan:64 yo Female seen in follow up for chronic cough after bronchitis 07/2014, now with rapid clinical improvement since being placed on Qvar.  Extrinsic asthma Showed much clinical improvement since starting Qvar Most of her symptoms started after her severe bronchitis episode in December 2015. PFT  testing and follow-up  imaging have not revealed any organic cause or structural cause for her symptoms. I suspect after the bronchitis episode she may have developed bronchial hyperactivity and asthma-like symptoms which are manifesting as chronic cough, or cough variant asthma. This has been clinically reduced with the use of an inhaled steroid such as Qvar. Her symptoms are mild now and we have decided to use Qvar 1 puff daily. If the patient has increase in symptoms she is advised to increase her Qvar to 1 puff in the morning and 1 puff in the evening.  Plan  -Continue Qvar 1 puff daily, may increase to 1 puff twice a day if symptoms increase. -Avoid any forms of smoke (first and smoking, secondhand smoking, E-cig, vaper, burning trash, etc.).           Updated Medication List Outpatient Encounter Prescriptions as of 07/19/2015  Medication Sig  . ACCU-CHEK AVIVA PLUS test strip USE ONE STRIP TO CHECK GLUCOSE TWICE DAILY  . albuterol (PROVENTIL HFA;VENTOLIN HFA) 108 (90 BASE) MCG/ACT inhaler Inhale 2 puffs into the lungs every 6 (six) hours as needed for wheezing or shortness of breath.  Marland Kitchen aspirin 81 MG tablet Take 1 tablet (81 mg total) by mouth daily.  Marland Kitchen atorvastatin (LIPITOR) 20 MG tablet Take 1 tablet (20 mg total) by mouth daily.  . beclomethasone (QVAR) 80 MCG/ACT inhaler Inhale 1 puff into the lungs daily.  . chlorpheniramine-HYDROcodone (TUSSIONEX) 10-8 MG/5ML LQCR Take 5 mLs by mouth every 12 (twelve) hours as needed.  . cholecalciferol (VITAMIN D) 1000 UNITS tablet Take 1,000 Units by mouth 2 (two) times daily.    Marland Kitchen CINNAMON PO Take by mouth.  . fish oil-omega-3 fatty acids 1000 MG capsule Take 2 g by mouth daily. MEGA RED BRAND   . metFORMIN (GLUCOPHAGE) 500 MG tablet TAKE ONE TABLET BY MOUTH TWICE DAILY WITH MEALS  . omeprazole (PRILOSEC) 40 MG capsule Take 40 mg by mouth daily.  Marland Kitchen POTASSIUM PO Take 500 mg by mouth daily.   No facility-administered encounter medications on file as of  07/19/2015.    Orders for this visit: No orders of the defined types were placed in this encounter.    Thank  you for the visitation and for allowing  [facility] to assist in the care of your patient. Our recommendations are noted above.  Please contact us if we can be of further service.  Vilinda Boehringer, MD [facility] Office Number: [phone]

## 2015-08-20 ENCOUNTER — Encounter: Payer: Self-pay | Admitting: Internal Medicine

## 2015-08-22 ENCOUNTER — Other Ambulatory Visit: Payer: Self-pay | Admitting: *Deleted

## 2015-08-22 MED ORDER — BECLOMETHASONE DIPROPIONATE 80 MCG/ACT IN AERS
1.0000 | INHALATION_SPRAY | Freq: Every day | RESPIRATORY_TRACT | Status: DC
Start: 2015-08-22 — End: 2015-09-25

## 2015-08-23 LAB — HM COLONOSCOPY

## 2015-09-14 ENCOUNTER — Encounter: Payer: Self-pay | Admitting: Internal Medicine

## 2015-09-18 ENCOUNTER — Encounter: Payer: Self-pay | Admitting: *Deleted

## 2015-09-24 IMAGING — CR DG CHEST 2V
2 series · 2 of 2 positions shown · non-contrast
Comparison: None.

CLINICAL DATA: Persistent cough.

EXAM:
CHEST  2 VIEW

[view not recorded (1 of 2)]
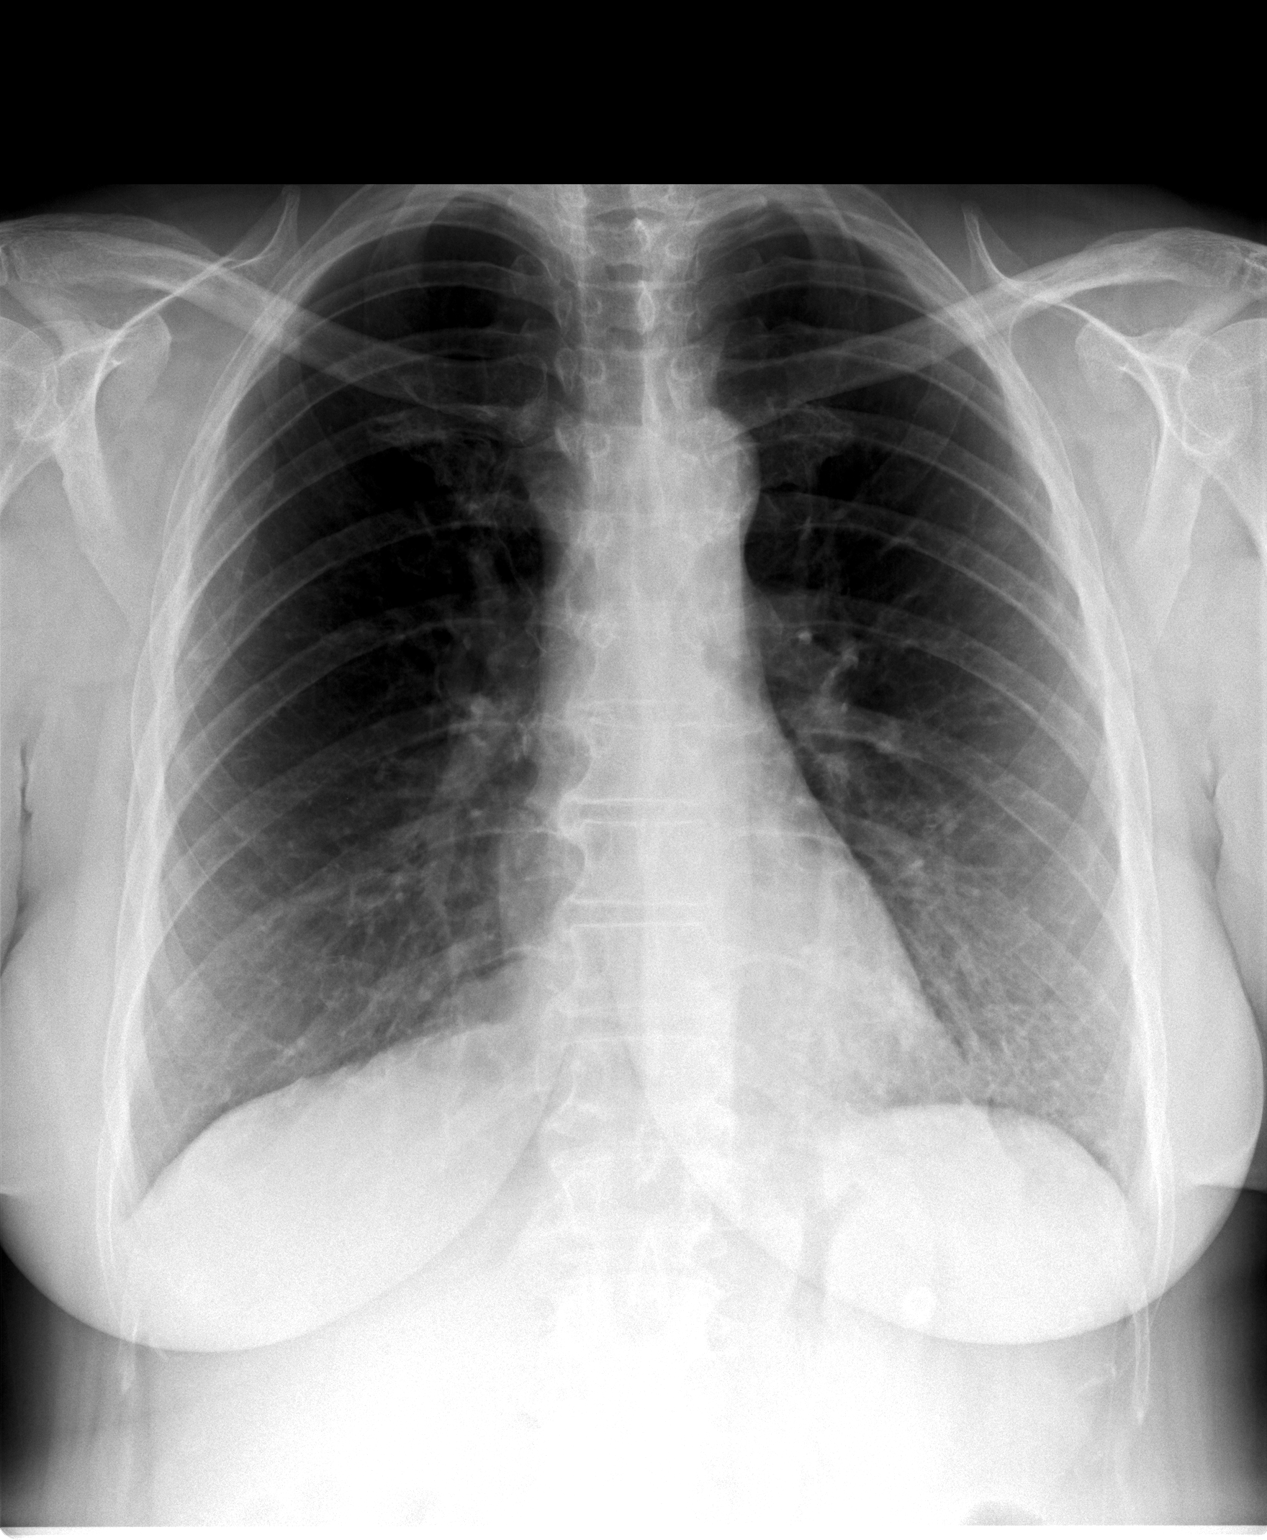

[view not recorded (2 of 2)]
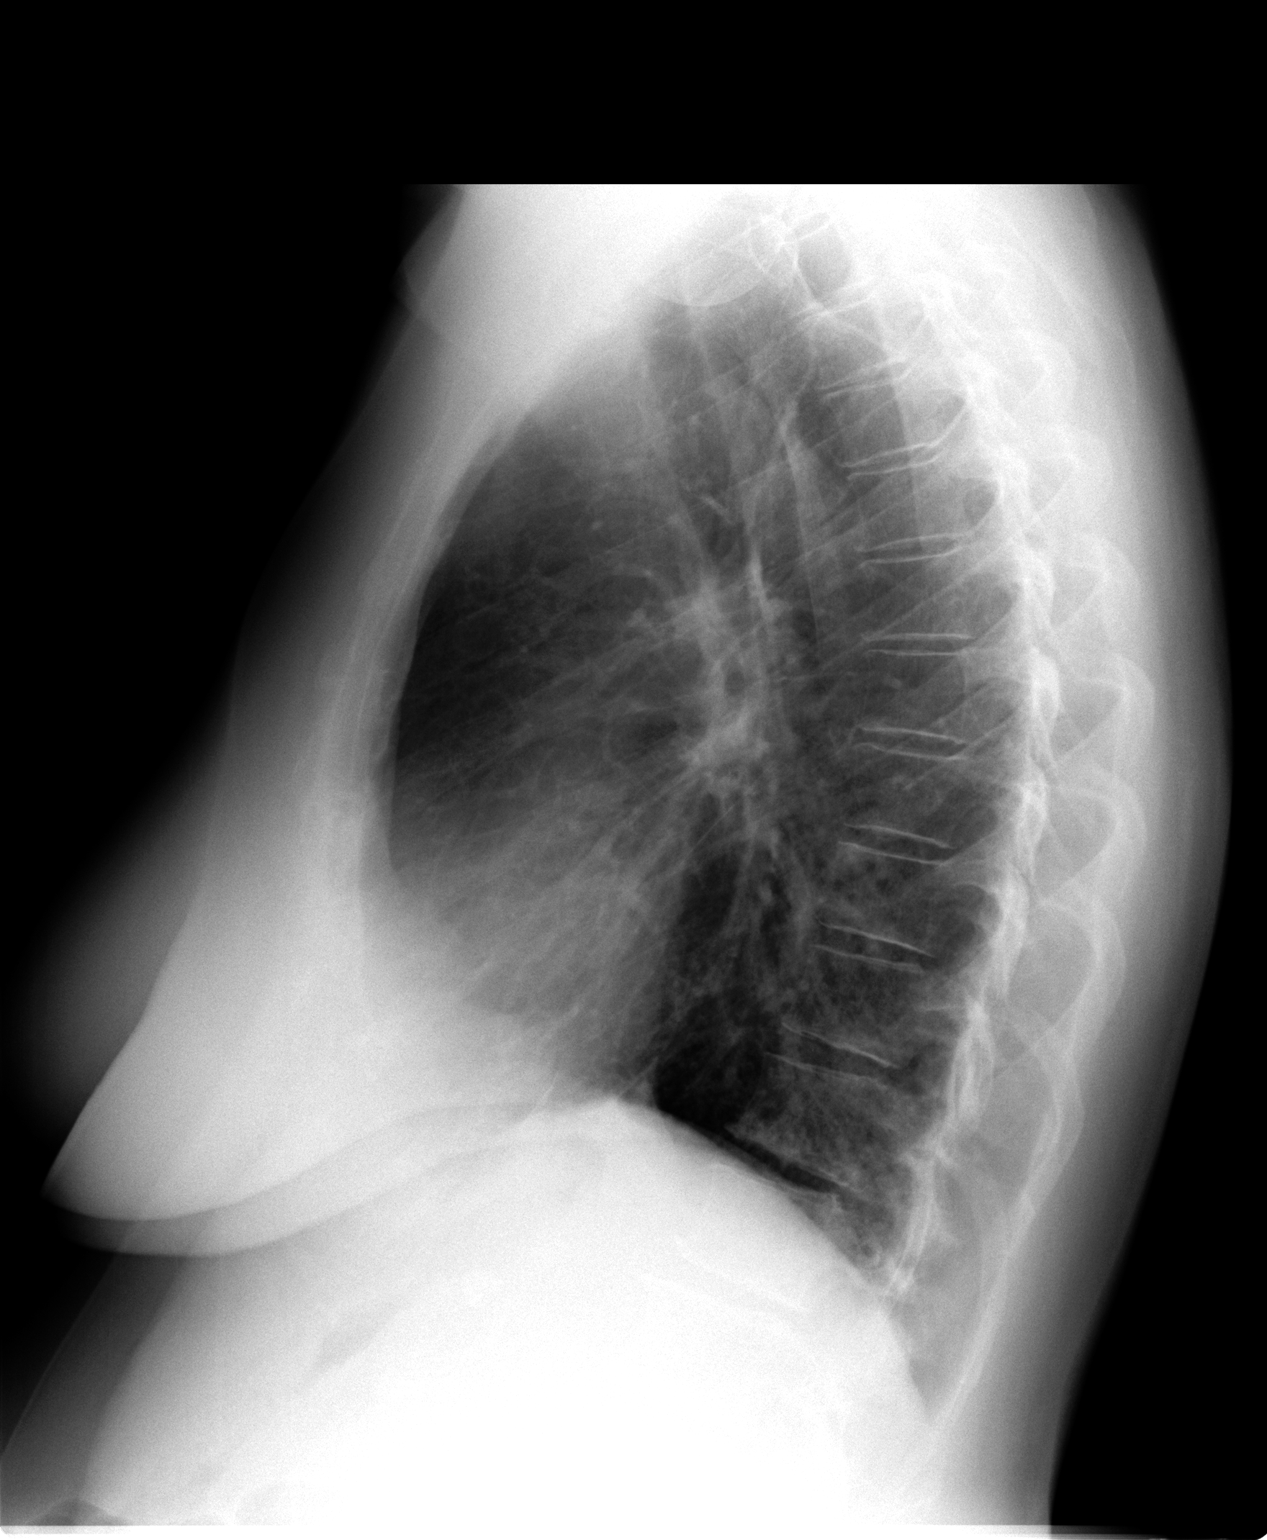

[2 of 2 positions shown; findings below may reference images not displayed]

FINDINGS: Mediastinum and hilar structures are normal. Increased interstitial
markings in the left lung base consistent with pneumonitis. Right
lung is clear. Heart size normal. No acute bony abnormality.
IMPRESSION: Increased interstitial markings left lung base consistent with
pneumonitis.

## 2015-09-25 ENCOUNTER — Other Ambulatory Visit: Payer: Self-pay | Admitting: *Deleted

## 2015-09-25 MED ORDER — FLUTICASONE FUROATE 100 MCG/ACT IN AEPB: 1.0000 | INHALATION_SPRAY | Freq: Every day | RESPIRATORY_TRACT | Status: DC

## 2015-10-06 ENCOUNTER — Other Ambulatory Visit: Payer: Self-pay | Admitting: Nurse Practitioner

## 2015-10-23 ENCOUNTER — Encounter: Payer: Self-pay | Admitting: *Deleted

## 2015-11-02 ENCOUNTER — Encounter: Payer: Self-pay | Admitting: Internal Medicine

## 2015-11-02 ENCOUNTER — Other Ambulatory Visit: Payer: Self-pay | Admitting: *Deleted

## 2015-11-02 MED ORDER — GLUCOSE BLOOD VI STRP: ORAL_STRIP | Status: DC

## 2015-12-25 ENCOUNTER — Encounter: Payer: Self-pay | Admitting: Internal Medicine

## 2015-12-25 ENCOUNTER — Ambulatory Visit (INDEPENDENT_AMBULATORY_CARE_PROVIDER_SITE_OTHER): Payer: 59 | Admitting: Internal Medicine

## 2015-12-25 VITALS — BP 142/94 | HR 76 | Temp 97.5°F | Ht 64.0 in | Wt 180.0 lb

## 2015-12-25 DIAGNOSIS — I10 Essential (primary) hypertension: Secondary | ICD-10-CM

## 2015-12-25 DIAGNOSIS — Z0000 Encounter for general adult medical examination without abnormal findings: Secondary | ICD-10-CM | POA: Diagnosis not present

## 2015-12-25 DIAGNOSIS — E119 Type 2 diabetes mellitus without complications: Secondary | ICD-10-CM | POA: Diagnosis not present

## 2015-12-25 DIAGNOSIS — E785 Hyperlipidemia, unspecified: Secondary | ICD-10-CM | POA: Diagnosis not present

## 2015-12-25 DIAGNOSIS — Z0001 Encounter for general adult medical examination with abnormal findings: Secondary | ICD-10-CM | POA: Insufficient documentation

## 2015-12-25 LAB — COMPREHENSIVE METABOLIC PANEL
ALBUMIN: 4.7 g/dL (ref 3.5–5.2)
ALT: 20 U/L (ref 0–35)
AST: 17 U/L (ref 0–37)
Alkaline Phosphatase: 58 U/L (ref 39–117)
BUN: 15 mg/dL (ref 6–23)
CALCIUM: 9.7 mg/dL (ref 8.4–10.5)
CHLORIDE: 103 meq/L (ref 96–112)
CO2: 30 mEq/L (ref 19–32)
Creatinine, Ser: 0.73 mg/dL (ref 0.40–1.20)
GFR: 84.77 mL/min (ref >60.00)
GLUCOSE: 118 mg/dL — AB (ref 70–99)
Potassium: 4.5 mEq/L (ref 3.5–5.1)
SODIUM: 140 meq/L (ref 135–145)
TOTAL PROTEIN: 7.3 g/dL (ref 6.0–8.3)
Total Bilirubin: 1 mg/dL (ref 0.2–1.2)

## 2015-12-25 LAB — LIPID PANEL
CHOL/HDL RATIO: 4
Cholesterol: 172 mg/dL (ref 0–200)
HDL: 43.8 mg/dL (ref >39.00)
LDL Cholesterol: 90 mg/dL (ref 0–99)
NONHDL: 128.01
Triglycerides: 192 mg/dL — ABNORMAL HIGH (ref 0.0–149.0)
VLDL: 38.4 mg/dL (ref 0.0–40.0)

## 2015-12-25 LAB — CBC
HCT: 43.6 % (ref 36.0–46.0)
Hemoglobin: 14.8 g/dL (ref 12.0–15.0)
MCHC: 33.9 g/dL (ref 30.0–36.0)
MCV: 86.5 fl (ref 78.0–100.0)
PLATELETS: 186 10*3/uL (ref 150.0–400.0)
RBC: 5.04 Mil/uL (ref 3.87–5.11)
RDW: 13.5 % (ref 11.5–15.5)
WBC: 8.4 10*3/uL (ref 4.0–10.5)

## 2015-12-25 LAB — MICROALBUMIN / CREATININE URINE RATIO
Creatinine,U: 31.8 mg/dL
MICROALB/CREAT RATIO: 2.2 mg/g (ref 0.0–30.0)
Microalb, Ur: <0.7 mg/dL (ref 0.0–1.9)

## 2015-12-25 LAB — HEMOGLOBIN A1C: Hgb A1c MFr Bld: 6.7 % — ABNORMAL HIGH (ref 4.6–6.5)

## 2015-12-25 NOTE — Assessment & Plan Note (Signed)
General medical exam normal today. Breast and pelvic exam deferred as completed by her OB. Mammogram UTD and reviewed. Colonoscopy UTD and reviewed. Immunizations UTD. Labs today including screen for Hep C. Encouraged healthy diet and exercise. Follow up 6 months and prn.

## 2015-12-25 NOTE — Patient Instructions (Signed)
Health Maintenance, Female Adopting a healthy lifestyle and getting preventive care can go a long way to promote health and wellness. Talk with your health care provider about what schedule of regular examinations is right for you. This is a good chance for you to check in with your provider about disease prevention and staying healthy. In between checkups, there are plenty of things you can do on your own. Experts have done a lot of research about which lifestyle changes and preventive measures are most likely to keep you healthy. Ask your health care provider for more information. WEIGHT AND DIET  Eat a healthy diet  Be sure to include plenty of vegetables, fruits, low-fat dairy products, and lean protein.  Do not eat a lot of foods high in solid fats, added sugars, or salt.  Get regular exercise. This is one of the most important things you can do for your health.  Most adults should exercise for at least 150 minutes each week. The exercise should increase your heart rate and make you sweat (moderate-intensity exercise).  Most adults should also do strengthening exercises at least twice a week. This is in addition to the moderate-intensity exercise.  Maintain a healthy weight  Body mass index (BMI) is a measurement that can be used to identify possible weight problems. It estimates body fat based on height and weight. Your health care provider can help determine your BMI and help you achieve or maintain a healthy weight.  For females 20 years of age and older:   A BMI below 18.5 is considered underweight.  A BMI of 18.5 to 24.9 is normal.  A BMI of 25 to 29.9 is considered overweight.  A BMI of 30 and above is considered obese.  Watch levels of cholesterol and blood lipids  You should start having your blood tested for lipids and cholesterol at 66 years of age, then have this test every 5 years.  You may need to have your cholesterol levels checked more often if:  Your lipid  or cholesterol levels are high.  You are older than 66 years of age.  You are at high risk for heart disease.  CANCER SCREENING   Lung Cancer  Lung cancer screening is recommended for adults 55-80 years old who are at high risk for lung cancer because of a history of smoking.  A yearly low-dose CT scan of the lungs is recommended for people who:  Currently smoke.  Have quit within the past 15 years.  Have at least a 30-pack-year history of smoking. A pack year is smoking an average of one pack of cigarettes a day for 1 year.  Yearly screening should continue until it has been 15 years since you quit.  Yearly screening should stop if you develop a health problem that would prevent you from having lung cancer treatment.  Breast Cancer  Practice breast self-awareness. This means understanding how your breasts normally appear and feel.  It also means doing regular breast self-exams. Let your health care provider know about any changes, no matter how small.  If you are in your 20s or 30s, you should have a clinical breast exam (CBE) by a health care provider every 1-3 years as part of a regular health exam.  If you are 40 or older, have a CBE every year. Also consider having a breast X-ray (mammogram) every year.  If you have a family history of breast cancer, talk to your health care provider about genetic screening.  If you   are at high risk for breast cancer, talk to your health care provider about having an MRI and a mammogram every year.  Breast cancer gene (BRCA) assessment is recommended for women who have family members with BRCA-related cancers. BRCA-related cancers include:  Breast.  Ovarian.  Tubal.  Peritoneal cancers.  Results of the assessment will determine the need for genetic counseling and BRCA1 and BRCA2 testing. Cervical Cancer Your health care provider may recommend that you be screened regularly for cancer of the pelvic organs (ovaries, uterus, and  vagina). This screening involves a pelvic examination, including checking for microscopic changes to the surface of your cervix (Pap test). You may be encouraged to have this screening done every 3 years, beginning at age 21.  For women ages 30-65, health care providers may recommend pelvic exams and Pap testing every 3 years, or they may recommend the Pap and pelvic exam, combined with testing for human papilloma virus (HPV), every 5 years. Some types of HPV increase your risk of cervical cancer. Testing for HPV may also be done on women of any age with unclear Pap test results.  Other health care providers may not recommend any screening for nonpregnant women who are considered low risk for pelvic cancer and who do not have symptoms. Ask your health care provider if a screening pelvic exam is right for you.  If you have had past treatment for cervical cancer or a condition that could lead to cancer, you need Pap tests and screening for cancer for at least 20 years after your treatment. If Pap tests have been discontinued, your risk factors (such as having a new sexual partner) need to be reassessed to determine if screening should resume. Some women have medical problems that increase the chance of getting cervical cancer. In these cases, your health care provider may recommend more frequent screening and Pap tests. Colorectal Cancer  This type of cancer can be detected and often prevented.  Routine colorectal cancer screening usually begins at 66 years of age and continues through 66 years of age.  Your health care provider may recommend screening at an earlier age if you have risk factors for colon cancer.  Your health care provider may also recommend using home test kits to check for hidden blood in the stool.  A small camera at the end of a tube can be used to examine your colon directly (sigmoidoscopy or colonoscopy). This is done to check for the earliest forms of colorectal  cancer.  Routine screening usually begins at age 50.  Direct examination of the colon should be repeated every 5-10 years through 66 years of age. However, you may need to be screened more often if early forms of precancerous polyps or small growths are found. Skin Cancer  Check your skin from head to toe regularly.  Tell your health care provider about any new moles or changes in moles, especially if there is a change in a mole's shape or color.  Also tell your health care provider if you have a mole that is larger than the size of a pencil eraser.  Always use sunscreen. Apply sunscreen liberally and repeatedly throughout the day.  Protect yourself by wearing long sleeves, pants, a wide-brimmed hat, and sunglasses whenever you are outside. HEART DISEASE, DIABETES, AND HIGH BLOOD PRESSURE   High blood pressure causes heart disease and increases the risk of stroke. High blood pressure is more likely to develop in:  People who have blood pressure in the high end   of the normal range (130-139/85-89 mm Hg).  People who are overweight or obese.  People who are African American.  If you are 38-23 years of age, have your blood pressure checked every 3-5 years. If you are 61 years of age or older, have your blood pressure checked every year. You should have your blood pressure measured twice--once when you are at a hospital or clinic, and once when you are not at a hospital or clinic. Record the average of the two measurements. To check your blood pressure when you are not at a hospital or clinic, you can use:  An automated blood pressure machine at a pharmacy.  A home blood pressure monitor.  If you are between 45 years and 39 years old, ask your health care provider if you should take aspirin to prevent strokes.  Have regular diabetes screenings. This involves taking a blood sample to check your fasting blood sugar level.  If you are at a normal weight and have a low risk for diabetes,  have this test once every three years after 66 years of age.  If you are overweight and have a high risk for diabetes, consider being tested at a younger age or more often. PREVENTING INFECTION  Hepatitis B  If you have a higher risk for hepatitis B, you should be screened for this virus. You are considered at high risk for hepatitis B if:  You were born in a country where hepatitis B is common. Ask your health care provider which countries are considered high risk.  Your parents were born in a high-risk country, and you have not been immunized against hepatitis B (hepatitis B vaccine).  You have HIV or AIDS.  You use needles to inject street drugs.  You live with someone who has hepatitis B.  You have had sex with someone who has hepatitis B.  You get hemodialysis treatment.  You take certain medicines for conditions, including cancer, organ transplantation, and autoimmune conditions. Hepatitis C  Blood testing is recommended for:  Everyone born from [birth year] through 1966.  Anyone with known risk factors for hepatitis C. Sexually transmitted infections (STIs)  You should be screened for sexually transmitted infections (STIs) including gonorrhea and chlamydia if:  You are sexually active and are younger than 66 years of age.  You are older than 66 years of age and your health care provider tells you that you are at risk for this type of infection.  Your sexual activity has changed since you were last screened and you are at an increased risk for chlamydia or gonorrhea. Ask your health care provider if you are at risk.  If you do not have HIV, but are at risk, it may be recommended that you take a prescription medicine daily to prevent HIV infection. This is called pre-exposure prophylaxis (PrEP). You are considered at risk if:  You are sexually active and do not regularly use condoms or know the HIV status of your partner(s).  You take drugs by injection.  You are sexually  active with a partner who has HIV. Talk with your health care provider about whether you are at high risk of being infected with HIV. If you choose to begin PrEP, you should first be tested for HIV. You should then be tested every 3 months for as long as you are taking PrEP.  PREGNANCY   If you are premenopausal and you may become pregnant, ask your health care provider about preconception counseling.  If you may  become pregnant, take 400 to 800 micrograms (mcg) of folic acid every day.  If you want to prevent pregnancy, talk to your health care provider about birth control (contraception). OSTEOPOROSIS AND MENOPAUSE   Osteoporosis is a disease in which the bones lose minerals and strength with aging. This can result in serious bone fractures. Your risk for osteoporosis can be identified using a bone density scan.  If you are 61 years of age or older, or if you are at risk for osteoporosis and fractures, ask your health care provider if you should be screened.  Ask your health care provider whether you should take a calcium or vitamin D supplement to lower your risk for osteoporosis.  Menopause may have certain physical symptoms and risks.  Hormone replacement therapy may reduce some of these symptoms and risks. Talk to your health care provider about whether hormone replacement therapy is right for you.  HOME CARE INSTRUCTIONS   Schedule regular health, dental, and eye exams.  Stay current with your immunizations.   Do not use any tobacco products including cigarettes, chewing tobacco, or electronic cigarettes.  If you are pregnant, do not drink alcohol.  If you are breastfeeding, limit how much and how often you drink alcohol.  Limit alcohol intake to no more than 1 drink per day for nonpregnant women. One drink equals 12 ounces of beer, 5 ounces of wine, or 1 ounces of hard liquor.  Do not use street drugs.  Do not share needles.  Ask your health care provider for help if  you need support or information about quitting drugs.  Tell your health care provider if you often feel depressed.  Tell your health care provider if you have ever been abused or do not feel safe at home.   This information is not intended to replace advice given to you by your health care provider. Make sure you discuss any questions you have with your health care provider.   Document Released: 02/17/2011 Document Revised: 08/25/2014 Document Reviewed: 07/06/2013 Elsevier Interactive Patient Education [employer].

## 2015-12-25 NOTE — Progress Notes (Signed)
Subjective:    Patient ID: Trenay Draxler, female    DOB: 1950/10/06, 65 y.o.   MRN: [id number]  HPI  65YO female presents for physical exam.  Feeling well. No concerns today.  Wt Readings from Last 3 Encounters:  12/25/15 180 lb (81.647 kg)  07/19/15 177 lb (80.287 kg)  06/06/15 174 lb 2 oz (78.983 kg)   BP Readings from Last 3 Encounters:  12/25/15 142/94  07/19/15 128/74  06/06/15 136/85    Past Medical History  Diagnosis Date  . Hyperlipidemia   . Diabetes mellitus   . Kidney stone   . Hypertension    Family History  Problem Relation Age of Onset  . Heart disease Mother   . Heart failure Mother   . Diabetes Father   . Lung cancer Father   . Diabetes Sister   . Diabetes Brother   . Cancer Maternal Grandmother     breast   Past Surgical History  Procedure Laterality Date  . Trigger finger release     Social History   Social History  . Marital Status: Married    Spouse Name: N/A  . Number of Children: N/A  . Years of Education: N/A   Social History Main Topics  . Smoking status: Former Smoker -- 0.25 packs/day for 20 years    Types: Cigarettes    Quit date: 05/09/1999  . Smokeless tobacco: Never Used  . Alcohol Use: No  . Drug Use: No  . Sexual Activity: Not Asked   Other Topics Concern  . None   Social History Narrative    Review of Systems  Constitutional: Negative for fever, chills, appetite change, fatigue and unexpected weight change.  HENT: Negative for congestion, hearing loss, postnasal drip, rhinorrhea, sinus pressure, sore throat, trouble swallowing and voice change.   Eyes: Negative for visual disturbance.  Respiratory: Negative for cough, chest tightness and shortness of breath.   Cardiovascular: Negative for chest pain and leg swelling.  Gastrointestinal: Negative for nausea, vomiting, abdominal pain, diarrhea and constipation.  Genitourinary: Negative for dysuria, urgency, frequency, vaginal bleeding, vaginal discharge,  vaginal pain and pelvic pain.  Musculoskeletal: Negative for myalgias and arthralgias.  Skin: Negative for color change and rash.  Neurological: Negative for dizziness, weakness and light-headedness.  Hematological: Negative for adenopathy. Does not bruise/bleed easily.  Psychiatric/Behavioral: Negative for suicidal ideas, sleep disturbance and dysphoric mood. The patient is not nervous/anxious.        Objective:    BP 142/94 mmHg  Pulse 76  Temp(Src) 97.5 F (36.4 C) (Oral)  Ht 5\' 4"  (1.626 m)  Wt 180 lb (81.647 kg)  BMI 30.88 kg/m2  SpO2 98% Physical Exam  Constitutional: She is oriented to person, place, and time. She appears well-developed and well-nourished. No distress.  HENT:  Head: Normocephalic and atraumatic.  Right Ear: External ear normal.  Left Ear: External ear normal.  Nose: Nose normal.  Mouth/Throat: Oropharynx is clear and moist. No oropharyngeal exudate.  Eyes: Conjunctivae and EOM are normal. Pupils are equal, round, and reactive to light. Right eye exhibits no discharge.  Neck: Normal range of motion. Neck supple. No thyromegaly present.  Cardiovascular: Normal rate, regular rhythm, normal heart sounds and intact distal pulses.  Exam reveals no gallop and no friction rub.   No murmur heard. Pulmonary/Chest: Effort normal. No respiratory distress. She has no wheezes. She has no rales.  Abdominal: Soft. Bowel sounds are normal. She exhibits no distension and no mass. There is no tenderness. There is  no rebound and no guarding.  Musculoskeletal: Normal range of motion. She exhibits no edema or tenderness.  Lymphadenopathy:    She has no cervical adenopathy.  Neurological: She is alert and oriented to person, place, and time. No cranial nerve deficit. Coordination normal.  Skin: Skin is warm and dry. No rash noted. She is not diaphoretic. No erythema. No pallor.  Psychiatric: She has a normal mood and affect. Her behavior is normal. Judgment and thought content  normal.          Assessment & Plan:   Problem List Items Addressed This Visit      Unprioritized   Diabetes type 2, controlled ([place])   Hyperlipidemia LDL goal <70   Hypertension   Routine general medical examination at a health care facility - Primary    General medical exam normal today. Breast and pelvic exam deferred as completed by her OB. Mammogram UTD and reviewed. Colonoscopy UTD and reviewed. Immunizations UTD. Labs today including screen for Hep C. Encouraged healthy diet and exercise. Follow up 6 months and prn.      Relevant Orders   Comprehensive metabolic panel   Hemoglobin A1c   Lipid panel   Microalbumin / creatinine urine ratio   CBC   Hepatitis C antibody       Return in about 6 months (around 06/26/2016) for Recheck.  Ronette Deter, MD [facility]

## 2015-12-26 LAB — HEPATITIS C ANTIBODY: HCV AB: NEGATIVE

## 2016-01-03 ENCOUNTER — Ambulatory Visit (INDEPENDENT_AMBULATORY_CARE_PROVIDER_SITE_OTHER): Payer: 59 | Admitting: Internal Medicine

## 2016-01-03 ENCOUNTER — Encounter: Payer: Self-pay | Admitting: Internal Medicine

## 2016-01-03 ENCOUNTER — Ambulatory Visit: Payer: Medicare PPO | Admitting: Internal Medicine

## 2016-01-03 VITALS — BP 128/80 | HR 86 | Ht 64.5 in | Wt 178.0 lb

## 2016-01-03 DIAGNOSIS — J4520 Mild intermittent asthma, uncomplicated: Secondary | ICD-10-CM | POA: Diagnosis not present

## 2016-01-03 NOTE — Assessment & Plan Note (Signed)
Showed much clinical improvement since starting Qvar Most of her symptoms started after her severe bronchitis episode in December 2015. PFT  testing and follow-up imaging have not revealed any organic cause or structural cause for her symptoms. I suspect after the bronchitis episode she may have developed bronchial hyperactivity and asthma-like symptoms which are manifesting as chronic cough, or cough variant asthma. This has been clinically reduced with the use of an inhaled steroid such as Qvar. Due to insurance reason, she is now switched from Qvar to [place].    Plan  -Arnuity 146mcg, 1 puff daily, -gargle and rinse after each use.  -Avoid any forms of smoke (first and smoking, secondhand smoking, E-cig, vaper, burning trash, etc.).

## 2016-01-03 NOTE — Patient Instructions (Signed)
Follow up with Dr. Stevenson Clinch in: 6 months - cont with Arnuity, gargle and rinse after each use - avoid allergens and strong odors.

## 2016-01-03 NOTE — Progress Notes (Signed)
MRN# [id number] Amber Oliver 12-28-1950   CC: Chief Complaint  Patient presents with  . Follow-up    cough better; no other complaints; no SOB, chest tightness      Brief History: Synopsis: 65 year old past medical history of hyperlipidemia diabetes presented to [facility] 09/2014 evaluation of chronic cough. Diagnosed bronchitis December 2015, chronic cough since then, usually has one cold per year treated with supportive care. PFTs with mild restriction TLC 67, RV 48, FEV1 82 MVC 87 FEV1/FVC 72, ERV 35, DLCO 114. Placed on Qvar trial with rapid improvement. Trial off lisinopril for 5 days, no improvement. Negative CT Chest. Doing well on ICS.  Events Since last clinic visit Seen for follow up of Asthma. Mild exacerbation during pollen season. Will start [place] soon, insurance will cover this and not Qvar. Mild intermittent cough, non productive, worst with odors (perfumes, etc).  No need for rescue inhaler in months.    Medication:   Current Outpatient Rx  Name  Route  Sig  Dispense  Refill  . aspirin 81 MG tablet   Oral   Take 1 tablet (81 mg total) by mouth daily.   30 tablet      . atorvastatin (LIPITOR) 20 MG tablet   Oral   Take 1 tablet (20 mg total) by mouth daily.   90 tablet   3   . cholecalciferol (VITAMIN D) 1000 UNITS tablet   Oral   Take 1,000 Units by mouth 2 (two) times daily.           Marland Kitchen CINNAMON PO   Oral   Take by mouth.         . fish oil-omega-3 fatty acids 1000 MG capsule   Oral   Take 2 g by mouth daily. MEGA RED BRAND          . Fluticasone Furoate (ARNUITY ELLIPTA) 100 MCG/ACT AEPB   Inhalation   Inhale 1 puff into the lungs daily.   30 each   5   . glucose blood (ACCU-CHEK AVIVA PLUS) test strip      Use as instructed to test blood sugar twice daily with Accu-Check monitor.   100 each   3   . metFORMIN (GLUCOPHAGE) 500 MG tablet      TAKE ONE TABLET BY MOUTH TWICE DAILY WITH MEALS   60 tablet   11   .  omeprazole (PRILOSEC) 40 MG capsule   Oral   Take 40 mg by mouth daily as needed.          Marland Kitchen POTASSIUM PO   Oral   Take 500 mg by mouth daily.            Review of Systems: Gen:  Denies  fever, sweats, chills HEENT: Denies blurred vision, double vision, ear pain, eye pain, hearing loss, nose bleeds, sore throat Cvc:  No dizziness, chest pain or heaviness Resp:   Admits [id number] non productive cough (intermittent) Gi: Denies swallowing difficulty, stomach pain, nausea or vomiting, diarrhea, constipation, bowel incontinence Gu:  Denies bladder incontinence, burning urine Ext:   No Joint pain, stiffness or swelling Skin: No skin rash, easy bruising or bleeding or hives Endoc:  No polyuria, polydipsia , polyphagia or weight change Other:  All other systems negative  Allergies:  Ciprofloxacin and Sulfa drugs cross reactors  Physical Examination:  VS: BP 128/80 mmHg  Pulse 86  Ht 5' 4.5" (1.638 m)  Wt 178 lb (80.74 kg)  BMI 30.09 kg/m2  SpO2 96%  General  Appearance: No distress  HEENT: PERRLA, no ptosis, no other lesions noticed Pulmonary:normal breath sounds., diaphragmatic excursion normal.No wheezing, No rales   Cardiovascular:  Normal S1,S2.  No m/r/g.     Abdomen:Exam: Benign, Soft, non-tender, No masses  Skin:   warm, no rashes, no ecchymosis  Extremities: normal, no cyanosis, clubbing, warm with normal capillary refill.       Assessment and Plan:64 yo Female seen in follow up for chronic cough after bronchitis 07/2014, now with rapid clinical improvement since being placed on Qvar.  Extrinsic asthma Showed much clinical improvement since starting Qvar Most of her symptoms started after her severe bronchitis episode in December 2015. PFT  testing and follow-up imaging have not revealed any organic cause or structural cause for her symptoms. I suspect after the bronchitis episode she may have developed bronchial hyperactivity and asthma-like symptoms which are  manifesting as chronic cough, or cough variant asthma. This has been clinically reduced with the use of an inhaled steroid such as Qvar. Due to insurance reason, she is now switched from Qvar to [place].    Plan  -Arnuity 175mcg, 1 puff daily, -gargle and rinse after each use.  -Avoid any forms of smoke (first and smoking, secondhand smoking, E-cig, vaper, burning trash, etc.).             Updated Medication List Outpatient Encounter Prescriptions as of 01/03/2016  Medication Sig  . aspirin 81 MG tablet Take 1 tablet (81 mg total) by mouth daily.  Marland Kitchen atorvastatin (LIPITOR) 20 MG tablet Take 1 tablet (20 mg total) by mouth daily.  . cholecalciferol (VITAMIN D) 1000 UNITS tablet Take 1,000 Units by mouth 2 (two) times daily.    Marland Kitchen CINNAMON PO Take by mouth.  . fish oil-omega-3 fatty acids 1000 MG capsule Take 2 g by mouth daily. MEGA RED BRAND   . Fluticasone Furoate (ARNUITY ELLIPTA) 100 MCG/ACT AEPB Inhale 1 puff into the lungs daily.  Marland Kitchen glucose blood (ACCU-CHEK AVIVA PLUS) test strip Use as instructed to test blood sugar twice daily with Accu-Check monitor.  . metFORMIN (GLUCOPHAGE) 500 MG tablet TAKE ONE TABLET BY MOUTH TWICE DAILY WITH MEALS  . omeprazole (PRILOSEC) 40 MG capsule Take 40 mg by mouth daily as needed.   Marland Kitchen POTASSIUM PO Take 500 mg by mouth daily.   No facility-administered encounter medications on file as of 01/03/2016.    Orders for this visit: No orders of the defined types were placed in this encounter.    Thank  you for the visitation and for allowing  [facility] to assist in the care of your patient. Our recommendations are noted above.  Please contact us if we can be of further service.  Vilinda Boehringer, MD  Pulmonary and Critical Care Office Number: [phone]

## 2016-01-23 ENCOUNTER — Encounter: Payer: Self-pay | Admitting: Internal Medicine

## 2016-01-25 ENCOUNTER — Other Ambulatory Visit: Payer: Self-pay | Admitting: *Deleted

## 2016-01-25 MED ORDER — BECLOMETHASONE DIPROPIONATE 80 MCG/ACT IN AERS: 1.0000 | INHALATION_SPRAY | Freq: Every day | RESPIRATORY_TRACT | Status: DC

## 2016-02-15 LAB — HM DIABETES EYE EXAM

## 2016-03-01 ENCOUNTER — Encounter: Payer: Self-pay | Admitting: Internal Medicine

## 2016-05-12 ENCOUNTER — Encounter: Payer: Self-pay | Admitting: Family Medicine

## 2016-05-21 ENCOUNTER — Ambulatory Visit (INDEPENDENT_AMBULATORY_CARE_PROVIDER_SITE_OTHER): Payer: Medicare Other | Admitting: Family Medicine

## 2016-05-21 ENCOUNTER — Encounter: Payer: Self-pay | Admitting: Family Medicine

## 2016-05-21 VITALS — BP 134/94 | HR 78 | Temp 98.0°F

## 2016-05-21 DIAGNOSIS — E119 Type 2 diabetes mellitus without complications: Secondary | ICD-10-CM | POA: Diagnosis not present

## 2016-05-21 DIAGNOSIS — E785 Hyperlipidemia, unspecified: Secondary | ICD-10-CM | POA: Diagnosis not present

## 2016-05-21 DIAGNOSIS — I10 Essential (primary) hypertension: Secondary | ICD-10-CM

## 2016-05-21 DIAGNOSIS — Z23 Encounter for immunization: Secondary | ICD-10-CM | POA: Diagnosis not present

## 2016-05-21 LAB — COMPREHENSIVE METABOLIC PANEL
ALT: 27 U/L (ref 0–35)
AST: 20 U/L (ref 0–37)
Albumin: 4.4 g/dL (ref 3.5–5.2)
Alkaline Phosphatase: 57 U/L (ref 39–117)
BILIRUBIN TOTAL: 1 mg/dL (ref 0.2–1.2)
BUN: 17 mg/dL (ref 6–23)
CALCIUM: 9.7 mg/dL (ref 8.4–10.5)
CO2: 29 meq/L (ref 19–32)
Chloride: 103 mEq/L (ref 96–112)
Creatinine, Ser: 0.8 mg/dL (ref 0.40–1.20)
GFR: 76.17 mL/min (ref >60.00)
GLUCOSE: 117 mg/dL — AB (ref 70–99)
Potassium: 4.7 mEq/L (ref 3.5–5.1)
Sodium: 139 mEq/L (ref 135–145)
Total Protein: 7.5 g/dL (ref 6.0–8.3)

## 2016-05-21 LAB — LIPID PANEL
CHOL/HDL RATIO: 4
Cholesterol: 166 mg/dL (ref 0–200)
HDL: 46.1 mg/dL (ref >39.00)
LDL Cholesterol: 89 mg/dL (ref 0–99)
NonHDL: 120.28
TRIGLYCERIDES: 154 mg/dL — AB (ref 0.0–149.0)
VLDL: 30.8 mg/dL (ref 0.0–40.0)

## 2016-05-21 LAB — HEMOGLOBIN A1C: Hgb A1c MFr Bld: 6.6 % — ABNORMAL HIGH (ref 4.6–6.5)

## 2016-05-21 NOTE — Assessment & Plan Note (Signed)
Appears to be well-controlled with CBGs. We'll check an A1c today. She'll continue metformin.

## 2016-05-21 NOTE — Patient Instructions (Signed)
Nice to see you. I would like you to check your blood pressure daily for the next week and then follow-up for nurse visit for blood pressure check. We will check some lab work and call you with the results.

## 2016-05-21 NOTE — Assessment & Plan Note (Signed)
Tolerating Lipitor. We'll check a lipid panel.

## 2016-05-21 NOTE — Assessment & Plan Note (Signed)
Slightly elevated today. She reports typically her blood pressure is slightly elevated when she gets nervous about having blood drawn. We will have her check it at home daily for the next week and then return for nurse visit in 1 week. If still elevated at that time would consider addition of medication.

## 2016-05-21 NOTE — Progress Notes (Signed)
  Amber Rumps, MD Phone: [phone]  Amber Oliver is a 65 y.o. female who presents today for f/u.  HYPERTENSION Disease Monitoring: Blood pressure range-reports is typically in the normal range Chest pain- no      Dyspnea- no Medications: Compliance- not taking medications.  Edema- no  DIABETES Disease Monitoring: Blood Sugar ranges-128 this morning Polyuria/phagia/dipsia- no      ophthalmology- see [place] yearly Medications: Compliance- taking metformin Hypoglycemic symptoms- rare, eats something and she improves  HYPERLIPIDEMIA Disease Monitoring: See symptoms for Hypertension Medications: Compliance- taking Lipitor, fish oil Right upper quadrant pain- no  Muscle aches- no   PMH: Former smoker   ROS see history of present illness  Objective  Physical Exam Vitals:   05/21/16 0950 05/21/16 1012  BP: (!) 158/82 (!) 134/94  Pulse: 78   Temp: 98 F (36.7 C)     BP Readings from Last 3 Encounters:  05/21/16 (!) 134/94  01/03/16 128/80  12/25/15 (!) 142/94   Wt Readings from Last 3 Encounters:  01/03/16 178 lb (80.7 kg)  12/25/15 180 lb (81.6 kg)  07/19/15 177 lb (80.3 kg)    Physical Exam  Constitutional: She is well-developed, well-nourished, and in no distress.  Cardiovascular: Normal rate, regular rhythm and normal heart sounds.   Pulmonary/Chest: Effort normal and breath sounds normal.  Musculoskeletal: She exhibits no edema.  Neurological: She is alert. Gait normal.  Skin: Skin is warm and dry.     Assessment/Plan: Please see individual problem list.  Hypertension Slightly elevated today. She reports typically her blood pressure is slightly elevated when she gets nervous about having blood drawn. We will have her check it at home daily for the next week and then return for nurse visit in 1 week. If still elevated at that time would consider addition of medication.  Diabetes type 2, controlled Appears to be well-controlled with CBGs. We'll check  an A1c today. She'll continue metformin.  Hyperlipidemia LDL goal <70 Tolerating Lipitor. We'll check a lipid panel.   Orders Placed This Encounter  Procedures  . Flu vaccine HIGH DOSE PF  . Comp Met (CMET)  . Lipid Profile  . HgB A1c    Amber Rumps, MD [place]

## 2016-05-28 ENCOUNTER — Ambulatory Visit (INDEPENDENT_AMBULATORY_CARE_PROVIDER_SITE_OTHER): Payer: Medicare Other

## 2016-05-28 DIAGNOSIS — I10 Essential (primary) hypertension: Secondary | ICD-10-CM

## 2016-05-28 NOTE — Progress Notes (Signed)
Patient came in for a BP check.  Was advised at Last OV on 10/4 to return for check in one week.  Checked vitals in bilateral upper extremities, see vitals section.  Patient has been checking BP at home, last readings were: 10/4                        141/74 at night 10/5  124/74 in AM 135/73 at night 10/6   120/73 in AM 129/72 at night 10/7   139/78 in AM 129/73 at night 10/8   133/79 in AM 144/76 at night 10/9    140/78 in AM 142/80 at night 10/10  139/75 in AM 132/76 at night 10/11   121/76 in AM   Please advise, Patient prefers a mychart with any changes. Thanks

## 2016-05-30 NOTE — Progress Notes (Signed)
Notified the patient. 

## 2016-05-30 NOTE — Progress Notes (Signed)
Blood pressure is well-controlled at home. She should continue to monitor. If greater than 140/90 persistently she should let us know. She should follow up in 3 months.  [name], M.D.

## 2016-06-12 ENCOUNTER — Encounter: Payer: Self-pay | Admitting: Family Medicine

## 2016-07-07 ENCOUNTER — Encounter: Payer: Self-pay | Admitting: Internal Medicine

## 2016-07-07 ENCOUNTER — Ambulatory Visit (INDEPENDENT_AMBULATORY_CARE_PROVIDER_SITE_OTHER): Payer: Medicare Other | Admitting: Internal Medicine

## 2016-07-07 VITALS — BP 130/88 | HR 98 | Wt 181.0 lb

## 2016-07-07 DIAGNOSIS — J4530 Mild persistent asthma, uncomplicated: Secondary | ICD-10-CM | POA: Diagnosis not present

## 2016-07-07 MED ORDER — BECLOMETHASONE DIPROPIONATE 80 MCG/ACT IN AERS: 1.0000 | INHALATION_SPRAY | Freq: Two times a day (BID) | RESPIRATORY_TRACT | 6 refills | Status: DC

## 2016-07-07 NOTE — Progress Notes (Signed)
MRN# [id number] Amber Oliver 02-20-1951   CC: Chief Complaint  Patient presents with  . Follow-up    no c/o SOB, chest tightness, cough      Brief History: Synopsis: 65 year old past medical history of hyperlipidemia diabetes presented to [facility] 09/2014 evaluation of chronic cough. Diagnosed bronchitis December 2015, chronic cough since then, usually has one cold per year treated with supportive care. PFTs with mild restriction TLC 67, RV 48, FEV1 82 MVC 87 FEV1/FVC 72, ERV 35, DLCO 114. Placed on Qvar trial with rapid improvement. Trial off lisinopril for 5 days, no improvement. Negative CT Chest. Doing well on ICS.  Events Since last clinic visit Seen for follow up of Asthma. Mild exacerbation during pollen season. Tolerated Qvar better than Arnuity Currently using Qvar 1 puff daily, most symptoms occur during changes of season (fall and spring).  Mild intermittent cough, non productive, worst with odors (perfumes, etc).  No need for rescue inhaler in months.    Medication:    Current Outpatient Prescriptions:  .  aspirin 81 MG tablet, Take 1 tablet (81 mg total) by mouth daily., Disp: 30 tablet, Rfl:  .  atorvastatin (LIPITOR) 20 MG tablet, Take 1 tablet (20 mg total) by mouth daily., Disp: 90 tablet, Rfl: 3 .  beclomethasone (QVAR) 80 MCG/ACT inhaler, Inhale 1 puff into the lungs daily., Disp: 1 Inhaler, Rfl: 6 .  cholecalciferol (VITAMIN D) 1000 UNITS tablet, Take 1,000 Units by mouth 2 (two) times daily.  , Disp: , Rfl:  .  CINNAMON PO, Take by mouth., Disp: , Rfl:  .  fish oil-omega-3 fatty acids 1000 MG capsule, Take 2 g by mouth daily. MEGA RED BRAND , Disp: , Rfl:  .  glucose blood (ACCU-CHEK AVIVA PLUS) test strip, Use as instructed to test blood sugar twice daily with Accu-Check monitor., Disp: 100 each, Rfl: 3 .  metFORMIN (GLUCOPHAGE) 500 MG tablet, TAKE ONE TABLET BY MOUTH TWICE DAILY WITH MEALS, Disp: 60 tablet, Rfl: 11 .  omeprazole (PRILOSEC) 40 MG  capsule, Take 40 mg by mouth daily as needed. , Disp: , Rfl:  .  POTASSIUM PO, Take 500 mg by mouth daily., Disp: , Rfl:     Review of Systems: Gen:  Denies  fever, sweats, chills HEENT: Denies blurred vision, double vision, ear pain, eye pain, hearing loss, nose bleeds, sore throat Cvc:  No dizziness, chest pain or heaviness Resp:   Admits [id number] non productive cough (intermittent), significantly  Gi: Denies swallowing difficulty, stomach pain, nausea or vomiting, diarrhea, constipation, bowel incontinence Gu:  Denies bladder incontinence, burning urine Ext:   No Joint pain, stiffness or swelling Skin: No skin rash, easy bruising or bleeding or hives Endoc:  No polyuria, polydipsia , polyphagia or weight change Other:  All other systems negative  Allergies:  Ciprofloxacin and Sulfa drugs cross reactors  Physical Examination:  VS: BP 130/88 (BP Location: Left Arm, Cuff Size: Normal)   Pulse 98   Wt 181 lb (82.1 kg)   SpO2 97%   BMI 30.59 kg/m   General Appearance: No distress  HEENT: PERRLA, no ptosis, no other lesions noticed Pulmonary:normal breath sounds., diaphragmatic excursion normal.No wheezing, No rales   Cardiovascular:  Normal S1,S2.  No m/r/g.     Abdomen:Exam: Benign, Soft, non-tender, No masses  Skin:   warm, no rashes, no ecchymosis  Extremities: normal, no cyanosis, clubbing, warm with normal capillary refill.       Assessment and Plan:65 yo Female seen in follow up for  chronic cough after bronchitis 07/2014, now with rapid clinical improvement since being placed on Qvar.  Extrinsic asthma Showed much clinical improvement since starting Qvar, now this is maintenance tx.  Most of her symptoms started after her severe bronchitis episode in December 2015. PFT  testing and follow-up imaging have not revealed any organic cause or structural cause for her symptoms. I suspect after the bronchitis episode she may have developed bronchial hyperactivity and asthma-like  symptoms which are manifesting as chronic cough, or cough variant asthma. This has been clinically reduced with the use of an inhaled steroid such as Qvar.   Plan  -Qvar 17mcg, 1 puff BID, may go down to puff 1 QD as step down tx.  -Avoid any forms of smoke (first and smoking, secondhand smoking, E-cig, vaper, burning trash, etc.).            Updated Medication List Outpatient Encounter Prescriptions as of 07/07/2016  Medication Sig  . aspirin 81 MG tablet Take 1 tablet (81 mg total) by mouth daily.  Marland Kitchen atorvastatin (LIPITOR) 20 MG tablet Take 1 tablet (20 mg total) by mouth daily.  . beclomethasone (QVAR) 80 MCG/ACT inhaler Inhale 1 puff into the lungs daily.  . cholecalciferol (VITAMIN D) 1000 UNITS tablet Take 1,000 Units by mouth 2 (two) times daily.    Marland Kitchen CINNAMON PO Take by mouth.  . fish oil-omega-3 fatty acids 1000 MG capsule Take 2 g by mouth daily. MEGA RED BRAND   . glucose blood (ACCU-CHEK AVIVA PLUS) test strip Use as instructed to test blood sugar twice daily with Accu-Check monitor.  . metFORMIN (GLUCOPHAGE) 500 MG tablet TAKE ONE TABLET BY MOUTH TWICE DAILY WITH MEALS  . omeprazole (PRILOSEC) 40 MG capsule Take 40 mg by mouth daily as needed.   Marland Kitchen POTASSIUM PO Take 500 mg by mouth daily.  . [DISCONTINUED] Fluticasone Furoate (ARNUITY ELLIPTA) 100 MCG/ACT AEPB Inhale 1 puff into the lungs daily. (Patient not taking: Reported on 05/21/2016)   No facility-administered encounter medications on file as of 07/07/2016.     Orders for this visit: No orders of the defined types were placed in this encounter.   Thank  you for the visitation and for allowing  [facility] to assist in the care of your patient. Our recommendations are noted above.  Please contact us if we can be of further service.  Vilinda Boehringer, MD  Pulmonary and Critical Care Office Number: [phone]

## 2016-07-07 NOTE — Patient Instructions (Signed)
Follow up with Dr. Alva Garnet in 6 months - cont with Qvar 1 puff BID - may use 1 puff daily as step down therapy - cont with diet and exercise.  - cont with allergy avoidance

## 2016-07-07 NOTE — Assessment & Plan Note (Signed)
Showed much clinical improvement since starting Qvar, now this is maintenance tx.  Most of her symptoms started after her severe bronchitis episode in December 2015. PFT  testing and follow-up imaging have not revealed any organic cause or structural cause for her symptoms. I suspect after the bronchitis episode she may have developed bronchial hyperactivity and asthma-like symptoms which are manifesting as chronic cough, or cough variant asthma. This has been clinically reduced with the use of an inhaled steroid such as Qvar.   Plan  -Qvar 85mcg, 1 puff BID, may go down to puff 1 QD as step down tx.  -Avoid any forms of smoke (first and smoking, secondhand smoking, E-cig, vaper, burning trash, etc.).

## 2016-07-15 ENCOUNTER — Ambulatory Visit (INDEPENDENT_AMBULATORY_CARE_PROVIDER_SITE_OTHER): Payer: Medicare Other | Admitting: Family Medicine

## 2016-07-15 ENCOUNTER — Encounter: Payer: Self-pay | Admitting: Family Medicine

## 2016-07-15 VITALS — BP 128/78 | HR 85 | Temp 97.9°F | Wt 180.8 lb

## 2016-07-15 DIAGNOSIS — M545 Low back pain, unspecified: Secondary | ICD-10-CM | POA: Insufficient documentation

## 2016-07-15 DIAGNOSIS — R109 Unspecified abdominal pain: Secondary | ICD-10-CM

## 2016-07-15 LAB — POCT URINALYSIS DIPSTICK
Bilirubin, UA: NEGATIVE
Glucose, UA: NEGATIVE
Ketones, UA: NEGATIVE
LEUKOCYTES UA: NEGATIVE
NITRITE UA: NEGATIVE
PH UA: 5
Protein, UA: NEGATIVE
RBC UA: NEGATIVE
Spec Grav, UA: <=1.005
UROBILINOGEN UA: 0.2

## 2016-07-15 MED ORDER — BACLOFEN 10 MG PO TABS: 5.0000 mg | ORAL_TABLET | Freq: Three times a day (TID) | ORAL | 0 refills | Status: DC

## 2016-07-15 NOTE — Patient Instructions (Signed)
Nice to see you. You likely have a muscle strain in your back. We are going to recheck your urine to evaluate for infection. We are going to to treat you with baclofen. You can also continue tramadol as needed. If you develop worsening pain, numbness, weakness, loss of bowel or bladder function, numbness between your legs, fevers, or any new or changing symptoms please seek medical attention immediately.

## 2016-07-15 NOTE — Assessment & Plan Note (Signed)
Given patient's exam and unremarkable workup in the ED I suspect her symptoms are related to muscular strain and spasm. We will recheck her urine today to ensure there is no infectious component. She will use heat and the tramadol that was previously prescribed to her through the ED for this. We will add on baclofen to treat the muscle spasms. She was warned that this could make her drowsy. She will continue to monitor. If not improving over the next several weeks she will let us know. If worsens significantly she will be evaluated. She is given return precautions.

## 2016-07-15 NOTE — Progress Notes (Signed)
Pre visit review using our clinic review tool, if applicable. No additional management support is needed unless otherwise documented below in the visit note. 

## 2016-07-15 NOTE — Progress Notes (Signed)
  [name], MD Phone: [phone]  Amber Oliver is a 65 y.o. female who presents today for follow-up.  Patient was seen in the emergency room for right-sided flank pain. She had CT scan that initially revealed no kidney stones. On review there is an addendum that possibly reveals tiny nonobstructing stones in her bilateral kidneys. They treated her for a UTI to see if she would improve given white blood cells on her UA. Notes pain has improved since being evaluated. She has been taking Keflex. She notes no urinary symptoms. She notes the discomfort is in her right low back. It feels as though it spasms. Possibly radiates around to her stomach. No abdominal pain. No radiation down her legs. She is passing some gas. Has been 2 days since her last bowel movement. No vomiting. No numbness, weakness, loss of bowel or bladder function, saddle anesthesia, or fevers.  PMH: Former smoker. Has a history of kidney stones.   ROS see history of present illness  Objective  Physical Exam Vitals:   07/15/16 1045  BP: 128/78  Pulse: 85  Temp: 97.9 F (36.6 C)    BP Readings from Last 3 Encounters:  07/15/16 128/78  07/07/16 130/88  05/21/16 (!) 134/94   Wt Readings from Last 3 Encounters:  07/15/16 180 lb 12.8 oz (82 kg)  07/07/16 181 lb (82.1 kg)  01/03/16 178 lb (80.7 kg)    Physical Exam  Constitutional: No distress.  Cardiovascular: Normal rate, regular rhythm and normal heart sounds.   Pulmonary/Chest: Effort normal and breath sounds normal.  Abdominal: Soft. Bowel sounds are normal. She exhibits no distension. There is no tenderness.  Musculoskeletal: She exhibits no edema.  No midline spine tenderness, no midline spine step-off, there is right lumbar paraspinous muscular tenderness and spasm noted  Neurological: She is alert. Gait normal.  5 out of 5 strength bilateral quads, hamstrings, plantar flexion, and dorsiflexion, sensation to light touch intact bilateral lower  extremities, 2+ patellar reflexes  Skin: Skin is warm. She is not diaphoretic.     Assessment/Plan: Please see individual problem list.  Low back pain Given patient's exam and unremarkable workup in the ED I suspect her symptoms are related to muscular strain and spasm. We will recheck her urine today to ensure there is no infectious component. She will use heat and the tramadol that was previously prescribed to her through the ED for this. We will add on baclofen to treat the muscle spasms. She was warned that this could make her drowsy. She will continue to monitor. If not improving over the next several weeks she will let us know. If worsens significantly she will be evaluated. She is given return precautions.   Orders Placed This Encounter  Procedures  . POCT Urinalysis Dipstick    Meds ordered this encounter  Medications  . baclofen (LIORESAL) 10 MG tablet    Sig: Take 0.5 tablets (5 mg total) by mouth 3 (three) times daily.    Dispense:  30 each    Refill:  0    [name], MD [place]

## 2016-07-18 ENCOUNTER — Other Ambulatory Visit: Payer: Self-pay | Admitting: Surgical

## 2016-07-18 ENCOUNTER — Encounter: Payer: Self-pay | Admitting: Family Medicine

## 2016-07-18 MED ORDER — ATORVASTATIN CALCIUM 20 MG PO TABS: 20.0000 mg | ORAL_TABLET | Freq: Every day | ORAL | 3 refills | Status: DC

## 2016-08-25 ENCOUNTER — Encounter: Payer: Self-pay | Admitting: Family Medicine

## 2016-08-26 ENCOUNTER — Ambulatory Visit: Payer: Medicare Other | Admitting: Family Medicine

## 2016-08-27 ENCOUNTER — Ambulatory Visit (INDEPENDENT_AMBULATORY_CARE_PROVIDER_SITE_OTHER): Payer: Medicare Other | Admitting: Family Medicine

## 2016-08-27 ENCOUNTER — Encounter: Payer: Self-pay | Admitting: Family Medicine

## 2016-08-27 VITALS — BP 148/96 | HR 82 | Temp 98.2°F | Ht 65.0 in | Wt 178.2 lb

## 2016-08-27 DIAGNOSIS — Z0001 Encounter for general adult medical examination with abnormal findings: Secondary | ICD-10-CM

## 2016-08-27 DIAGNOSIS — E119 Type 2 diabetes mellitus without complications: Secondary | ICD-10-CM | POA: Diagnosis not present

## 2016-08-27 DIAGNOSIS — Z130 Encounter for screening for diseases of the blood and blood-forming organs and certain disorders involving the immune mechanism: Secondary | ICD-10-CM

## 2016-08-27 DIAGNOSIS — Z13220 Encounter for screening for lipoid disorders: Secondary | ICD-10-CM

## 2016-08-27 DIAGNOSIS — Z1329 Encounter for screening for other suspected endocrine disorder: Secondary | ICD-10-CM | POA: Diagnosis not present

## 2016-08-27 NOTE — Progress Notes (Signed)
Pre visit review using our clinic review tool, if applicable. No additional management support is needed unless otherwise documented below in the visit note. 

## 2016-08-27 NOTE — Progress Notes (Signed)
[name], MD Phone: [phone]  Amber Oliver is a 66 y.o. female who presents today for physical exam.  Exercises by walking and going to the gym. Tries to eat healthfully. Tries to eat vegetables. Tries not eat many snacks or sweets. Does not fry anything. Has tried decreasing her soda intake. Mammogram up-to-date. Colonoscopy up-to-date. Vaccinations up-to-date. Hepatitis C testing done previously. No prior abnormal Pap smears. No vaginal bleeding. Quit smoking about 20 years ago. Smoked a third of a pack for 15 years. Rare alcohol use. No illicit drug use. Sees an ophthalmologist yearly. Dentist twice a year.  Blood pressure elevated today. Typically checks it at home and it is no higher than 142/80. Typically in the 120s over 80s at home.  Active Ambulatory Problems    Diagnosis Date Noted  . Diabetes type 2, controlled ([place]) 06/01/2012  . Hyperlipidemia LDL goal <70 06/01/2012  . Hypertension 03/31/2013  . Coronary atherosclerosis 10/05/2014  . Left foot pain 05/11/2015  . Medicare annual wellness visit, subsequent 06/06/2015  . Extrinsic asthma 07/19/2015  . Encounter for general adult medical examination with abnormal findings 12/25/2015  . Low back pain 07/15/2016   Resolved Ambulatory Problems    Diagnosis Date Noted  . Diabetes mellitus 05/09/2011  . Hyperlipidemia 05/09/2011  . Abdominal pain 10/28/2011  . Urinary tract infection 03/01/2012  . Weight gain 09/01/2012  . Cerumen impaction 09/01/2012  . Elevated BP 09/01/2012  . Right hip pain 12/22/2012  . Need for prophylactic vaccination with combined diphtheria-tetanus-pertussis (DTP) vaccine 12/22/2012  . Right foot pain 10/03/2013  . UTI (urinary tract infection) 12/26/2013  . Cerumen impaction 04/03/2014  . Cough 04/03/2014  . Acute bronchitis 08/02/2014   Past Medical History:  Diagnosis Date  . Diabetes mellitus   . Hyperlipidemia   . Hypertension   . Kidney stone     Family  History  Problem Relation Age of Onset  . Heart disease Mother   . Heart failure Mother   . Diabetes Father   . Lung cancer Father   . Diabetes Sister   . Diabetes Brother   . Cancer Maternal Grandmother     breast    Social History   Social History  . Marital status: Married    Spouse name: N/A  . Number of children: N/A  . Years of education: N/A   Occupational History  . Not on file.   Social History Main Topics  . Smoking status: Former Smoker    Packs/day: 0.25    Years: 20.00    Types: Cigarettes    Quit date: 05/09/1999  . Smokeless tobacco: Never Used  . Alcohol use No  . Drug use: No  . Sexual activity: Not on file   Other Topics Concern  . Not on file   Social History Narrative  . No narrative on file    ROS  General:  Negative for nexplained weight loss, fever Skin: Negative for new or changing mole, sore that won't heal HEENT: Negative for trouble hearing, trouble seeing, ringing in ears, mouth sores, hoarseness, change in voice, dysphagia. CV:  Negative for chest pain, dyspnea, edema, palpitations Resp: Negative for cough, dyspnea, hemoptysis GI: Negative for nausea, vomiting, diarrhea, constipation, abdominal pain, melena, hematochezia. GU: Negative for dysuria, incontinence, urinary hesitance, hematuria, vaginal or penile discharge, polyuria, sexual difficulty, lumps in testicle or breasts MSK: Negative for muscle cramps or aches, joint pain or swelling Neuro: Negative for headaches, weakness, numbness, dizziness, passing out/fainting Psych: Negative for depression, anxiety,  memory problems  Objective  Physical Exam Vitals:   08/27/16 1459 08/27/16 1519  BP: (!) 152/102 (!) 148/96  Pulse: 82   Temp: 98.2 F (36.8 C)     BP Readings from Last 3 Encounters:  08/27/16 (!) 148/96  07/15/16 128/78  07/07/16 130/88   Wt Readings from Last 3 Encounters:  08/27/16 178 lb 3.2 oz (80.8 kg)  07/15/16 180 lb 12.8 oz (82 kg)  07/07/16 181 lb  (82.1 kg)    Physical Exam  Constitutional: No distress.  HENT:  Head: Normocephalic and atraumatic.  Mouth/Throat: Oropharynx is clear and moist. No oropharyngeal exudate.  Eyes: Conjunctivae are normal. Pupils are equal, round, and reactive to light.  Neck: Neck supple.  Cardiovascular: Normal rate, regular rhythm and normal heart sounds.   Pulmonary/Chest: Effort normal and breath sounds normal.  Abdominal: Soft. Bowel sounds are normal. She exhibits no distension. There is no tenderness.  Musculoskeletal: She exhibits no edema.  Lymphadenopathy:    She has no cervical adenopathy.  Neurological: She is alert. Gait normal.  Skin: Skin is warm and dry. She is not diaphoretic.  Psychiatric: Mood and affect normal.   Patient does note chronic breast sensitivity while having her breast exams that has been going on since she was young. She tried multiple things with her previous physician's though nothing was of benefit. She notes no breast sensitivity at any other times. She has had benign mammograms over the years. Bilateral breasts with no masses, skin changes, or nipple inversion. Scattered small stable hemangiomas per patient noted on bilateral breasts. No masses in bilateral axillas.   Assessment/Plan:   Encounter for general adult medical examination with abnormal findings Physical exam completed today. Blood pressure is slightly elevated today. Somewhat improved on recheck. She reports normal blood pressures at home. We will have her check at home for the next week and then return to have her blood pressure monitor compared to our readings in the office. Pap smear and pelvic exam deferred given age and lack of prior abnormal Pap smear. Discussed diet and exercise. Mammogram and colonoscopy up-to-date. Vaccinations up-to-date. Lab work as outlined below.   Orders Placed This Encounter  Procedures  . Lipid Profile  . Comp Met (CMET)  . HgB A1c  . CBC  . TSH    [name], MD [place]

## 2016-08-27 NOTE — Assessment & Plan Note (Addendum)
Physical exam completed today. Blood pressure is slightly elevated today. Somewhat improved on recheck. She reports normal blood pressures at home. We will have her check at home for the next week and then return to have her blood pressure monitor compared to our readings in the office. Pap smear and pelvic exam deferred given age and lack of prior abnormal Pap smear. Discussed diet and exercise. Mammogram and colonoscopy up-to-date. Vaccinations up-to-date. Lab work as outlined below.

## 2016-08-27 NOTE — Patient Instructions (Signed)
Nice to see you. We'll check some lab work and call you with the results. We will have you return in 1 week for a nurse visit. Please bring your blood pressure monitor so that we can compare. Please check your blood pressures at home and if they're consistently greater than 140/90 at home please let us know.

## 2016-08-28 LAB — LIPID PANEL
CHOLESTEROL: 154 mg/dL (ref 0–200)
HDL: 44.1 mg/dL (ref >39.00)
LDL Cholesterol: 75 mg/dL (ref 0–99)
NONHDL: 109.78
TRIGLYCERIDES: 173 mg/dL — AB (ref 0.0–149.0)
Total CHOL/HDL Ratio: 3
VLDL: 34.6 mg/dL (ref 0.0–40.0)

## 2016-08-28 LAB — COMPREHENSIVE METABOLIC PANEL
ALBUMIN: 4.3 g/dL (ref 3.5–5.2)
ALK PHOS: 62 U/L (ref 39–117)
ALT: 25 U/L (ref 0–35)
AST: 16 U/L (ref 0–37)
BILIRUBIN TOTAL: 0.6 mg/dL (ref 0.2–1.2)
BUN: 13 mg/dL (ref 6–23)
CALCIUM: 10.3 mg/dL (ref 8.4–10.5)
CO2: 32 mEq/L (ref 19–32)
Chloride: 104 mEq/L (ref 96–112)
Creatinine, Ser: 0.82 mg/dL (ref 0.40–1.20)
GFR: 73.97 mL/min (ref >60.00)
Glucose, Bld: 83 mg/dL (ref 70–99)
POTASSIUM: 4.7 meq/L (ref 3.5–5.1)
Sodium: 143 mEq/L (ref 135–145)
TOTAL PROTEIN: 7.1 g/dL (ref 6.0–8.3)

## 2016-08-28 LAB — CBC
HCT: 42.3 % (ref 36.0–46.0)
Hemoglobin: 14.2 g/dL (ref 12.0–15.0)
MCHC: 33.7 g/dL (ref 30.0–36.0)
MCV: 86.8 fl (ref 78.0–100.0)
PLATELETS: 282 10*3/uL (ref 150.0–400.0)
RBC: 4.87 Mil/uL (ref 3.87–5.11)
RDW: 13.2 % (ref 11.5–15.5)
WBC: 8.7 10*3/uL (ref 4.0–10.5)

## 2016-08-28 LAB — TSH: TSH: 3.42 u[IU]/mL (ref 0.35–4.50)

## 2016-08-28 LAB — HEMOGLOBIN A1C: HEMOGLOBIN A1C: 6.6 % — AB (ref 4.6–6.5)

## 2016-09-09 ENCOUNTER — Ambulatory Visit (INDEPENDENT_AMBULATORY_CARE_PROVIDER_SITE_OTHER): Payer: Medicare Other

## 2016-09-09 VITALS — BP 118/78 | HR 77 | Resp 16

## 2016-09-09 DIAGNOSIS — I10 Essential (primary) hypertension: Secondary | ICD-10-CM

## 2016-09-09 NOTE — Progress Notes (Signed)
Blood pressure well controlled today. Continue to monitor. If continues to run greater than 140/90 consistently she should contact the office.  [name], M.D.

## 2016-09-09 NOTE — Progress Notes (Signed)
Patient comes in for 1 week blood pressure check .  She states she has been monitoring blood pressure at home.   It has been running 120/80 , 152/83 to 146/76.   She states she is under a lot of stress at home due to daughter is in the hospital and she has been taking care of granddaughter. Please advise.

## 2016-09-11 NOTE — Progress Notes (Signed)
Left message to call.

## 2016-09-12 NOTE — Progress Notes (Signed)
Patient advised of below and verbalized an understanding  

## 2016-09-19 ENCOUNTER — Other Ambulatory Visit: Payer: Self-pay | Admitting: Family Medicine

## 2016-09-19 MED ORDER — METFORMIN HCL 500 MG PO TABS: 500.0000 mg | ORAL_TABLET | Freq: Two times a day (BID) | ORAL | 11 refills | Status: DC

## 2016-09-19 NOTE — Telephone Encounter (Signed)
Last OV 08/27/16 last filled 10/08/15 60 [id number]

## 2016-09-19 NOTE — Telephone Encounter (Signed)
metFORMIN (GLUCOPHAGE) 500 MG tablet take one tablet by mouth twice daily with meals. Qty 180

## 2016-11-16 ENCOUNTER — Encounter: Payer: Self-pay | Admitting: Family Medicine

## 2016-11-26 ENCOUNTER — Ambulatory Visit: Payer: Medicare Other | Admitting: Family Medicine

## 2016-12-26 ENCOUNTER — Other Ambulatory Visit: Payer: Self-pay

## 2016-12-26 MED ORDER — GLUCOSE BLOOD VI STRP: ORAL_STRIP | 3 refills | Status: DC

## 2017-01-07 ENCOUNTER — Ambulatory Visit (INDEPENDENT_AMBULATORY_CARE_PROVIDER_SITE_OTHER): Payer: Medicare Other | Admitting: Internal Medicine

## 2017-01-07 ENCOUNTER — Encounter: Payer: Self-pay | Admitting: Internal Medicine

## 2017-01-07 VITALS — BP 138/82 | HR 88 | Ht 65.0 in | Wt 178.0 lb

## 2017-01-07 DIAGNOSIS — J4520 Mild intermittent asthma, uncomplicated: Secondary | ICD-10-CM

## 2017-01-07 MED ORDER — BECLOMETHASONE DIPROP HFA 80 MCG/ACT IN AERB: 1.0000 | INHALATION_SPRAY | Freq: Two times a day (BID) | RESPIRATORY_TRACT | 5 refills | Status: DC

## 2017-01-07 NOTE — Patient Instructions (Signed)

## 2017-01-07 NOTE — Progress Notes (Signed)
MRN# [id number] Amber Oliver Jun 20, 1951   CC: Chief Complaint  Patient presents with  . Advice Only    prev VM pt: had bronchitis about 1 month but is better and at baseline:       Brief History: Synopsis: 66 year old past medical history of hyperlipidemia diabetes presented to [facility] 09/2014 evaluation of chronic cough. Diagnosed bronchitis December 2015, chronic cough since then, usually has one cold per year treated with supportive care. PFTs with mild restriction TLC 67, RV 48, FEV1 82 MVC 87 FEV1/FVC 72, ERV 35, DLCO 114. Placed on Qvar trial with rapid improvement. Trial off lisinopril for 5 days, no improvement. Negative CT Chest. Doing well on ICS.  Events Since last clinic visit Seen for follow up of Asthma. Very well controlled with QVAR Had bronchitis from odor 1 week ago but resolved with time No need for rescue inhaler in1 year No signs of infection at this time  Medication:    Current Outpatient Prescriptions:  .  aspirin 81 MG tablet, Take 1 tablet (81 mg total) by mouth daily., Disp: 30 tablet, Rfl:  .  atorvastatin (LIPITOR) 20 MG tablet, Take 1 tablet (20 mg total) by mouth daily., Disp: 90 tablet, Rfl: 3 .  beclomethasone (QVAR) 80 MCG/ACT inhaler, Inhale 1 puff into the lungs 2 (two) times daily., Disp: 1 Inhaler, Rfl: 6 .  cholecalciferol (VITAMIN D) 1000 UNITS tablet, Take 1,000 Units by mouth 2 (two) times daily.  , Disp: , Rfl:  .  fish oil-omega-3 fatty acids 1000 MG capsule, Take 2 g by mouth daily. MEGA RED BRAND , Disp: , Rfl:  .  glucose blood (ACCU-CHEK AVIVA PLUS) test strip, Use as instructed to test blood sugar twice daily with Accu-Check monitor., Disp: 100 each, Rfl: 3 .  metFORMIN (GLUCOPHAGE) 500 MG tablet, Take 1 tablet (500 mg total) by mouth 2 (two) times daily with a meal., Disp: 60 tablet, Rfl: 11 .  omeprazole (PRILOSEC) 40 MG capsule, Take 40 mg by mouth daily as needed. , Disp: , Rfl:  .  POTASSIUM PO, Take 500 mg by mouth  daily., Disp: , Rfl:     Review of Systems: Gen:  Denies  fever, sweats, chills HEENT: Denies blurred vision, double vision, ear pain, eye pain, hearing loss, nose bleeds, sore throat Cvc:  No dizziness, chest pain or heaviness Resp:   Admits GL:OVFI non productive cough (intermittent), significantly  Other:  All other systems negative  Allergies:  Ciprofloxacin and Sulfa drugs cross reactors  Physical Examination:  VS: BP 138/82 (BP Location: Left Arm, Cuff Size: Normal)   Pulse 88   Ht 5\' 5"  (1.651 m)   Wt 178 lb (80.7 kg)   SpO2 96%   BMI 29.62 kg/m   General Appearance: No distress  HEENT: PERRLA, no ptosis, no other lesions noticed Pulmonary:normal breath sounds., diaphragmatic excursion normal.No wheezing, No rales   Cardiovascular:  Normal S1,S2.  No m/r/g.     Abdomen:Exam: Benign, Soft, non-tender, No masses  Skin:   warm, no rashes, no ecchymosis  Extremities: normal, no cyanosis, clubbing, warm with normal capillary refill.        A/P 66 yo Female seen in follow up for [place]  now with rapid clinical improvement since being placed on Qvar.   Extrinsic asthma Showed much clinical improvement since starting Qvar, now this is maintenance tx.   PFT  testing and follow-up imaging have not revealed any organic cause or structural cause for her symptoms. I  suspect after the bronchitis episode she may have developed bronchial hyperactivity and asthma-like symptoms which are manifesting as chronic cough, or cough variant asthma. This has been clinically reduced with the use of an inhaled steroid such as Qvar.   Plan  -Qvar 65mcg, 1 puff BID, may go down to puff 1 QD as step down tx.  -Avoid any forms of smoke (first and smoking, secondhand smoking, E-cig, vaper, burning trash, etc.).   Patient satisfied with Plan of action and management. All questions answered Follow up in 1 year as needed  Corrin Parker, M.D.  [facility]  Medical Director [place] Director [facility] Cardio-Pulmonary Department

## 2017-01-07 NOTE — Addendum Note (Signed)
Addended by: Renelda Mom on: 01/07/2017 09:47 AM   Modules accepted: Orders

## 2017-01-08 ENCOUNTER — Telehealth: Payer: Self-pay | Admitting: *Deleted

## 2017-01-08 NOTE — Telephone Encounter (Signed)
PA approved until 08/17/17. Pharmacy informed.

## 2017-01-08 NOTE — Telephone Encounter (Signed)
Initiated PA for Qvar Redihaler thru [place]. KeyNeita Garnet [id number]   [employer] Medicare Pt ID: [id number]  Will await response.

## 2017-02-24 ENCOUNTER — Ambulatory Visit (INDEPENDENT_AMBULATORY_CARE_PROVIDER_SITE_OTHER): Payer: Medicare Other | Admitting: Family Medicine

## 2017-02-24 ENCOUNTER — Encounter: Payer: Self-pay | Admitting: Family Medicine

## 2017-02-24 VITALS — BP 140/90 | HR 69 | Temp 98.0°F | Wt 180.4 lb

## 2017-02-24 DIAGNOSIS — M79672 Pain in left foot: Secondary | ICD-10-CM

## 2017-02-24 DIAGNOSIS — M79671 Pain in right foot: Secondary | ICD-10-CM | POA: Diagnosis not present

## 2017-02-24 DIAGNOSIS — E785 Hyperlipidemia, unspecified: Secondary | ICD-10-CM | POA: Diagnosis not present

## 2017-02-24 DIAGNOSIS — E119 Type 2 diabetes mellitus without complications: Secondary | ICD-10-CM

## 2017-02-24 DIAGNOSIS — I10 Essential (primary) hypertension: Secondary | ICD-10-CM

## 2017-02-24 DIAGNOSIS — Z13820 Encounter for screening for osteoporosis: Secondary | ICD-10-CM | POA: Diagnosis not present

## 2017-02-24 LAB — HM DIABETES EYE EXAM

## 2017-02-24 LAB — POCT GLYCOSYLATED HEMOGLOBIN (HGB A1C): Hemoglobin A1C: 6.2

## 2017-02-24 LAB — MICROALBUMIN / CREATININE URINE RATIO
CREATININE, U: 66.8 mg/dL
MICROALB UR: 0.7 mg/dL (ref 0.0–1.9)
Microalb Creat Ratio: 1 mg/g (ref 0.0–30.0)

## 2017-02-24 NOTE — Assessment & Plan Note (Signed)
Tolerating Lipitor 

## 2017-02-24 NOTE — Assessment & Plan Note (Signed)
Intermittent foot pain over the last couple months. Slight discomfort on palpation of the dorsum of her left foot. Could be arthritic changes. Doubt related to diabetes given description. Benign exam other than slight tenderness. Discussed ice and Tylenol. If not improving she can see podiatry.

## 2017-02-24 NOTE — Assessment & Plan Note (Signed)
At goal at home. She'll continue to monitor at home. No medications.

## 2017-02-24 NOTE — Patient Instructions (Signed)
Nice to see you. We will check an A1c today and get she set up for bone density scan. You can ice her feet and use Tylenol. If this does not continue to improve please let us know.

## 2017-02-24 NOTE — Assessment & Plan Note (Signed)
Well-controlled. She is interested in coming off metformin. We'll check an A1c today. If well-controlled she can stay off of metformin and will recheck in 3 months.

## 2017-02-24 NOTE — Progress Notes (Signed)
  [name], MD Phone: [phone]  Amber Oliver is a 66 y.o. female who presents today for follow-up.  Hypertension: Typically in the 120s over 70s at home. No medications. No chest pain, shortness breath, or edema.  Diabetes: Not checking sugars. Metformin once daily. No polyuria or polydipsia. No hypoglycemia. Sees ophthalmology today. She is walking lots for exercise and eating lots of salads with low-fat dressing. Avoids fried foods and sweet things.  Hyperlipidemia: Taking Lipitor. No right upper quadrant pain or myalgias.  She does note the tops her feet ache at times. Has been going on a couple months. Hurts more when she drives. No burning or tingling.  PMH: Former smoker. Quit greater than 15 years ago. Smoked less than a pack a day.   ROS see history of present illness  Objective  Physical Exam Vitals:   02/24/17 1043  BP: 140/90  Pulse: 69  Temp: 98 F (36.7 C)    BP Readings from Last 3 Encounters:  02/24/17 140/90  01/07/17 138/82  09/09/16 118/78   Wt Readings from Last 3 Encounters:  02/24/17 180 lb 6.4 oz (81.8 kg)  01/07/17 178 lb (80.7 kg)  08/27/16 178 lb 3.2 oz (80.8 kg)    Physical Exam  Constitutional: No distress.  Cardiovascular: Normal rate, regular rhythm and normal heart sounds.   Pulmonary/Chest: Effort normal and breath sounds normal.  Musculoskeletal: She exhibits no edema.  Mild tenderness of the dorsum of her left foot, negative squeeze test left foot, no tenderness of right foot, no swelling, no skin changes bilateral feet  Neurological: She is alert. Gait normal.  Skin: She is not diaphoretic.   Diabetic Foot Exam - Simple   Simple Foot Form Diabetic Foot exam was performed with the following findings:  Yes 02/24/2017 11:19 AM  Visual Inspection No deformities, no ulcerations, no other skin breakdown bilaterally:  Yes Sensation Testing Intact to touch and monofilament testing bilaterally:  Yes Pulse Check Posterior  Tibialis and Dorsalis pulse intact bilaterally:  Yes Comments     Assessment/Plan: Please see individual problem list.  Hypertension At goal at home. She'll continue to monitor at home. No medications.  Diabetes type 2, controlled Well-controlled. She is interested in coming off metformin. We'll check an A1c today. If well-controlled she can stay off of metformin and will recheck in 3 months.  Hyperlipidemia LDL goal <70 Tolerating Lipitor.  Foot pain Intermittent foot pain over the last couple months. Slight discomfort on palpation of the dorsum of her left foot. Could be arthritic changes. Doubt related to diabetes given description. Benign exam other than slight tenderness. Discussed ice and Tylenol. If not improving she can see podiatry.   Orders Placed This Encounter  Procedures  . DG Bone Density    Standing Status:   Future    Standing Expiration Date:   04/27/2018    Order Specific Question:   Reason for Exam (SYMPTOM  OR DIAGNOSIS REQUIRED)    Answer:   osteoporosis screening, postmenopausal    Order Specific Question:   Preferred imaging location?    Answer:   [facility]  . Urine Microalbumin w/creat. ratio  . POCT HgB A1C    [name], MD [place]

## 2017-02-25 ENCOUNTER — Encounter: Payer: Self-pay | Admitting: Family Medicine

## 2017-02-25 NOTE — Progress Notes (Signed)
o

## 2017-05-18 ENCOUNTER — Ambulatory Visit
Admission: RE | Admit: 2017-05-18 | Discharge: 2017-05-18 | Disposition: A | Discharge status: Home / Self Care | Payer: Medicare Other | Source: Ambulatory Visit | Attending: Family Medicine | Admitting: Family Medicine

## 2017-05-18 DIAGNOSIS — Z13820 Encounter for screening for osteoporosis: Secondary | ICD-10-CM

## 2017-05-18 DIAGNOSIS — Z780 Asymptomatic menopausal state: Secondary | ICD-10-CM | POA: Diagnosis not present

## 2017-05-21 ENCOUNTER — Encounter: Payer: Self-pay | Admitting: Family Medicine

## 2017-05-26 ENCOUNTER — Telehealth: Payer: Self-pay | Admitting: Radiology

## 2017-05-26 DIAGNOSIS — E785 Hyperlipidemia, unspecified: Secondary | ICD-10-CM

## 2017-05-26 DIAGNOSIS — E119 Type 2 diabetes mellitus without complications: Secondary | ICD-10-CM

## 2017-05-26 NOTE — Telephone Encounter (Signed)
Pt coming in for labs tomorrow, please place future orders. Thank you.  

## 2017-05-26 NOTE — Telephone Encounter (Signed)
Ordered

## 2017-05-26 NOTE — Addendum Note (Signed)
Addended by: Caryl Bis Nannie Starzyk G on: 05/26/2017 12:39 PM   Modules accepted: Orders

## 2017-05-27 ENCOUNTER — Encounter: Payer: Self-pay | Admitting: Family Medicine

## 2017-05-27 ENCOUNTER — Ambulatory Visit (INDEPENDENT_AMBULATORY_CARE_PROVIDER_SITE_OTHER): Payer: Medicare Other

## 2017-05-27 ENCOUNTER — Other Ambulatory Visit: Payer: Self-pay | Admitting: Radiology

## 2017-05-27 ENCOUNTER — Other Ambulatory Visit (INDEPENDENT_AMBULATORY_CARE_PROVIDER_SITE_OTHER): Payer: Medicare Other

## 2017-05-27 VITALS — BP 130/70 | HR 77 | Temp 98.1°F | Resp 14 | Ht 65.0 in | Wt 181.0 lb

## 2017-05-27 DIAGNOSIS — E7849 Other hyperlipidemia: Secondary | ICD-10-CM

## 2017-05-27 DIAGNOSIS — Z1389 Encounter for screening for other disorder: Secondary | ICD-10-CM

## 2017-05-27 DIAGNOSIS — E119 Type 2 diabetes mellitus without complications: Secondary | ICD-10-CM

## 2017-05-27 DIAGNOSIS — Z23 Encounter for immunization: Secondary | ICD-10-CM | POA: Diagnosis not present

## 2017-05-27 DIAGNOSIS — Z1331 Encounter for screening for depression: Secondary | ICD-10-CM

## 2017-05-27 DIAGNOSIS — Z0000 Encounter for general adult medical examination without abnormal findings: Secondary | ICD-10-CM | POA: Diagnosis not present

## 2017-05-27 LAB — COMPREHENSIVE METABOLIC PANEL
ALBUMIN: 4.4 g/dL (ref 3.5–5.2)
ALK PHOS: 57 U/L (ref 39–117)
ALT: 21 U/L (ref 0–35)
AST: 16 U/L (ref 0–37)
BILIRUBIN TOTAL: 0.7 mg/dL (ref 0.2–1.2)
BUN: 17 mg/dL (ref 6–23)
CO2: 29 mEq/L (ref 19–32)
Calcium: 9.8 mg/dL (ref 8.4–10.5)
Chloride: 103 mEq/L (ref 96–112)
Creatinine, Ser: 0.84 mg/dL (ref 0.40–1.20)
GFR: 71.78 mL/min (ref >60.00)
GLUCOSE: 135 mg/dL — AB (ref 70–99)
Potassium: 4.7 mEq/L (ref 3.5–5.1)
Sodium: 139 mEq/L (ref 135–145)
TOTAL PROTEIN: 7.5 g/dL (ref 6.0–8.3)

## 2017-05-27 LAB — HEMOGLOBIN A1C: HEMOGLOBIN A1C: 6.9 % — AB (ref 4.6–6.5)

## 2017-05-27 NOTE — Patient Instructions (Addendum)
  Amber Oliver , Thank you for taking time to come for your Medicare Wellness Visit. I appreciate your ongoing commitment to your health goals. Please review the following plan we discussed and let me know if I can assist you in the future.   Follow up with Dr. Caryl Bis as needed.    Bring a copy of your [place] and/or Living Will to be scanned into chart once completed.  Have a great day!  These are the goals we discussed: Goals    . Increase lean proteins          Low carb foods    . Increase physical activity          Walk for exercise       This is a list of the screening recommended for you and due dates:  Health Maintenance  Topic Date Due  . Hemoglobin A1C  08/27/2017  . Complete foot exam   02/24/2018  . Eye exam for diabetics  02/24/2018  . Urine Protein Check  02/24/2018  . Pneumonia vaccines (2 of 2 - PPSV23) 03/31/2018  . Mammogram  07/30/2018  . Tetanus Vaccine  12/23/2022  . Colon Cancer Screening  08/22/2025  . Flu Shot  Completed  . DEXA scan (bone density measurement)  Completed  .  Hepatitis C: One time screening is recommended by Center for Disease Control  (CDC) for  adults born from [birth year] through 1966.   Completed

## 2017-05-27 NOTE — Progress Notes (Signed)
Subjective:   Amber Oliver is a 66 y.o. female who presents for Medicare Annual (Subsequent) preventive examination.  Review of Systems:  No ROS.  Medicare Wellness Visit. Additional risk factors are reflected in the social history.  Cardiac Risk Factors include: advanced age (>53men, >37 women);diabetes mellitus;obesity (BMI >30kg/m2);hypertension     Objective:     Vitals: BP 130/70 (BP Location: Left Arm, Patient Position: Sitting, Cuff Size: Normal)   Pulse 77   Temp 98.1 F (36.7 C) (Oral)   Resp 14   Ht 5\' 5"  (1.651 m)   Wt 181 lb (82.1 kg)   SpO2 98%   BMI 30.12 kg/m   Body mass index is 30.12 kg/m.   Tobacco History  Smoking Status  . Former Smoker  . Packs/day: 0.25  . Years: 20.00  . Types: Cigarettes  . Quit date: 05/09/1999  Smokeless Tobacco  . Never Used     Counseling given: Not Answered   Past Medical History:  Diagnosis Date  . Diabetes mellitus   . Hyperlipidemia   . Hypertension   . Kidney stone    Past Surgical History:  Procedure Laterality Date  . TRIGGER FINGER RELEASE     Family History  Problem Relation Age of Onset  . Heart disease Mother   . Heart failure Mother   . Diabetes Father   . Lung cancer Father   . Diabetes Sister   . Diabetes Brother   . Cancer Maternal Grandmother        breast   History  Sexual Activity  . Sexual activity: No    Outpatient Encounter Prescriptions as of 05/27/2017  Medication Sig  . aspirin 81 MG tablet Take 1 tablet (81 mg total) by mouth daily.  Marland Kitchen atorvastatin (LIPITOR) 20 MG tablet Take 1 tablet (20 mg total) by mouth daily.  . Beclomethasone Diprop HFA (QVAR REDIHALER) 80 MCG/ACT AERB Inhale 1 puff into the lungs 2 (two) times daily.  . cholecalciferol (VITAMIN D) 1000 UNITS tablet Take 1,000 Units by mouth 2 (two) times daily.    . fish oil-omega-3 fatty acids 1000 MG capsule Take 2 g by mouth daily. MEGA RED BRAND   . glucose blood (ACCU-CHEK AVIVA PLUS) test strip Use as  instructed to test blood sugar twice daily with Accu-Check monitor.  Marland Kitchen omeprazole (PRILOSEC) 40 MG capsule Take 40 mg by mouth daily as needed.   Marland Kitchen POTASSIUM PO Take 500 mg by mouth daily.  . metFORMIN (GLUCOPHAGE) 500 MG tablet Take 1 tablet (500 mg total) by mouth 2 (two) times daily with a meal. (Patient not taking: Reported on 05/27/2017)   No facility-administered encounter medications on file as of 05/27/2017.     Activities of Daily Living In your present state of health, do you have any difficulty performing the following activities: 05/27/2017  Hearing? N  Vision? N  Difficulty concentrating or making decisions? N  Walking or climbing stairs? N  Dressing or bathing? N  Doing errands, shopping? N  Preparing Food and eating ? N  Using the Toilet? N  In the past six months, have you accidently leaked urine? N  Do you have problems with loss of bowel control? N  Managing your Medications? N  Managing your Finances? N  Housekeeping or managing your Housekeeping? N  Some recent data might be hidden    Patient Care Team: Leone Haven, MD as PCP - General (Family Medicine)    Assessment:    This is a routine  wellness examination for Amber Oliver. The goal of the wellness visit is to assist the patient how to close the gaps in care and create a preventative care plan for the patient.   The roster of all physicians providing medical care to patient is listed in the Snapshot section of the chart.  Taking calcium VIT D as appropriate/Osteoporosis risk reviewed.    Safety issues reviewed; Smoke and carbon monoxide detectors in the home. No firearms in the home.  Wears seatbelts when driving or riding with others. Patient does wear sunscreen or protective clothing when in direct sunlight. No violence in the home.  Depression- PHQ 2 &9 complete.  No signs/symptoms or verbal communication regarding little pleasure in doing things, feeling down, depressed or hopeless. No changes  in sleeping, energy, eating, concentrating.  No thoughts of self harm or harm towards others.  Time spent on this topic is 8 minutes.   Patient is alert, normal appearance, oriented to person/place/and time.  Correctly identified the president of the [place], recall of 3/3 words, and performing simple calculations. Displays appropriate judgement and can read correct time from watch face.   No new identified risk were noted.  No failures at ADL's or IADL's.    BMI- discussed the importance of a healthy diet, water intake and the benefits of aerobic exercise. Educational material provided.   24 hour diet recall: Breakfast: cereal Lunch: none Dinner: broiled pork chops, black eyed peas, roasted mashed potatoes  Daily fluid intake: 0 cups of caffeine, 4-6 cups of water  Dental- every 6 months. Dr. Leanor Kail.  Eye- Visual acuity not assessed per patient preference since they have regular follow up with the ophthalmologist.  Wears corrective lenses.  Sleep patterns- Sleeps 6-8 hours at night.  Wakes feeling rested.  High dose influenza administered L deltoid, tolerated well. Educational material provided.  Diabetes- followed by PCP. Today's BS 125.  Holding Metformin for 3 months, PCP aware.  Labs completed today.  Health maintenance gaps- closed.  Patient Concerns: None at this time. Follow up with PCP as needed.  Exercise Activities and Dietary recommendations Current Exercise Habits: Home exercise routine, Type of exercise: walking, Time (Minutes): 20, Frequency (Times/Week): 4, Weekly Exercise (Minutes/Week): 80, Intensity: Mild  Goals    . Increase lean proteins          Low carb foods    . Increase physical activity          Walk for exercise      Fall Risk Fall Risk  05/27/2017 02/24/2017 08/27/2016 06/06/2015 03/31/2013  Falls in the past year? No No No No No   Depression Screen PHQ 2/9 Scores 05/27/2017 02/24/2017 02/24/2017 06/06/2015  PHQ - 2 Score 0 0 0 0  PHQ- 9  Score 0 1 - -     Cognitive Function MMSE - Mini Mental State Exam 05/27/2017  Orientation to time 5  Orientation to Place 5  Registration 3  Attention/ Calculation 5  Recall 3  Language- name 2 objects 2  Language- repeat 1  Language- follow 3 step command 3  Language- read & follow direction 1  Write a sentence 1  Copy design 1  Total score 30        Immunization History  Administered Date(s) Administered  . Influenza, High Dose Seasonal PF 05/21/2016, 05/27/2017  . Influenza,inj,Quad PF,6+ Mos 05/11/2015  . Pneumococcal Conjugate-13 05/11/2015  . Pneumococcal Polysaccharide-23 03/31/2013  . Tdap 12/22/2012  . Zoster 03/18/2013   Screening Tests Health Maintenance  Topic Date Due  . HEMOGLOBIN A1C  08/27/2017  . FOOT EXAM  02/24/2018  . OPHTHALMOLOGY EXAM  02/24/2018  . URINE MICROALBUMIN  02/24/2018  . PNA vac Low Risk Adult (2 of 2 - PPSV23) 03/31/2018  . MAMMOGRAM  07/30/2018  . TETANUS/TDAP  12/23/2022  . COLONOSCOPY  08/22/2025  . INFLUENZA VACCINE  Completed  . DEXA SCAN  Completed  . Hepatitis C Screening  Completed      Plan:    End of life planning; Advanced aging; Advanced directives discussed.  No HCPOA/Living Will.  Additional information provided to help them start the conversation with family.  Copy of HCPOA/Living Will requested upon completion. Time spent on this topic is 25 minutes.  I have personally reviewed and noted the following in the patient's chart:   . Medical and social history . Use of alcohol, tobacco or illicit drugs  . Current medications and supplements . Functional ability and status . Nutritional status . Physical activity . Advanced directives . List of other physicians . Hospitalizations, surgeries, and ER visits in previous 12 months . Vitals . Screenings to include cognitive, depression, and falls . Referrals and appointments  In addition, I have reviewed and discussed with patient certain preventive protocols,  quality metrics, and best practice recommendations. A written personalized care plan for preventive services as well as general preventive health recommendations were provided to patient.     Varney Biles, LPN  [id number]

## 2017-07-23 ENCOUNTER — Other Ambulatory Visit: Payer: Self-pay | Admitting: *Deleted

## 2017-07-23 ENCOUNTER — Encounter: Payer: Self-pay | Admitting: Family Medicine

## 2017-07-23 MED ORDER — ATORVASTATIN CALCIUM 20 MG PO TABS: 20.0000 mg | ORAL_TABLET | Freq: Every day | ORAL | 0 refills | Status: DC

## 2017-08-26 ENCOUNTER — Telehealth: Payer: Self-pay | Admitting: *Deleted

## 2017-08-26 NOTE — Telephone Encounter (Signed)
PA initiated for Qvar via [employer] pending decision. [id number]

## 2017-08-26 NOTE — Telephone Encounter (Signed)
PA approved for qvar through 08/17/18 PA # [id number]. Nothing further needed.

## 2017-09-01 ENCOUNTER — Encounter: Payer: Self-pay | Admitting: Family Medicine

## 2017-09-01 ENCOUNTER — Ambulatory Visit: Payer: Medicare Other | Admitting: Family Medicine

## 2017-09-01 ENCOUNTER — Other Ambulatory Visit: Payer: Self-pay

## 2017-09-01 VITALS — BP 130/84 | HR 74 | Temp 97.9°F | Wt 182.6 lb

## 2017-09-01 DIAGNOSIS — J4530 Mild persistent asthma, uncomplicated: Secondary | ICD-10-CM | POA: Diagnosis not present

## 2017-09-01 DIAGNOSIS — E785 Hyperlipidemia, unspecified: Secondary | ICD-10-CM

## 2017-09-01 DIAGNOSIS — E119 Type 2 diabetes mellitus without complications: Secondary | ICD-10-CM | POA: Diagnosis not present

## 2017-09-01 LAB — LIPID PANEL
Cholesterol: 156 mg/dL (ref 0–200)
HDL: 41.3 mg/dL (ref >39.00)
LDL CALC: 82 mg/dL (ref 0–99)
NONHDL: 114.63
Total CHOL/HDL Ratio: 4
Triglycerides: 163 mg/dL — ABNORMAL HIGH (ref 0.0–149.0)
VLDL: 32.6 mg/dL (ref 0.0–40.0)

## 2017-09-01 LAB — COMPREHENSIVE METABOLIC PANEL
ALT: 22 U/L (ref 0–35)
AST: 15 U/L (ref 0–37)
Albumin: 4.4 g/dL (ref 3.5–5.2)
Alkaline Phosphatase: 58 U/L (ref 39–117)
BUN: 14 mg/dL (ref 6–23)
CO2: 29 meq/L (ref 19–32)
Calcium: 10.2 mg/dL (ref 8.4–10.5)
Chloride: 104 mEq/L (ref 96–112)
Creatinine, Ser: 0.76 mg/dL (ref 0.40–1.20)
GFR: 80.5 mL/min (ref >60.00)
GLUCOSE: 120 mg/dL — AB (ref 70–99)
Potassium: 4.8 mEq/L (ref 3.5–5.1)
SODIUM: 140 meq/L (ref 135–145)
Total Bilirubin: 0.9 mg/dL (ref 0.2–1.2)
Total Protein: 7.5 g/dL (ref 6.0–8.3)

## 2017-09-01 LAB — HEMOGLOBIN A1C: HEMOGLOBIN A1C: 7 % — AB (ref 4.6–6.5)

## 2017-09-01 NOTE — Patient Instructions (Signed)
Nice to see you. We will check lab work today and contact you with the results. 

## 2017-09-01 NOTE — Assessment & Plan Note (Signed)
Patient is back on metformin.  Check A1c.

## 2017-09-01 NOTE — Assessment & Plan Note (Signed)
Well-controlled.  Continue Qvar.  She will continue to follow with pulmonology.

## 2017-09-01 NOTE — Assessment & Plan Note (Signed)
-

## 2017-09-01 NOTE — Progress Notes (Signed)
  [name], MD Phone: [phone]  Amber Oliver is a 67 y.o. female who presents today for follow-up.  Diabetes: Typically less than 140 fasting.  Excursions up to 200 after meals.  Taking metformin.  No polyuria or polydipsia.  Rare hypoglycemia where she will feel sluggish and then drink orange juice with improvement.  Hyperlipidemia: Taking Lipitor and fish oil.  No chest pain, right upper quadrant pain, or myalgias.  Asthma: Currently taking Qvar 1 time a day at the advice of her pulmonologist.  She occasionally coughs when she is around a smell that bothers her such as cologne.  No other significant coughing.  No wheezing.  No shortness of breath.  Social History   Tobacco Use  Smoking Status Former Smoker  . Packs/day: 0.25  . Years: 20.00  . Pack years: 5.00  . Types: Cigarettes  . Last attempt to quit: 05/09/1999  . Years since quitting: 18.3  Smokeless Tobacco Never Used     ROS see history of present illness  Objective  Physical Exam Vitals:   09/01/17 1019  BP: 130/84  Pulse: 74  Temp: 97.9 F (36.6 C)  SpO2: 96%    BP Readings from Last 3 Encounters:  09/01/17 130/84  05/27/17 130/70  02/24/17 140/90   Wt Readings from Last 3 Encounters:  09/01/17 182 lb 9.6 oz (82.8 kg)  05/27/17 181 lb (82.1 kg)  02/24/17 180 lb 6.4 oz (81.8 kg)    Physical Exam  Constitutional: No distress.  Cardiovascular: Normal rate, regular rhythm and normal heart sounds.  Pulmonary/Chest: Effort normal and breath sounds normal.  Musculoskeletal: She exhibits no edema.  Neurological: She is alert. Gait normal.  Skin: Skin is warm and dry. She is not diaphoretic.     Assessment/Plan: Please see individual problem list.  Diabetes type 2, controlled Patient is back on metformin.  Check A1c.  Hyperlipidemia LDL goal <70 Continue Lipitor.  Check lipid panel.  Extrinsic asthma Well-controlled.  Continue Qvar.  She will continue to follow with  pulmonology.   Orders Placed This Encounter  Procedures  . Lipid panel  . HgB A1c  . Comp Met (CMET)    No orders of the defined types were placed in this encounter.    [name], MD [place]

## 2017-09-03 ENCOUNTER — Encounter: Payer: Self-pay | Admitting: Family Medicine

## 2017-09-03 LAB — HM MAMMOGRAPHY

## 2017-09-05 ENCOUNTER — Other Ambulatory Visit: Payer: Self-pay | Admitting: Family Medicine

## 2017-09-05 DIAGNOSIS — E785 Hyperlipidemia, unspecified: Secondary | ICD-10-CM

## 2017-09-05 MED ORDER — ATORVASTATIN CALCIUM 40 MG PO TABS: 40.0000 mg | ORAL_TABLET | Freq: Every day | ORAL | 3 refills | Status: DC

## 2017-09-05 MED ORDER — METFORMIN HCL 500 MG PO TABS: 1000.0000 mg | ORAL_TABLET | Freq: Two times a day (BID) | ORAL | 3 refills | Status: DC

## 2017-09-07 ENCOUNTER — Encounter: Payer: Self-pay | Admitting: Family Medicine

## 2017-09-08 ENCOUNTER — Encounter: Payer: Self-pay | Admitting: Family Medicine

## 2017-10-05 ENCOUNTER — Other Ambulatory Visit: Payer: Self-pay | Admitting: Family Medicine

## 2017-10-05 ENCOUNTER — Other Ambulatory Visit (INDEPENDENT_AMBULATORY_CARE_PROVIDER_SITE_OTHER): Payer: Medicare Other

## 2017-10-05 ENCOUNTER — Encounter: Payer: Self-pay | Admitting: Family Medicine

## 2017-10-05 DIAGNOSIS — E785 Hyperlipidemia, unspecified: Secondary | ICD-10-CM | POA: Diagnosis not present

## 2017-10-05 LAB — HEPATIC FUNCTION PANEL
ALT: 19 U/L (ref 0–35)
AST: 16 U/L (ref 0–37)
Albumin: 4.2 g/dL (ref 3.5–5.2)
Alkaline Phosphatase: 57 U/L (ref 39–117)
BILIRUBIN TOTAL: 0.9 mg/dL (ref 0.2–1.2)
Bilirubin, Direct: 0.1 mg/dL (ref 0.0–0.3)
Total Protein: 7.3 g/dL (ref 6.0–8.3)

## 2017-10-05 LAB — LDL CHOLESTEROL, DIRECT: Direct LDL: 87 mg/dL

## 2017-10-10 ENCOUNTER — Other Ambulatory Visit: Payer: Self-pay | Admitting: Family Medicine

## 2017-11-03 ENCOUNTER — Other Ambulatory Visit: Payer: Medicare Other

## 2017-11-06 ENCOUNTER — Other Ambulatory Visit (INDEPENDENT_AMBULATORY_CARE_PROVIDER_SITE_OTHER): Payer: Medicare Other

## 2017-11-06 DIAGNOSIS — E785 Hyperlipidemia, unspecified: Secondary | ICD-10-CM

## 2017-11-06 LAB — LDL CHOLESTEROL, DIRECT: LDL DIRECT: 84 mg/dL

## 2017-11-06 LAB — HEPATIC FUNCTION PANEL
ALT: 23 U/L (ref 0–35)
AST: 16 U/L (ref 0–37)
Albumin: 4.3 g/dL (ref 3.5–5.2)
Alkaline Phosphatase: 62 U/L (ref 39–117)
BILIRUBIN DIRECT: 0.2 mg/dL (ref 0.0–0.3)
BILIRUBIN TOTAL: 0.8 mg/dL (ref 0.2–1.2)
TOTAL PROTEIN: 7.5 g/dL (ref 6.0–8.3)

## 2017-11-09 ENCOUNTER — Other Ambulatory Visit: Payer: Self-pay | Admitting: Family Medicine

## 2017-11-09 DIAGNOSIS — E785 Hyperlipidemia, unspecified: Secondary | ICD-10-CM

## 2017-11-09 MED ORDER — ROSUVASTATIN CALCIUM 40 MG PO TABS: 40.0000 mg | ORAL_TABLET | Freq: Every day | ORAL | 3 refills | Status: DC

## 2017-11-30 ENCOUNTER — Other Ambulatory Visit: Payer: Self-pay

## 2017-11-30 ENCOUNTER — Ambulatory Visit: Payer: Medicare Other | Admitting: Family Medicine

## 2017-11-30 DIAGNOSIS — J301 Allergic rhinitis due to pollen: Secondary | ICD-10-CM | POA: Diagnosis not present

## 2017-11-30 DIAGNOSIS — K219 Gastro-esophageal reflux disease without esophagitis: Secondary | ICD-10-CM | POA: Diagnosis not present

## 2017-11-30 DIAGNOSIS — E785 Hyperlipidemia, unspecified: Secondary | ICD-10-CM

## 2017-11-30 DIAGNOSIS — J309 Allergic rhinitis, unspecified: Secondary | ICD-10-CM | POA: Insufficient documentation

## 2017-11-30 DIAGNOSIS — H6123 Impacted cerumen, bilateral: Secondary | ICD-10-CM

## 2017-11-30 DIAGNOSIS — E119 Type 2 diabetes mellitus without complications: Secondary | ICD-10-CM | POA: Diagnosis not present

## 2017-11-30 NOTE — Assessment & Plan Note (Signed)
Now on Crestor.  She will return in 1 week for repeat labs.

## 2017-11-30 NOTE — Progress Notes (Signed)
[name], MD Phone: [phone]  Amber Oliver is a 67 y.o. female who presents today for f/u.  DIABETES Disease Monitoring: Blood Sugar ranges-100ish in the am, <200 1-2 hrs after dinner Polyuria/phagia/dipsia- no      Optho- UTD Medications: Compliance- taking metformin Hypoglycemic symptoms- 1x in the past month, ate something and felt better  HYPERLIPIDEMIA Symptoms Chest pain on exertion:  no   Medications: Compliance- taking crestor Right upper quadrant pain- no  Muscle aches- none new  GERD:   Reflux symptoms: rare if she eats tomato sauce   Abd pain: no   Blood in stool: no  Dysphagia: no    Medication: prn omeprazole  Reports her bilateral ears feel stopped up.  She frequently has wax.  She notes some congestion and sneezing and itchy watery eyes consistent with allergies which she has a history of and takes an over-the-counter allergy medication for.  No vertigo.  No tinnitus.  Does feel little unsteady.      Social History   Tobacco Use  Smoking Status Former Smoker  . Packs/day: 0.25  . Years: 20.00  . Pack years: 5.00  . Types: Cigarettes  . Last attempt to quit: 05/09/1999  . Years since quitting: 18.5  Smokeless Tobacco Never Used     ROS see history of present illness  Objective  Physical Exam Vitals:   11/30/17 0851  BP: 116/74  Pulse: 70  Temp: 97.8 F (36.6 C)  SpO2: 98%    BP Readings from Last 3 Encounters:  11/30/17 116/74  09/01/17 130/84  05/27/17 130/70   Wt Readings from Last 3 Encounters:  11/30/17 175 lb 6.4 oz (79.6 kg)  09/01/17 182 lb 9.6 oz (82.8 kg)  05/27/17 181 lb (82.1 kg)    Physical Exam  Constitutional: No distress.  HENT:  Head: Normocephalic and atraumatic.  Mouth/Throat: Oropharynx is clear and moist.  Cerumen in bilateral ear canals, hearing to finger rub intact bilaterally  Eyes: Pupils are equal, round, and reactive to light. Conjunctivae are normal.  Cardiovascular: Normal rate,  regular rhythm and normal heart sounds.  Pulmonary/Chest: Effort normal and breath sounds normal.  Musculoskeletal: She exhibits no edema.  Neurological: She is alert.  Skin: Skin is warm and dry. She is not diaphoretic.     Assessment/Plan: Please see individual problem list.  Diabetes type 2, controlled She will continue metformin.  We will have her return for an A1c.  Hyperlipidemia LDL goal <70 Now on Crestor.  She will return in 1 week for repeat labs.  GERD (gastroesophageal reflux disease) Rarely symptomatic.  She will avoid food triggers.  Continue as needed omeprazole.  Allergic rhinitis Symptoms related to pollen.  She will continue over-the-counter allergy medication.  Cerumen impaction Irrigated today.  Successful on the right side with normal TM, left ear canal with continued cerumen.  Will refer to ENT for further management of this as she has tried Debrox over-the-counter with little benefit.   Orders Placed This Encounter  Procedures  . HgB A1c    Standing Status:   Future    Standing Expiration Date:   12/01/2018  . Ambulatory referral to ENT    Referral Priority:   Routine    Referral Type:   Consultation    Referral Reason:   Specialty Services Required    Requested Specialty:   Otolaryngology    Number of Visits Requested:   1    No orders of the defined types were placed in this encounter.  [name], MD [place]

## 2017-11-30 NOTE — Patient Instructions (Addendum)
Nice to see you. We will get you see ENT for your earwax issue. We will have you return next week for labs.

## 2017-11-30 NOTE — Assessment & Plan Note (Signed)
Rarely symptomatic.  She will avoid food triggers.  Continue as needed omeprazole.

## 2017-11-30 NOTE — Assessment & Plan Note (Addendum)
Irrigated today.  Successful on the right side with normal TM, left ear canal with continued cerumen.  Will refer to ENT for further management of this as she has tried Debrox over-the-counter with little benefit.

## 2017-11-30 NOTE — Assessment & Plan Note (Signed)
She will continue metformin.  We will have her return for an A1c.

## 2017-11-30 NOTE — Assessment & Plan Note (Signed)
Symptoms related to pollen.  She will continue over-the-counter allergy medication.

## 2017-12-11 ENCOUNTER — Other Ambulatory Visit (INDEPENDENT_AMBULATORY_CARE_PROVIDER_SITE_OTHER): Payer: Medicare Other

## 2017-12-11 DIAGNOSIS — E119 Type 2 diabetes mellitus without complications: Secondary | ICD-10-CM | POA: Diagnosis not present

## 2017-12-11 DIAGNOSIS — E785 Hyperlipidemia, unspecified: Secondary | ICD-10-CM | POA: Diagnosis not present

## 2017-12-11 LAB — HEPATIC FUNCTION PANEL
ALBUMIN: 4.3 g/dL (ref 3.5–5.2)
ALK PHOS: 60 U/L (ref 39–117)
ALT: 23 U/L (ref 0–35)
AST: 18 U/L (ref 0–37)
BILIRUBIN TOTAL: 0.6 mg/dL (ref 0.2–1.2)
Bilirubin, Direct: 0.2 mg/dL (ref 0.0–0.3)
Total Protein: 7.2 g/dL (ref 6.0–8.3)

## 2017-12-11 LAB — LDL CHOLESTEROL, DIRECT: Direct LDL: 56 mg/dL

## 2017-12-11 LAB — HEMOGLOBIN A1C: HEMOGLOBIN A1C: 6.7 % — AB (ref 4.6–6.5)

## 2017-12-31 ENCOUNTER — Ambulatory Visit: Payer: Medicare Other | Admitting: Internal Medicine

## 2017-12-31 ENCOUNTER — Encounter: Payer: Self-pay | Admitting: Internal Medicine

## 2017-12-31 VITALS — BP 126/78 | HR 76 | Ht 65.0 in | Wt 173.0 lb

## 2017-12-31 DIAGNOSIS — J4520 Mild intermittent asthma, uncomplicated: Secondary | ICD-10-CM

## 2017-12-31 NOTE — Patient Instructions (Signed)
Will plan to decrease Qvar dose at next refill to [facility]  Avoid allergens

## 2017-12-31 NOTE — Progress Notes (Signed)
MRN# [id number] Amber Oliver [birth year]   CC: Chief Complaint  Patient presents with  . Follow-up  . Asthma    denies any symptoms      Brief History: Synopsis: 67 year old past medical history of hyperlipidemia diabetes presented to [facility] 09/2014 evaluation of chronic cough. Diagnosed bronchitis December 2015, chronic cough since then, usually has one cold per year treated with supportive care. PFTs with mild restriction TLC 67, RV 48, FEV1 82 MVC 87 FEV1/FVC 72, ERV 35, DLCO 114. Placed on Qvar trial with rapid improvement. Trial off lisinopril for 5 days, no improvement. Negative CT Chest. Doing well on ICS.  Events Since last clinic visit Seen for follow up of Asthma. Very well controlled with QVAR Had bronchitis from odor 1 week ago but resolved with time No need for rescue inhaler in1 year No signs of infection at this time Plan to decrease dosage of Qvar at next refill  Medication:    Current Outpatient Medications:  .  ACCU-CHEK AVIVA PLUS test strip, USE AS DIRECTED TO TEST BLOOD SUGAR 2 TIMES A DAY WITH ACCU CHEK MONITOR, Disp: 100 each, Rfl: 3 .  aspirin 81 MG tablet, Take 1 tablet (81 mg total) by mouth daily., Disp: 30 tablet, Rfl:  .  Beclomethasone Diprop HFA (QVAR REDIHALER) 80 MCG/ACT AERB, Inhale 1 puff into the lungs 2 (two) times daily., Disp: 1 Inhaler, Rfl: 5 .  Calcium Carbonate-Vit D-Min (CALCIUM 1200 PO), Take by mouth., Disp: , Rfl:  .  cholecalciferol (VITAMIN D) 1000 UNITS tablet, Take 1,000 Units by mouth 2 (two) times daily.  , Disp: , Rfl:  .  fish oil-omega-3 fatty acids 1000 MG capsule, Take 2 g by mouth daily. MEGA RED BRAND , Disp: , Rfl:  .  metFORMIN (GLUCOPHAGE) 500 MG tablet, Take 2 tablets (1,000 mg total) by mouth 2 (two) times daily with a meal., Disp: 360 tablet, Rfl: 3 .  omeprazole (PRILOSEC) 40 MG capsule, Take 40 mg by mouth daily as needed. , Disp: , Rfl:  .  POTASSIUM PO, Take 500 mg by mouth daily., Disp: , Rfl:  .   rosuvastatin (CRESTOR) 40 MG tablet, Take 1 tablet (40 mg total) by mouth daily., Disp: 90 tablet, Rfl: 3   Review of Systems: Gen:  Denies  fever, sweats, chills HEENT: Denies blurred vision, double vision, ear pain, eye pain, hearing loss, nose bleeds, sore throat Cvc:  No dizziness, chest pain or heaviness Resp:   Admits GB:TDVV non productive cough (intermittent), significantly  Other:  All other systems negative   Allergies:  Ciprofloxacin; Sulfa drugs cross reactors; and Sulfa antibiotics  Physical Examination:  VS: BP 126/78 (BP Location: Left Arm, Cuff Size: Normal)   Pulse 76   Ht 5\' 5"  (1.651 m)   Wt 173 lb (78.5 kg)   SpO2 98%   BMI 28.79 kg/m   General Appearance: No distress  HEENT: PERRLA, no ptosis, no other lesions noticed Pulmonary:normal breath sounds., diaphragmatic excursion normal.No wheezing, No rales   Cardiovascular:  Normal S1,S2.  No m/r/g.     Abdomen:Exam: Benign, Soft, non-tender, No masses  Skin:   warm, no rashes, no ecchymosis  Extremities: normal, no cyanosis, clubbing, warm with normal capillary refill.        A/P 67 yo Female seen in follow up for [place]  now with rapid clinical improvement since being placed on Qvar.   Extrinsic asthma Showed much clinical improvement since starting Qvar, now this is maintenance tx.  PFT  testing and follow-up imaging have not revealed any organic cause or structural cause for her symptoms. I suspect after the bronchitis episode she may have developed bronchial hyperactivity and asthma-like symptoms which are manifesting as chronic cough, or cough variant asthma. This has been clinically reduced with the use of an inhaled steroid such as Qvar.   Plan  -Qvar 80mcg, 1 puff QD will plan to setp down therapy to 40 mcg prior to next refill in 4 months.  -Avoid any forms of smoke (first and smoking, secondhand smoking, E-cig, vaper, burning trash, etc.).   Patient satisfied with Plan of  action and management. All questions answered Follow up in 1 year as needed  Corrin Parker, M.D.  [facility]  Medical Director [place] Director [facility] Cardio-Pulmonary Department

## 2018-02-26 LAB — HM DIABETES EYE EXAM

## 2018-03-01 ENCOUNTER — Encounter: Payer: Self-pay | Admitting: Family Medicine

## 2018-04-09 ENCOUNTER — Encounter: Payer: Self-pay | Admitting: Family Medicine

## 2018-04-09 MED ORDER — METFORMIN HCL 500 MG PO TABS: 500.0000 mg | ORAL_TABLET | Freq: Two times a day (BID) | ORAL | 3 refills | Status: DC

## 2018-04-28 ENCOUNTER — Other Ambulatory Visit: Payer: Self-pay | Admitting: Internal Medicine

## 2018-05-28 ENCOUNTER — Ambulatory Visit: Payer: Medicare Other

## 2018-06-02 ENCOUNTER — Ambulatory Visit: Payer: Medicare Other | Admitting: Family Medicine

## 2018-06-02 ENCOUNTER — Encounter: Payer: Self-pay | Admitting: Family Medicine

## 2018-06-02 ENCOUNTER — Ambulatory Visit (INDEPENDENT_AMBULATORY_CARE_PROVIDER_SITE_OTHER): Payer: Medicare Other

## 2018-06-02 VITALS — BP 122/80 | HR 70 | Temp 98.4°F | Resp 14 | Ht 65.0 in | Wt 163.0 lb

## 2018-06-02 VITALS — BP 122/80 | HR 70 | Temp 98.4°F | Ht 65.0 in | Wt 163.0 lb

## 2018-06-02 DIAGNOSIS — N6489 Other specified disorders of breast: Secondary | ICD-10-CM

## 2018-06-02 DIAGNOSIS — S4991XA Unspecified injury of right shoulder and upper arm, initial encounter: Secondary | ICD-10-CM

## 2018-06-02 DIAGNOSIS — J4530 Mild persistent asthma, uncomplicated: Secondary | ICD-10-CM

## 2018-06-02 DIAGNOSIS — E119 Type 2 diabetes mellitus without complications: Secondary | ICD-10-CM | POA: Diagnosis not present

## 2018-06-02 DIAGNOSIS — E785 Hyperlipidemia, unspecified: Secondary | ICD-10-CM

## 2018-06-02 DIAGNOSIS — R634 Abnormal weight loss: Secondary | ICD-10-CM

## 2018-06-02 DIAGNOSIS — S4990XA Unspecified injury of shoulder and upper arm, unspecified arm, initial encounter: Secondary | ICD-10-CM | POA: Insufficient documentation

## 2018-06-02 DIAGNOSIS — Z0000 Encounter for general adult medical examination without abnormal findings: Secondary | ICD-10-CM

## 2018-06-02 DIAGNOSIS — T148XXA Other injury of unspecified body region, initial encounter: Secondary | ICD-10-CM

## 2018-06-02 DIAGNOSIS — R8290 Unspecified abnormal findings in urine: Secondary | ICD-10-CM

## 2018-06-02 DIAGNOSIS — S2000XA Contusion of breast, unspecified breast, initial encounter: Secondary | ICD-10-CM

## 2018-06-02 DIAGNOSIS — Z23 Encounter for immunization: Secondary | ICD-10-CM | POA: Diagnosis not present

## 2018-06-02 LAB — COMPREHENSIVE METABOLIC PANEL
ALK PHOS: 65 U/L (ref 39–117)
ALT: 15 U/L (ref 0–35)
AST: 13 U/L (ref 0–37)
Albumin: 4.4 g/dL (ref 3.5–5.2)
BUN: 14 mg/dL (ref 6–23)
CO2: 28 mEq/L (ref 19–32)
Calcium: 10.2 mg/dL (ref 8.4–10.5)
Chloride: 106 mEq/L (ref 96–112)
Creatinine, Ser: 0.98 mg/dL (ref 0.40–1.20)
GFR: 59.9 mL/min — AB (ref >60.00)
GLUCOSE: 113 mg/dL — AB (ref 70–99)
POTASSIUM: 4.1 meq/L (ref 3.5–5.1)
Sodium: 141 mEq/L (ref 135–145)
TOTAL PROTEIN: 7.7 g/dL (ref 6.0–8.3)
Total Bilirubin: 0.6 mg/dL (ref 0.2–1.2)

## 2018-06-02 LAB — MICROALBUMIN / CREATININE URINE RATIO
Creatinine,U: 78.7 mg/dL
MICROALB/CREAT RATIO: 29.9 mg/g (ref 0.0–30.0)
Microalb, Ur: 23.5 mg/dL — ABNORMAL HIGH (ref 0.0–1.9)

## 2018-06-02 LAB — CBC
HEMATOCRIT: 41.8 % (ref 36.0–46.0)
HEMOGLOBIN: 13.7 g/dL (ref 12.0–15.0)
MCHC: 32.7 g/dL (ref 30.0–36.0)
MCV: 87.3 fl (ref 78.0–100.0)
Platelets: 257 10*3/uL (ref 150.0–400.0)
RBC: 4.79 Mil/uL (ref 3.87–5.11)
RDW: 14.4 % (ref 11.5–15.5)
WBC: 6.7 10*3/uL (ref 4.0–10.5)

## 2018-06-02 LAB — TSH: TSH: 2.43 u[IU]/mL (ref 0.35–4.50)

## 2018-06-02 LAB — POCT URINALYSIS DIPSTICK
Bilirubin, UA: NEGATIVE
GLUCOSE UA: NEGATIVE
Ketones, UA: NEGATIVE
LEUKOCYTES UA: NEGATIVE
Nitrite, UA: NEGATIVE
Protein, UA: POSITIVE — AB
SPEC GRAV UA: 1.015 (ref 1.010–1.025)
Urobilinogen, UA: 0.2 E.U./dL
pH, UA: 6.5 (ref 5.0–8.0)

## 2018-06-02 LAB — HEMOGLOBIN A1C: Hgb A1c MFr Bld: 6.5 % (ref 4.6–6.5)

## 2018-06-02 LAB — URINALYSIS, MICROSCOPIC ONLY

## 2018-06-02 MED ORDER — PNEUMOCOCCAL VAC POLYVALENT 25 MCG/0.5ML IJ INJ: 0.5000 mL | INJECTION | Freq: Once | INTRAMUSCULAR | 0 refills | Status: AC

## 2018-06-02 NOTE — Assessment & Plan Note (Signed)
Right upper arm injury with bruising is the only apparent injury.  No pain.  No underlying defects.  She will monitor.

## 2018-06-02 NOTE — Assessment & Plan Note (Signed)
Well-controlled.  Check A1c.

## 2018-06-02 NOTE — Assessment & Plan Note (Signed)
Well-controlled.  Continue Qvar. 

## 2018-06-02 NOTE — Progress Notes (Signed)
Subjective:   Amber Oliver is a 67 y.o. female who presents for Medicare Annual (Subsequent) preventive examination.  Review of Systems:  No ROS.  Medicare Wellness Visit. Additional risk factors are reflected in the social history.  Cardiac Risk Factors include: advanced age (>54men, >24 women);hypertension;diabetes mellitus     Objective:     Vitals: BP 122/80   Pulse 70   Temp 98.4 F (36.9 C) (Oral)   Resp 14   Ht 5\' 5"  (1.651 m)   Wt 163 lb (73.9 kg)   SpO2 95%   BMI 27.12 kg/m   Body mass index is 27.12 kg/m.  Advanced Directives 06/02/2018 05/27/2017  Does Patient Have a Medical Advance Directive? No No  Does patient want to make changes to medical advance directive? - Yes (MAU/Ambulatory/Procedural Areas - Information given)  Would patient like information on creating a medical advance directive? No - Patient declined -    Tobacco Social History   Tobacco Use  Smoking Status Former Smoker  . Packs/day: 0.25  . Years: 20.00  . Pack years: 5.00  . Types: Cigarettes  . Last attempt to quit: 05/09/1999  . Years since quitting: 19.0  Smokeless Tobacco Never Used     Counseling given: Not Answered   Clinical Intake:  Pre-visit preparation completed: Yes  Pain : No/denies pain     Nutritional Status: BMI 25 -29 Overweight Diabetes: Yes(Followed by pcp)  How often do you need to have someone help you when you read instructions, pamphlets, or other written materials from your doctor or pharmacy?: 1 - Never  Interpreter Needed?: No     Past Medical History:  Diagnosis Date  . Diabetes mellitus   . Hyperlipidemia   . Hypertension   . Kidney stone    Past Surgical History:  Procedure Laterality Date  . TRIGGER FINGER RELEASE     Family History  Problem Relation Age of Onset  . Heart disease Mother   . Heart failure Mother   . Diabetes Father   . Lung cancer Father   . Diabetes Sister   . Diabetes Brother   . Cancer Maternal  Grandmother        breast   Social History   Socioeconomic History  . Marital status: Married    Spouse name: Not on file  . Number of children: Not on file  . Years of education: Not on file  . Highest education level: Not on file  Occupational History  . Not on file  Social Needs  . Financial resource strain: Not on file  . Food insecurity:    Worry: Not on file    Inability: Not on file  . Transportation needs:    Medical: Not on file    Non-medical: Not on file  Tobacco Use  . Smoking status: Former Smoker    Packs/day: 0.25    Years: 20.00    Pack years: 5.00    Types: Cigarettes    Last attempt to quit: 05/09/1999    Years since quitting: 19.0  . Smokeless tobacco: Never Used  Substance and Sexual Activity  . Alcohol use: No    Alcohol/week: 0.0 standard drinks  . Drug use: No  . Sexual activity: Never  Lifestyle  . Physical activity:    Days per week: Not on file    Minutes per session: Not on file  . Stress: Not on file  Relationships  . Social connections:    Talks on phone: Not on file  Gets together: Not on file    Attends religious service: Not on file    Active member of club or organization: Not on file    Attends meetings of clubs or organizations: Not on file    Relationship status: Not on file  Other Topics Concern  . Not on file  Social History Narrative  . Not on file    Outpatient Encounter Medications as of 06/02/2018  Medication Sig  . ACCU-CHEK AVIVA PLUS test strip USE AS DIRECTED TO TEST BLOOD SUGAR 2 TIMES A DAY WITH ACCU CHEK MONITOR  . aspirin 81 MG tablet Take 1 tablet (81 mg total) by mouth daily. (Patient taking differently: Take 81 mg by mouth daily. Pt is only taking medication twice weekly)  . Calcium Carbonate-Vit D-Min (CALCIUM 1200 PO) Take by mouth.  . cholecalciferol (VITAMIN D) 1000 UNITS tablet Take 1,000 Units by mouth 2 (two) times daily.    . fish oil-omega-3 fatty acids 1000 MG capsule Take 2 g by mouth daily.  MEGA RED BRAND   . metFORMIN (GLUCOPHAGE) 500 MG tablet Take 1 tablet (500 mg total) by mouth 2 (two) times daily with a meal.  . omeprazole (PRILOSEC) 40 MG capsule Take 40 mg by mouth daily as needed.   Marland Kitchen POTASSIUM PO Take 500 mg by mouth daily.  Marland Kitchen QVAR REDIHALER 80 MCG/ACT inhaler INHALE 1 PUFF TWICE DAILY  . rosuvastatin (CRESTOR) 40 MG tablet Take 1 tablet (40 mg total) by mouth daily.   No facility-administered encounter medications on file as of 06/02/2018.     Activities of Daily Living In your present state of health, do you have any difficulty performing the following activities: 06/02/2018  Hearing? N  Vision? N  Difficulty concentrating or making decisions? N  Walking or climbing stairs? N  Dressing or bathing? N  Doing errands, shopping? N  Preparing Food and eating ? N  Using the Toilet? N  In the past six months, have you accidently leaked urine? N  Do you have problems with loss of bowel control? N  Managing your Medications? N  Managing your Finances? N  Housekeeping or managing your Housekeeping? N  Some recent data might be hidden    Patient Care Team: Leone Haven, MD as PCP - General (Family Medicine)    Assessment:   This is a routine wellness examination for Amber Oliver.  The goal of the wellness visit is to assist the patient how to close the gaps in care and create a preventative care plan for the patient.   The roster of all physicians providing medical care to patient is listed in the Snapshot section of the chart.  Taking calcium VIT D as appropriate/Osteoporosis risk reviewed.    Safety issues reviewed; Smoke and carbon monoxide detectors in the home. Firearm safety discussed. Wears seatbelts when driving or riding with others. No violence in the home.  They do not have excessive sun exposure.  Discussed the need for sun protection: hats, long sleeves and the use of sunscreen if there is significant sun exposure.  Patient is alert, normal  appearance, oriented to person/place/and time.  Correctly identified the president of the [place] and recalls of 3/3 words. Performs simple calculations and can read correct time from watch face.  Displays appropriate judgement.  No new identified risk were noted.  No failures at ADL's or IADL's.    BMI- discussed the importance of a healthy diet, water intake and the benefits of aerobic exercise. Educational material provided.  24 hour diet recall: Regular diet  Dental- every 6 months.  Sleep patterns- Sleeps 6-7 hours at night.    Exercise Activities and Dietary recommendations Current Exercise Habits: The patient does not participate in regular exercise at present  Goals      Patient Stated   . Increase physical activity (pt-stated)     Stay active with her grandchild Healthy diet Maintain weight       Fall Risk Fall Risk  06/02/2018 05/27/2017 02/24/2017 08/27/2016 06/06/2015  Falls in the past year? No No No No No   Depression Screen PHQ 2/9 Scores 06/02/2018 05/27/2017 02/24/2017 02/24/2017  PHQ - 2 Score 0 0 0 0  PHQ- 9 Score - 0 1 -     Cognitive Function MMSE - Mini Mental State Exam 05/27/2017  Orientation to time 5  Orientation to Place 5  Registration 3  Attention/ Calculation 5  Recall 3  Language- name 2 objects 2  Language- repeat 1  Language- follow 3 step command 3  Language- read & follow direction 1  Write a sentence 1  Copy design 1  Total score 30     6CIT Screen 06/02/2018  What Year? 0 points  What month? 0 points  What time? 0 points  Count back from 20 0 points  Months in reverse 0 points  Repeat phrase 0 points  Total Score 0    Immunization History  Administered Date(s) Administered  . Influenza, High Dose Seasonal PF 05/21/2016, 05/27/2017, 06/02/2018  . Influenza,inj,Quad PF,6+ Mos 05/11/2015  . Pneumococcal Conjugate-13 05/11/2015  . Pneumococcal Polysaccharide-23 03/31/2013  . Tdap 12/22/2012  . Zoster 03/18/2013    Screening Tests Health Maintenance  Topic Date Due  . URINE MICROALBUMIN  02/24/2018  . INFLUENZA VACCINE  03/18/2018  . PNA vac Low Risk Adult (2 of 2 - PPSV23) 03/31/2018  . HEMOGLOBIN A1C  06/12/2018  . OPHTHALMOLOGY EXAM  02/27/2019  . FOOT EXAM  06/03/2019  . MAMMOGRAM  09/04/2019  . TETANUS/TDAP  12/23/2022  . COLONOSCOPY  08/22/2025  . DEXA SCAN  Completed  . Hepatitis C Screening  Completed      Plan:   End of life planning; Advance aging; Advanced directives discussed. Additional information to discuss with her family declined at this time.   I have personally reviewed and noted the following in the patient's chart:   . Medical and social history . Use of alcohol, tobacco or illicit drugs  . Current medications and supplements . Functional ability and status . Nutritional status . Physical activity . Advanced directives . List of other physicians . Hospitalizations, surgeries, and ER visits in previous 12 months . Vitals . Screenings to include cognitive, depression, and falls . Referrals and appointments  In addition, I have reviewed and discussed with patient certain preventive protocols, quality metrics, and best practice recommendations. A written personalized care plan for preventive services as well as general preventive health recommendations were provided to patient.     Varney Biles, LPN  [id number]

## 2018-06-02 NOTE — Patient Instructions (Addendum)
  [name] , Thank you for taking time to come for your Medicare Wellness Visit. I appreciate your ongoing commitment to your health goals. Please review the following plan we discussed and let me know if I can assist you in the future.   These are the goals we discussed: Goals      Patient Stated   . Increase physical activity (pt-stated)     Stay active with her grandchild Healthy diet Maintain weight       This is a list of the screening recommended for you and due dates:  Health Maintenance  Topic Date Due  . Urine Protein Check  02/24/2018  . Flu Shot  03/18/2018  . Pneumonia vaccines (2 of 2 - PPSV23) 03/31/2018  . Hemoglobin A1C  06/12/2018  . Eye exam for diabetics  02/27/2019  . Complete foot exam   06/03/2019  . Mammogram  09/04/2019  . Tetanus Vaccine  12/23/2022  . Colon Cancer Screening  08/22/2025  . DEXA scan (bone density measurement)  Completed  .  Hepatitis C: One time screening is recommended by Center for Disease Control  (CDC) for  adults born from [birth year] through 1966.   Completed

## 2018-06-02 NOTE — Assessment & Plan Note (Signed)
Adequately controlled.  Continue Crestor.

## 2018-06-02 NOTE — Patient Instructions (Signed)
Nice to see you. We will get lab work today and contact you with the results. Please monitor your arm and if you develop any symptoms please let us know. Please monitor your weight.  We will recheck you in about a month.

## 2018-06-02 NOTE — Addendum Note (Signed)
Addended by: Leone Haven on: 06/02/2018 09:28 AM   Modules accepted: Orders

## 2018-06-02 NOTE — Assessment & Plan Note (Signed)
Likely related to her aspirin or underlying cause of bruising though we will obtain a mammogram and ultrasound to evaluate.

## 2018-06-02 NOTE — Assessment & Plan Note (Signed)
Seems to be somewhat unintentional.  She is up-to-date on cancer screening for her age.  Really the only other symptom she has had is bruising.  We will check a CBC to evaluate platelets and other hematologic abnormalities.  We will obtain additional lab work as outlined below.  Plan to recheck in about a month.

## 2018-06-02 NOTE — Progress Notes (Signed)
[name], MD Phone: [phone]  Amber Oliver is a 67 y.o. female who presents today for f/u.  CC: dm, hld, asthma, bruising, right arm injury  DIABETES Disease Monitoring: Blood Sugar ranges-90-110 Polyuria/phagia/dipsia- no      Optho- UTD Medications: Compliance- taking metformin Hypoglycemic symptoms- no  HYPERLIPIDEMIA Symptoms Chest pain on exertion:  no    Medications: Compliance- taking crestor Right upper quadrant pain- no  Muscle aches- no  Asthma: Using her Qvar daily.  Recently getting over bronchitis.  She did have some cough and wheezing with that though outside of that illness she has had no significant cough, wheezing, shortness of breath, or nighttime symptoms.  She reports a lift gate fell on her right upper arm over the weekend.  Notes there is a bruise otherwise no pain.  She has had no movement issues.  She does report some bruising that she would be unsure of how she would get it.  She would cut herself with the slightest injury on her arms.  She decreased her aspirin to turn twice weekly and there has been some benefit.  She reports she has lost some weight.  She is down about 10 pounds since last check though she is down 20 pounds.  Since the beginning of the year.  She denies stomach pain, nausea, vomiting, diarrhea, early satiety, night sweats, itching, depression, and anxiety.  She notes she feels well.  Breast cancer screenings up-to-date.  She does report a bruise on her left breast.  Colonoscopy is up-to-date.  She notes she never had any abnormal Pap smears.  She notes she has not really been trying to lose weight.  She does not have as much of an appetite as previously though she reports she is eating normal meals 2-3 times a day.   Social History   Tobacco Use  Smoking Status Former Smoker  . Packs/day: 0.25  . Years: 20.00  . Pack years: 5.00  . Types: Cigarettes  . Last attempt to quit: 05/09/1999  . Years since quitting: 19.0    Smokeless Tobacco Never Used     ROS see history of present illness  Objective  Physical Exam Vitals:   06/02/18 0826  BP: 122/80  Pulse: 70  Temp: 98.4 F (36.9 C)  SpO2: 95%    BP Readings from Last 3 Encounters:  06/02/18 122/80  12/31/17 126/78  11/30/17 116/74   Wt Readings from Last 3 Encounters:  06/02/18 163 lb (73.9 kg)  12/31/17 173 lb (78.5 kg)  11/30/17 175 lb 6.4 oz (79.6 kg)    Physical Exam  Constitutional: No distress.  Neck: Neck supple.  Cardiovascular: Normal rate, regular rhythm and normal heart sounds.  Pulmonary/Chest: Effort normal and breath sounds normal.  Genitourinary:  Genitourinary Comments: Left breast with bruise in upper inner quadrant, no underlying palpable defects, no tenderness, no other abnormalities of the left breast, right breast with no tenderness, masses, skin changes, or nipple inversion, no axillary masses bilaterally  Musculoskeletal: She exhibits no edema.  Right mid bicep with bruise noted, no palpable underlying defects, no bony defects, good range of motion in her right upper extremity, 5/5 strength bilateral biceps, triceps, and grip, sensation light touch intact bilateral upper extremities, small bruise over the proximal volar aspect lower left arm with no underlying palpable defect  Lymphadenopathy:    She has no cervical adenopathy.    She has no axillary adenopathy.       Right: No inguinal and no supraclavicular adenopathy present.  Left: No inguinal and no supraclavicular adenopathy present.  Neurological: She is alert.  Skin: Skin is warm and dry. She is not diaphoretic.     Assessment/Plan: Please see individual problem list.  Extrinsic asthma Well-controlled.  Continue Qvar.  Diabetes type 2, controlled Well-controlled.  Check A1c.  Hyperlipidemia LDL goal <70 Adequately controlled.  Continue Crestor.  Bruising Likely related to her aspirin though we will check a CBC to evaluate for potential  cause.  Arm injury Right upper arm injury with bruising is the only apparent injury.  No pain.  No underlying defects.  She will monitor.  Weight loss Seems to be somewhat unintentional.  She is up-to-date on cancer screening for her age.  Really the only other symptom she has had is bruising.  We will check a CBC to evaluate platelets and other hematologic abnormalities.  We will obtain additional lab work as outlined below.  Plan to recheck in about a month.  Bruise of breast Likely related to her aspirin or underlying cause of bruising though we will obtain a mammogram and ultrasound to evaluate.   Health Maintenance: Pneumovax vaccine prescription given to take to the pharmacy.  Orders Placed This Encounter  Procedures  . MM DIAG BREAST TOMO BILATERAL    Standing Status:   Future    Standing Expiration Date:   08/03/2019    Order Specific Question:   Reason for Exam (SYMPTOM  OR DIAGNOSIS REQUIRED)    Answer:   left breast bruise upper inner quadrant, no underlying mass    Order Specific Question:   Preferred imaging location?    Answer:   [facility]  . US BREAST LTD UNI LEFT INC AXILLA    Standing Status:   Future    Standing Expiration Date:   08/03/2019    Order Specific Question:   Reason for Exam (SYMPTOM  OR DIAGNOSIS REQUIRED)    Answer:   left breast bruise upper inner quadrant, no underlying mass    Order Specific Question:   Preferred imaging location?    Answer:   [facility]  . US BREAST LTD UNI RIGHT INC AXILLA    Standing Status:   Future    Standing Expiration Date:   08/03/2019    Order Specific Question:   Reason for Exam (SYMPTOM  OR DIAGNOSIS REQUIRED)    Answer:   left breast bruise upper inner quadrant, no underlying mass    Order Specific Question:   Preferred imaging location?    Answer:   [facility]  . Flu vaccine HIGH DOSE PF (Fluzone High dose)  . CBC  . Comp Met (CMET)  . HgB A1c  . TSH  . Urine Microalbumin w/creat. ratio    . POCT Urinalysis Dipstick    Meds ordered this encounter  Medications  . pneumococcal 23 valent vaccine (PNU-IMMUNE) 25 MCG/0.5ML injection    Sig: Inject 0.5 mLs into the muscle once for 1 dose.    Dispense:  0.5 mL    Refill:  0     [name], MD [place]

## 2018-06-02 NOTE — Assessment & Plan Note (Signed)
Likely related to her aspirin though we will check a CBC to evaluate for potential cause.

## 2018-06-03 ENCOUNTER — Other Ambulatory Visit: Payer: Self-pay | Admitting: Family Medicine

## 2018-06-03 DIAGNOSIS — N179 Acute kidney failure, unspecified: Secondary | ICD-10-CM

## 2018-06-03 DIAGNOSIS — R809 Proteinuria, unspecified: Secondary | ICD-10-CM

## 2018-06-03 NOTE — Progress Notes (Signed)
I have reviewed the above note and agree.  Madeline Bebout, M.D.  

## 2018-06-08 ENCOUNTER — Other Ambulatory Visit: Payer: Self-pay | Admitting: Family Medicine

## 2018-06-08 ENCOUNTER — Other Ambulatory Visit (INDEPENDENT_AMBULATORY_CARE_PROVIDER_SITE_OTHER): Payer: Medicare Other

## 2018-06-08 DIAGNOSIS — R809 Proteinuria, unspecified: Secondary | ICD-10-CM

## 2018-06-08 DIAGNOSIS — N179 Acute kidney failure, unspecified: Secondary | ICD-10-CM

## 2018-06-08 LAB — POCT URINALYSIS DIPSTICK
BILIRUBIN UA: NEGATIVE
Glucose, UA: NEGATIVE
KETONES UA: NEGATIVE
Leukocytes, UA: NEGATIVE
Nitrite, UA: NEGATIVE
Protein, UA: POSITIVE — AB
Spec Grav, UA: 1.01 (ref 1.010–1.025)
UROBILINOGEN UA: 0.2 U/dL
pH, UA: 7 (ref 5.0–8.0)

## 2018-06-08 LAB — BASIC METABOLIC PANEL
BUN: 17 mg/dL (ref 6–23)
CHLORIDE: 105 meq/L (ref 96–112)
CO2: 28 mEq/L (ref 19–32)
CREATININE: 1.08 mg/dL (ref 0.40–1.20)
Calcium: 10.3 mg/dL (ref 8.4–10.5)
GFR: 53.54 mL/min — ABNORMAL LOW (ref >60.00)
Glucose, Bld: 112 mg/dL — ABNORMAL HIGH (ref 70–99)
Potassium: 4 mEq/L (ref 3.5–5.1)
Sodium: 140 mEq/L (ref 135–145)

## 2018-06-08 NOTE — Addendum Note (Signed)
Addended by: Leeanne Rio on: 06/08/2018 05:15 PM   Modules accepted: Orders

## 2018-06-09 LAB — URINALYSIS, MICROSCOPIC ONLY
BACTERIA UA: NONE SEEN
Casts: NONE SEEN /lpf

## 2018-06-15 ENCOUNTER — Other Ambulatory Visit: Payer: Self-pay | Admitting: Family Medicine

## 2018-06-15 ENCOUNTER — Encounter: Payer: Self-pay | Admitting: Family Medicine

## 2018-06-15 DIAGNOSIS — R809 Proteinuria, unspecified: Secondary | ICD-10-CM

## 2018-06-17 ENCOUNTER — Encounter: Payer: Self-pay | Admitting: Family Medicine

## 2018-06-20 ENCOUNTER — Telehealth: Payer: Self-pay | Admitting: Family Medicine

## 2018-06-20 DIAGNOSIS — S2000XA Contusion of breast, unspecified breast, initial encounter: Secondary | ICD-10-CM

## 2018-06-20 DIAGNOSIS — N6489 Other specified disorders of breast: Secondary | ICD-10-CM

## 2018-06-20 NOTE — Telephone Encounter (Signed)
Please let the patient know that I received her mammogram results.  It appears they felt the bruise was a benign finding though they did recommend 6-month follow-up imaging.  I have placed an order for this.  I will forward to [facility] as well to get this scheduled. It looks like [facility].

## 2018-06-21 NOTE — Telephone Encounter (Signed)
Called and spoke with patient. Pt advised and voiced understanding.  

## 2018-07-05 ENCOUNTER — Ambulatory Visit: Payer: Medicare Other | Admitting: Family Medicine

## 2018-07-05 ENCOUNTER — Encounter: Payer: Self-pay | Admitting: Family Medicine

## 2018-07-05 DIAGNOSIS — R809 Proteinuria, unspecified: Secondary | ICD-10-CM

## 2018-07-05 DIAGNOSIS — N6489 Other specified disorders of breast: Secondary | ICD-10-CM

## 2018-07-05 DIAGNOSIS — S2000XA Contusion of breast, unspecified breast, initial encounter: Secondary | ICD-10-CM

## 2018-07-05 DIAGNOSIS — N179 Acute kidney failure, unspecified: Secondary | ICD-10-CM | POA: Insufficient documentation

## 2018-07-05 DIAGNOSIS — R634 Abnormal weight loss: Secondary | ICD-10-CM | POA: Diagnosis not present

## 2018-07-05 DIAGNOSIS — F439 Reaction to severe stress, unspecified: Secondary | ICD-10-CM | POA: Insufficient documentation

## 2018-07-05 DIAGNOSIS — N182 Chronic kidney disease, stage 2 (mild): Secondary | ICD-10-CM | POA: Insufficient documentation

## 2018-07-05 NOTE — Assessment & Plan Note (Signed)
Undetermined cause.  Discussed adequate hydration.  She will avoid NSAIDs.  She will see nephrology as planned.  Discussed if they determined a structural issue she may need to see urology.

## 2018-07-05 NOTE — Progress Notes (Signed)
[name], MD Phone: [phone]  Amber Oliver is a 67 y.o. female who presents today for follow-up.  CC: Weight loss, AKI, breast bruise, stress  Patient's weight is down a little over 1 pound since her last visit.  Today she reports that she has been quite stressed over the last year regarding her husband and has medical issues.  She has had decreased appetite with that.  She notes some anxiety related to this.  No depression.  She notes she has remained active throughout this time.  She notes no early satiety, abdominal pain, productive cough, vomiting, diarrhea, chest pain, shortness of breath, blood in her stools, night sweats, itching, or rash.  She had her mammogram and she is due for 36-month follow-up related to likely bruising.  She notes the bruising has resolved.  She had protein in her urine and she has an appointment with nephrology in about a month.  Her GFR has declined some.  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  On review of imaging it appears this was read as a normal anatomic variant.  It appears she also had scattered punctate hyperdensities in the bilateral kidneys.  She tries to stay hydrated and avoids Aleve and ibuprofen.  Social History   Tobacco Use  Smoking Status Former Smoker  . Packs/day: 0.25  . Years: 20.00  . Pack years: 5.00  . Types: Cigarettes  . Last attempt to quit: 05/09/1999  . Years since quitting: 19.1  Smokeless Tobacco Never Used     ROS see history of present illness  Objective  Physical Exam Vitals:   07/05/18 1518 07/05/18 1859  BP: 106/80   Pulse: 66   Temp: 97.9 F (36.6 C)   SpO2: 91% 98%    BP Readings from Last 3 Encounters:  07/05/18 106/80  06/02/18 122/80  06/02/18 122/80   Wt Readings from Last 3 Encounters:  07/05/18 161 lb 9.6 oz (73.3 kg)  06/02/18 163 lb (73.9 kg)  06/02/18 163 lb (73.9 kg)    Physical Exam  Constitutional: No distress.  Cardiovascular: Normal rate,  regular rhythm and normal heart sounds.  Pulmonary/Chest: Effort normal and breath sounds normal.  Musculoskeletal: She exhibits no edema.  Neurological: She is alert.  Skin: Skin is warm and dry. She is not diaphoretic.     Assessment/Plan: Please see individual problem list.  Weight loss Mild weight loss over the last month.  I suspect her weight loss is related to the stress and anxiety regarding her husband.  I offered treatment first stress and anxiety though she noted if anything she would speak to her pastor regarding this.  She will keep her appointment with nephrology regarding her proteinuria though I did discuss it was likely related to her diabetes and hypertension.  We will recheck her in 2 months to evaluate for any further weight loss.  Stress Patient opted to monitor at this time.  If desired she will see her pastor.  Bruise of breast She will keep her follow-up mammogram and ultrasound appointment.  Proteinuria She will see nephrology as discussed.  AKI (acute kidney injury) ([place]) Undetermined cause.  Discussed adequate hydration.  She will avoid NSAIDs.  She will see nephrology as planned.  Discussed if they determined a structural issue she may need to see urology.   No orders of the defined types were placed in this encounter.   No orders of the defined types were placed in this encounter.    Randall Hiss  Caryl Bis, MD [place]

## 2018-07-05 NOTE — Assessment & Plan Note (Signed)
Mild weight loss over the last month.  I suspect her weight loss is related to the stress and anxiety regarding her husband.  I offered treatment first stress and anxiety though she noted if anything she would speak to her pastor regarding this.  She will keep her appointment with nephrology regarding her proteinuria though I did discuss it was likely related to her diabetes and hypertension.  We will recheck her in 2 months to evaluate for any further weight loss.

## 2018-07-05 NOTE — Assessment & Plan Note (Signed)
Patient opted to monitor at this time.  If desired she will see her pastor.

## 2018-07-05 NOTE — Assessment & Plan Note (Signed)
She will keep her follow-up mammogram and ultrasound appointment.

## 2018-07-05 NOTE — Assessment & Plan Note (Signed)
She will see nephrology as discussed.

## 2018-07-05 NOTE — Patient Instructions (Signed)
Nice to see you. Please consider speaking with your pastor regarding your stress. Please follow-up with nephrology as planned.

## 2018-07-19 ENCOUNTER — Other Ambulatory Visit: Payer: Self-pay | Admitting: Family Medicine

## 2018-08-09 ENCOUNTER — Other Ambulatory Visit (HOSPITAL_COMMUNITY): Payer: Self-pay | Admitting: Nephrology

## 2018-08-09 ENCOUNTER — Other Ambulatory Visit: Payer: Self-pay | Admitting: Nephrology

## 2018-08-09 DIAGNOSIS — N183 Chronic kidney disease, stage 3 unspecified: Secondary | ICD-10-CM

## 2018-08-16 ENCOUNTER — Ambulatory Visit
Admission: RE | Admit: 2018-08-16 | Discharge: 2018-08-16 | Disposition: A | Discharge status: Home / Self Care | Payer: Medicare Other | Source: Ambulatory Visit | Attending: Nephrology | Admitting: Nephrology

## 2018-08-16 DIAGNOSIS — N183 Chronic kidney disease, stage 3 unspecified: Secondary | ICD-10-CM

## 2018-09-21 ENCOUNTER — Ambulatory Visit: Payer: Medicare Other | Admitting: Family Medicine

## 2018-09-21 ENCOUNTER — Encounter: Payer: Self-pay | Admitting: Family Medicine

## 2018-09-21 VITALS — BP 104/66 | HR 74 | Temp 98.0°F | Wt 162.8 lb

## 2018-09-21 DIAGNOSIS — R634 Abnormal weight loss: Secondary | ICD-10-CM

## 2018-09-21 DIAGNOSIS — E119 Type 2 diabetes mellitus without complications: Secondary | ICD-10-CM | POA: Diagnosis not present

## 2018-09-21 DIAGNOSIS — F439 Reaction to severe stress, unspecified: Secondary | ICD-10-CM

## 2018-09-21 DIAGNOSIS — E785 Hyperlipidemia, unspecified: Secondary | ICD-10-CM | POA: Diagnosis not present

## 2018-09-21 DIAGNOSIS — S2000XA Contusion of breast, unspecified breast, initial encounter: Secondary | ICD-10-CM

## 2018-09-21 DIAGNOSIS — N6489 Other specified disorders of breast: Secondary | ICD-10-CM | POA: Diagnosis not present

## 2018-09-21 DIAGNOSIS — R809 Proteinuria, unspecified: Secondary | ICD-10-CM

## 2018-09-21 LAB — LIPID PANEL
CHOL/HDL RATIO: 3
Cholesterol: 131 mg/dL (ref 0–200)
HDL: 44.4 mg/dL (ref >39.00)
LDL Cholesterol: 54 mg/dL (ref 0–99)
NonHDL: 86.78
TRIGLYCERIDES: 163 mg/dL — AB (ref 0.0–149.0)
VLDL: 32.6 mg/dL (ref 0.0–40.0)

## 2018-09-21 LAB — HEMOGLOBIN A1C: Hgb A1c MFr Bld: 6.5 % (ref 4.6–6.5)

## 2018-09-21 NOTE — Assessment & Plan Note (Signed)
She will keep her appointment for follow-up mammogram next week.

## 2018-09-21 NOTE — Patient Instructions (Signed)
Nice to see you. We will get lab work today. Please make sure you keep your appointment for your mammogram. Please try to exercise some and monitor your diet.  Please monitor your stress level and anxiety.  If it worsens please let us know.

## 2018-09-21 NOTE — Assessment & Plan Note (Signed)
Negative evaluation through nephrology per patient report.  Suspect related to diabetes.  She will continue to see nephrology.

## 2018-09-21 NOTE — Assessment & Plan Note (Signed)
Weight has trended up.  Suspect this was related to stress.  She will monitor.

## 2018-09-21 NOTE — Progress Notes (Signed)
  [name], MD Phone: [phone]  Amber Oliver is a 68 y.o. female who presents today for follow-up.  CC: Stress, proteinuria, diabetes, abnormal mammogram  Stress: Patient notes this is better than it was.  Her anxiety is not quite as bad.  No depression.  Appetite is better than it was.  Some days she just does not want to eat and other days she does well.  Typically has leftovers for lunch.  Waffle for breakfast.  A variety of things for dinner.  Her weight is up 1 pound.  She is been exercising some with walking.  Proteinuria: She saw nephrology.  She had negative work-up.  She states they advised her it was likely related to the diabetes and placed her on losartan.  Diabetes: Typically running around 116 at the highest.  Taking metformin.  No polyuria or polydipsia.  No hypoglycemia.  Abnormal mammogram: She is due for recheck and has an appointment scheduled for this next Wednesday.  Social History   Tobacco Use  Smoking Status Former Smoker  . Packs/day: 0.25  . Years: 20.00  . Pack years: 5.00  . Types: Cigarettes  . Last attempt to quit: 05/09/1999  . Years since quitting: 19.3  Smokeless Tobacco Never Used     ROS see history of present illness  Objective  Physical Exam Vitals:   09/21/18 1359  BP: 104/66  Pulse: 74  Temp: 98 F (36.7 C)  SpO2: 96%    BP Readings from Last 3 Encounters:  09/21/18 104/66  07/05/18 106/80  06/02/18 122/80   Wt Readings from Last 3 Encounters:  09/21/18 162 lb 12.8 oz (73.8 kg)  07/05/18 161 lb 9.6 oz (73.3 kg)  06/02/18 163 lb (73.9 kg)    Physical Exam Constitutional:      General: She is not in acute distress.    Appearance: She is not diaphoretic.  Cardiovascular:     Rate and Rhythm: Normal rate and regular rhythm.     Heart sounds: Normal heart sounds.  Pulmonary:     Effort: Pulmonary effort is normal.     Breath sounds: Normal breath sounds.  Musculoskeletal:     Right lower leg: No edema.    Left lower leg: No edema.  Skin:    General: Skin is warm and dry.  Neurological:     Mental Status: She is alert.      Assessment/Plan: Please see individual problem list.  Weight loss Weight has trended up.  Suspect this was related to stress.  She will monitor.  Stress Improving.  She will continue to monitor.  Bruise of breast She will keep her appointment for follow-up mammogram next week.  Proteinuria Negative evaluation through nephrology per patient report.  Suspect related to diabetes.  She will continue to see nephrology.   Orders Placed This Encounter  Procedures  . Lipid panel  . HgB A1c    No orders of the defined types were placed in this encounter.    [name], MD [place] [employer]

## 2018-09-21 NOTE — Assessment & Plan Note (Signed)
Improving.  She will continue to monitor.

## 2018-09-24 ENCOUNTER — Ambulatory Visit: Payer: Medicare Other | Admitting: Family Medicine

## 2018-11-06 ENCOUNTER — Other Ambulatory Visit: Payer: Self-pay | Admitting: Family Medicine

## 2019-01-17 ENCOUNTER — Encounter: Payer: Self-pay | Admitting: Family Medicine

## 2019-01-17 ENCOUNTER — Ambulatory Visit (INDEPENDENT_AMBULATORY_CARE_PROVIDER_SITE_OTHER): Payer: Medicare Other | Admitting: Internal Medicine

## 2019-01-17 ENCOUNTER — Encounter: Payer: Self-pay | Admitting: Internal Medicine

## 2019-01-17 DIAGNOSIS — J4520 Mild intermittent asthma, uncomplicated: Secondary | ICD-10-CM

## 2019-01-17 DIAGNOSIS — R928 Other abnormal and inconclusive findings on diagnostic imaging of breast: Secondary | ICD-10-CM

## 2019-01-17 NOTE — Progress Notes (Signed)
MRN# [id number] Amber Oliver November 30, 1950   Brief History: Synopsis: 68 year old past medical history of hyperlipidemia diabetes presented to [facility] 09/2014 evaluation of chronic cough. Diagnosed bronchitis December 2015, chronic cough since then, usually has one cold per year treated with supportive care. PFTs with mild restriction TLC 67, RV 48, FEV1 82 MVC 87 FEV1/FVC 72, ERV 35, DLCO 114. Placed on Qvar trial with rapid improvement. Trial off lisinopril for 5 days, no improvement. Negative CT Chest. Doing well on ICS.   VIDEO/TELEPHONE VISIT    In the setting of the current Covid19 crisis, you are scheduled for a  visit with me on 01/17/2019  Just as we do with many in-office visits, in order for you to participate in this visit, we must obtain consent.   I can obtain your verbal consent now.  PATIENT AGREES AND CONFIRMS -YES This Visit has Audio and Visual Capabilities for optimal patient care experience   Evaluation Performed:  Follow-up visit  This visit type was conducted due to national recommendations for restrictions regarding the COVID-19 Pandemic (e.g. social distancing).  This format is felt to be most appropriate for this patient at this time.  All issues noted in this document were discussed and addressed.     Virtual Visit via Telephone Note     I connected with patient on 01/17/2019  by telephone and verified that I am speaking with the correct person using two identifiers.   I discussed the limitations, risks, security and privacy concerns of performing an evaluation and management service by telephone and the availability of in person appointments. I also discussed with the patient that there may be a patient responsible charge related to this service. The patient expressed understanding and agreed to proceed.   Location of the patient: Home Location of provider: office Participating persons: Patient and provider only     CC: Follow up  ASTHMA     HPI  On Qvar 80 onceper day Very well controlled  No signs of infection No exacerbation at this time  Perfumes are triggers Previous Smoker Quit 2000 1 pack per week for 10 years  No need for ABX and steroids  Medication:    Current Outpatient Medications:  .  ACCU-CHEK AVIVA PLUS test strip, USE AS DIRECTED TO TEST BLOOD SUGAR 2 TIMES A DAY WITH ACCU CHEK MONITOR, Disp: 100 each, Rfl: 3 .  losartan (COZAAR) 25 MG tablet, , Disp: , Rfl:  .  metFORMIN (GLUCOPHAGE) 500 MG tablet, Take 1 tablet (500 mg total) by mouth 2 (two) times daily with a meal., Disp: 180 tablet, Rfl: 3 .  omeprazole (PRILOSEC) 40 MG capsule, Take 40 mg by mouth daily as needed. , Disp: , Rfl:  .  QVAR REDIHALER 80 MCG/ACT inhaler, INHALE 1 PUFF TWICE DAILY, Disp: 1 Inhaler, Rfl: 5 .  rosuvastatin (CRESTOR) 40 MG tablet, Take 1 tablet by mouth once daily, Disp: 90 tablet, Rfl: 0     Review of Systems:  Gen:  Denies  fever, sweats, chills weigh loss  HEENT: Denies blurred vision, double vision, ear pain, eye pain, hearing loss, nose bleeds, sore throat Cardiac:  No dizziness, chest pain or heaviness, chest tightness,edema, No JVD Resp:   No cough, -sputum production, -shortness of breath,-wheezing, -hemoptysis,  Gi: Denies swallowing difficulty, stomach pain, nausea or vomiting, diarrhea, constipation, bowel incontinence Gu:  Denies bladder incontinence, burning urine Ext:   Denies Joint pain, stiffness or swelling Skin: Denies  skin rash, easy bruising or bleeding or hives  Endoc:  Denies polyuria, polydipsia , polyphagia or weight change Psych:   Denies depression, insomnia or hallucinations  Other:  All other systems negative     Allergies:  Ciprofloxacin; Sulfa drugs cross reactors; and Sulfa antibiotics       A/P 68 year old pleasant white female for follow-up mild intermittent asthma Patient is very well controlled on Qvar  Extrinsic asthma Improvement on inhaled  steroid Now on maintenance therapy  No evidence of infection at this time No evidence of exacerbation at this time Plan is to continue Qvar 80 mcg 1 puff daily Avoid triggers and smoke   Asthma education provided   TOTAL TIME SPENT 23 MINUTES   COVID-19 EDUCATION: The signs and symptoms of COVID-19 were discussed with the patient and how to seek care for testing.  The importance of social distancing was discussed today. Hand Washing Techniques and avoid touching face was advised.  MEDICATION ADJUSTMENTS/LABS AND TESTS ORDERED: No new tests CURRENT MEDICATIONS REVIEWED AT LENGTH WITH PATIENT TODAY   Patient  satisfied with Plan of action and management. All questions answered Follow up in 1 year   Rudolf Blizard Patricia Pesa, M.D.  [facility]  Medical Director [place] Director [facility] Cardio-Pulmonary Department

## 2019-01-17 NOTE — Patient Instructions (Signed)
Continue inhalers as prescribed Asthma education provided   Asthma, Adult  Asthma is a long-term (chronic) condition in which the airways get tight and narrow. The airways are the breathing passages that lead from the nose and mouth down into the lungs. A person with asthma will have times when symptoms get worse. These are called asthma attacks. They can cause coughing, whistling sounds when you breathe (wheezing), shortness of breath, and chest pain. They can make it hard to breathe. There is no cure for asthma, but medicines and lifestyle changes can help control it. There are many things that can bring on an asthma attack or make asthma symptoms worse (triggers). Common triggers include:  Mold.  Dust.  Cigarette smoke.  Cockroaches.  Things that can cause allergy symptoms (allergens). These include animal skin flakes (dander) and pollen from trees or grass.  Things that pollute the air. These may include household cleaners, wood smoke, smog, or chemical odors.  Cold air, weather changes, and wind.  Crying or laughing hard.  Stress.  Certain medicines or drugs.  Certain foods such as dried fruit, potato chips, and grape juice.  Infections, such as a cold or the flu.  Certain medical conditions or diseases.  Exercise or tiring activities. Asthma may be treated with medicines and by staying away from the things that cause asthma attacks. Types of medicines may include:  Controller medicines. These help prevent asthma symptoms. They are usually taken every day.  Fast-acting reliever or rescue medicines. These quickly relieve asthma symptoms. They are used as needed and provide short-term relief.  Allergy medicines if your attacks are brought on by allergens.  Medicines to help control the body's defense (immune) system. Follow these instructions at home: Avoiding triggers in your home  Change your heating and air conditioning filter often.  Limit your use of  fireplaces and wood stoves.  Get rid of pests (such as roaches and mice) and their droppings.  Throw away plants if you see mold on them.  Clean your floors. Dust regularly. Use cleaning products that do not smell.  Have someone vacuum when you are not home. Use a vacuum cleaner with a HEPA filter if possible.  Replace carpet with wood, tile, or vinyl flooring. Carpet can trap animal skin flakes and dust.  Use allergy-proof pillows, mattress covers, and box spring covers.  Wash bed sheets and blankets every week in hot water. Dry them in a dryer.  Keep your bedroom free of any triggers.  Avoid pets and keep windows closed when things that cause allergy symptoms are in the air.  Use blankets that are made of polyester or cotton.  Clean bathrooms and kitchens with bleach. If possible, have someone repaint the walls in these rooms with mold-resistant paint. Keep out of the rooms that are being cleaned and painted.  Wash your hands often with soap and water. If soap and water are not available, use hand sanitizer.  Do not allow anyone to smoke in your home. General instructions  Take over-the-counter and prescription medicines only as told by your doctor. ? Talk with your doctor if you have questions about how or when to take your medicines. ? Make note if you need to use your medicines more often than usual.  Do not use any products that contain nicotine or tobacco, such as cigarettes and e-cigarettes. If you need help quitting, ask your doctor.  Stay away from secondhand smoke.  Avoid doing things outdoors when allergen counts are high and when  air quality is low.  Wear a ski mask when doing outdoor activities in the winter. The mask should cover your nose and mouth. Exercise indoors on cold days if you can.  Warm up before you exercise. Take time to cool down after exercise.  Use a peak flow meter as told by your doctor. A peak flow meter is a tool that measures how well  the lungs are working.  Keep track of the peak flow meter's readings. Write them down.  Follow your asthma action plan. This is a written plan for taking care of your asthma and treating your attacks.  Make sure you get all the shots (vaccines) that your doctor recommends. Ask your doctor about a flu shot and a pneumonia shot.  Keep all follow-up visits as told by your doctor. This is important. Contact a doctor if:  You have wheezing, shortness of breath, or a cough even while taking medicine to prevent attacks.  The mucus you cough up (sputum) is thicker than usual.  The mucus you cough up changes from clear or white to yellow, green, gray, or bloody.  You have problems from the medicine you are taking, such as: ? A rash. ? Itching. ? Swelling. ? Trouble breathing.  You need reliever medicines more than 2-3 times a week.  Your peak flow reading is still at 50-79% of your personal best after following the action plan for 1 hour.  You have a fever. Get help right away if:  You seem to be worse and are not responding to medicine during an asthma attack.  You are short of breath even at rest.  You get short of breath when doing very little activity.  You have trouble eating, drinking, or talking.  You have chest pain or tightness.  You have a fast heartbeat.  Your lips or fingernails start to turn blue.  You are light-headed or dizzy, or you faint.  Your peak flow is less than 50% of your personal best.  You feel too tired to breathe normally. Summary  Asthma is a long-term (chronic) condition in which the airways get tight and narrow. An asthma attack can make it hard to breathe.  Asthma cannot be cured, but medicines and lifestyle changes can help control it.  Make sure you understand how to avoid triggers and how and when to use your medicines. This information is not intended to replace advice given to you by your health care provider. Make sure you discuss  any questions you have with your health care provider. Document Released: 01/21/2008 Document Revised: 09/08/2016 Document Reviewed: 09/08/2016 Elsevier Interactive Patient Education  2019 [employer].

## 2019-01-21 ENCOUNTER — Ambulatory Visit (INDEPENDENT_AMBULATORY_CARE_PROVIDER_SITE_OTHER): Payer: Medicare Other | Admitting: Family Medicine

## 2019-01-21 ENCOUNTER — Other Ambulatory Visit: Payer: Self-pay

## 2019-01-21 ENCOUNTER — Encounter: Payer: Self-pay | Admitting: Family Medicine

## 2019-01-21 DIAGNOSIS — N6489 Other specified disorders of breast: Secondary | ICD-10-CM

## 2019-01-21 DIAGNOSIS — I10 Essential (primary) hypertension: Secondary | ICD-10-CM | POA: Diagnosis not present

## 2019-01-21 DIAGNOSIS — E119 Type 2 diabetes mellitus without complications: Secondary | ICD-10-CM | POA: Diagnosis not present

## 2019-01-21 DIAGNOSIS — E785 Hyperlipidemia, unspecified: Secondary | ICD-10-CM | POA: Diagnosis not present

## 2019-01-21 DIAGNOSIS — S2000XA Contusion of breast, unspecified breast, initial encounter: Secondary | ICD-10-CM

## 2019-01-21 DIAGNOSIS — J4530 Mild persistent asthma, uncomplicated: Secondary | ICD-10-CM

## 2019-01-21 NOTE — Assessment & Plan Note (Signed)
Mammogram ordered and information faxed recently for this to be scheduled.

## 2019-01-21 NOTE — Progress Notes (Signed)
Virtual Visit via telephone Note  This visit type was conducted due to national recommendations for restrictions regarding the COVID-19 pandemic (e.g. social distancing).  This format is felt to be most appropriate for this patient at this time.  All issues noted in this document were discussed and addressed.  No physical exam was performed (except for noted visual exam findings with Video Visits).   I connected with Amber Oliver today at  1:45 PM EDT by telephone and verified that I am speaking with the correct person using two identifiers. Location patient: home Location provider: work Persons participating in the virtual visit: patient, provider  I discussed the limitations, risks, security and privacy concerns of performing an evaluation and management service by telephone and the availability of in person appointments. I also discussed with the patient that there may be a patient responsible charge related to this service. The patient expressed understanding and agreed to proceed.  Interactive audio and video telecommunications were attempted between this provider and patient, however failed, due to patient having technical difficulties OR patient did not have access to video capability.  We continued and completed visit with audio only.   Reason for visit: follow-up  HPI: HYPERTENSION  Disease Monitoring  Home BP Monitoring 136/80 on last check chest pain-no    dyspnea-no Medications  Compliance-taking losartan.  Edema-no  HYPERLIPIDEMIA Symptoms Chest pain on exertion: No    Medications: Compliance-taking Crestor right upper quadrant pain-no muscle aches-no  Asthma: Medication compliance-using Qvar  rescue inhaler use-no Dyspnea-no  wheezing-no  cough-only with inhalation of fumes from cleaning materials when she is at home.  Occasionally takes over-the-counter allergy medication.  Diabetes: No polyuria or polydipsia.  She notes she had recent lab work checked through  nephrology and notes her blood sugar was 114.  She is trying to walk some for exercise.  She avoids fried fatty foods and sugary foods.   ROS: See pertinent positives and negatives per HPI.  Past Medical History:  Diagnosis Date  . Diabetes mellitus   . Hyperlipidemia   . Hypertension   . Kidney stone     Past Surgical History:  Procedure Laterality Date  . TRIGGER FINGER RELEASE      Family History  Problem Relation Age of Onset  . Heart disease Mother   . Heart failure Mother   . Diabetes Father   . Lung cancer Father   . Diabetes Sister   . Diabetes Brother   . Cancer Maternal Grandmother        breast    SOCIAL HX: Former smoker.   Current Outpatient Medications:  .  ACCU-CHEK AVIVA PLUS test strip, USE AS DIRECTED TO TEST BLOOD SUGAR 2 TIMES A DAY WITH ACCU CHEK MONITOR, Disp: 100 each, Rfl: 3 .  losartan (COZAAR) 25 MG tablet, , Disp: , Rfl:  .  metFORMIN (GLUCOPHAGE) 500 MG tablet, Take 1 tablet (500 mg total) by mouth 2 (two) times daily with a meal., Disp: 180 tablet, Rfl: 3 .  omeprazole (PRILOSEC) 40 MG capsule, Take 40 mg by mouth daily as needed. , Disp: , Rfl:  .  QVAR REDIHALER 80 MCG/ACT inhaler, INHALE 1 PUFF TWICE DAILY, Disp: 1 Inhaler, Rfl: 5 .  rosuvastatin (CRESTOR) 40 MG tablet, Take 1 tablet by mouth once daily, Disp: 90 tablet, Rfl: 0  EXAM: This is a telehealth telephone visit and thus no physical exam was completed.  ASSESSMENT AND PLAN:  Discussed the following assessment and plan:  Essential hypertension  Mild persistent extrinsic asthma without complication  Controlled type 2 diabetes mellitus without complication, without long-term current use of insulin (HCC)  Hyperlipidemia LDL goal <70  Bruise of breast  Hypertension Slightly above goal though she is now been checking consistently.  She will check her blood pressure once daily for the next week and send [place] her readings through my chart.  Continue losartan.  Extrinsic  asthma Well-controlled.  Continue Qvar.  Diabetes type 2, controlled Recently well controlled.  We will plan to check an A1c at her next visit.  Hyperlipidemia LDL goal <70 Continue Crestor.  Bruise of breast Mammogram ordered and information faxed recently for this to be scheduled.  [place] office staff will contact the patient to get her scheduled for follow-up in about 4 months.  Social distancing precautions and sick precautions given regarding COVID-19.   I discussed the assessment and treatment plan with the patient. The patient was provided an opportunity to ask questions and all were answered. The patient agreed with the plan and demonstrated an understanding of the instructions.   The patient was advised to call back or seek an in-person evaluation if the symptoms worsen or if the condition fails to improve as anticipated.  I provided 16 minutes of non-face-to-face time during this encounter.   [name], MD

## 2019-01-21 NOTE — Assessment & Plan Note (Signed)
Slightly above goal though she is now been checking consistently.  She will check her blood pressure once daily for the next week and send [place] her readings through my chart.  Continue losartan.

## 2019-01-21 NOTE — Assessment & Plan Note (Signed)
Well-controlled.  Continue Qvar.

## 2019-01-21 NOTE — Assessment & Plan Note (Signed)
Continue Crestor 

## 2019-01-21 NOTE — Assessment & Plan Note (Signed)
Recently well controlled.  We will plan to check an A1c at her next visit. ?

## 2019-02-01 ENCOUNTER — Encounter: Payer: Self-pay | Admitting: Family Medicine

## 2019-02-14 ENCOUNTER — Other Ambulatory Visit: Payer: Self-pay | Admitting: Family Medicine

## 2019-04-12 ENCOUNTER — Other Ambulatory Visit: Payer: Self-pay

## 2019-04-12 ENCOUNTER — Telehealth: Payer: Self-pay

## 2019-04-12 NOTE — Telephone Encounter (Signed)
Please confirm if she is taking the metformin 500 mg once daily or if she is taking 1000 mg once daily. Once you confirm you can send it in to her pharmacy. Thanks.

## 2019-04-15 LAB — HM DIABETES EYE EXAM

## 2019-04-15 NOTE — Telephone Encounter (Signed)
Left a message for the patient to call back to confirm metformin dose.  [place] for [employer] to advise.   Nina,cma

## 2019-05-14 ENCOUNTER — Other Ambulatory Visit: Payer: Self-pay | Admitting: Family Medicine

## 2019-05-17 DIAGNOSIS — R319 Hematuria, unspecified: Secondary | ICD-10-CM | POA: Insufficient documentation

## 2019-05-17 DIAGNOSIS — R809 Proteinuria, unspecified: Secondary | ICD-10-CM | POA: Insufficient documentation

## 2019-06-06 ENCOUNTER — Ambulatory Visit (INDEPENDENT_AMBULATORY_CARE_PROVIDER_SITE_OTHER): Payer: Medicare Other

## 2019-06-06 ENCOUNTER — Ambulatory Visit (INDEPENDENT_AMBULATORY_CARE_PROVIDER_SITE_OTHER): Payer: Medicare Other | Admitting: Family Medicine

## 2019-06-06 ENCOUNTER — Other Ambulatory Visit: Payer: Self-pay

## 2019-06-06 ENCOUNTER — Encounter: Payer: Self-pay | Admitting: Family Medicine

## 2019-06-06 DIAGNOSIS — Z0000 Encounter for general adult medical examination without abnormal findings: Secondary | ICD-10-CM | POA: Diagnosis not present

## 2019-06-06 DIAGNOSIS — E1121 Type 2 diabetes mellitus with diabetic nephropathy: Secondary | ICD-10-CM | POA: Diagnosis not present

## 2019-06-06 DIAGNOSIS — N182 Chronic kidney disease, stage 2 (mild): Secondary | ICD-10-CM

## 2019-06-06 DIAGNOSIS — I10 Essential (primary) hypertension: Secondary | ICD-10-CM | POA: Diagnosis not present

## 2019-06-06 DIAGNOSIS — J4530 Mild persistent asthma, uncomplicated: Secondary | ICD-10-CM | POA: Diagnosis not present

## 2019-06-06 NOTE — Assessment & Plan Note (Signed)
She will continue to see nephrology.  

## 2019-06-06 NOTE — Assessment & Plan Note (Signed)
Adequately controlled.  She will continue her current regimen. 

## 2019-06-06 NOTE — Patient Instructions (Addendum)
  Ms. Cowling , Thank you for taking time to come for your Medicare Wellness Visit. I appreciate your ongoing commitment to your health goals. Please review the following plan we discussed and let me know if I can assist you in the future.   These are the goals we discussed: Goals      Patient Stated   . Increase physical activity (pt-stated)     Stay active with her grandchild Healthy diet Maintain weight       This is a list of the screening recommended for you and due dates:  Health Maintenance  Topic Date Due  . Complete foot exam   06/03/2019  . Flu Shot  11/16/2019*  . Hemoglobin A1C  11/17/2019  . Eye exam for diabetics  04/14/2020  . Mammogram  06/15/2020  . Tetanus Vaccine  12/23/2022  . Colon Cancer Screening  08/22/2025  . DEXA scan (bone density measurement)  Completed  .  Hepatitis C: One time screening is recommended by Center for Disease Control  (CDC) for  adults born from [birth year] through 1966.   Completed  . Pneumonia vaccines  Completed  *Topic was postponed. The date shown is not the original due date.

## 2019-06-06 NOTE — Assessment & Plan Note (Signed)
Well-controlled.  Continue Qvar. 

## 2019-06-06 NOTE — Progress Notes (Signed)
Virtual Visit via telephone Note  This visit type was conducted due to national recommendations for restrictions regarding the COVID-19 pandemic (e.g. social distancing).  This format is felt to be most appropriate for this patient at this time.  All issues noted in this document were discussed and addressed.  No physical exam was performed (except for noted visual exam findings with Video Visits).   I connected with Amber Oliver today at  9:00 AM EDT by telephone and verified that I am speaking with the correct person using two identifiers. Location patient: home Location provider: work Persons participating in the virtual visit: patient, provider  I discussed the limitations, risks, security and privacy concerns of performing an evaluation and management service by telephone and the availability of in person appointments. I also discussed with the patient that there may be a patient responsible charge related to this service. The patient expressed understanding and agreed to proceed.  Interactive audio and video telecommunications were attempted between this provider and patient, however failed, due to patient having technical difficulties OR patient did not have access to video capability.  We continued and completed visit with audio only.   Reason for visit: follow-up  HPI: HYPERTENSION  Disease Monitoring  Home BP Monitoring typically 120/80, highest has been 135/83 Chest pain- no    Dyspnea- no Medications  Compliance-  Taking losartan.   Edema- no  DIABETES Disease Monitoring: Blood Sugar ranges-highest has been 115 Polyuria/phagia/dipsia- no      Optho- UTD Medications: Compliance- taking metformin 500 mg once daily Hypoglycemic symptoms- no  Asthma: using qvar daily. Rare cough. No wheezing or SOB. No night time symptoms. No recent albuterol use.   Proteinuria/CKD stage II: patient follows with Dr Holley Raring. Recent visit with him went well. He collected an A1c which was  6.2. GFR 64.       ROS: See pertinent positives and negatives per HPI.  Past Medical History:  Diagnosis Date  . Diabetes mellitus   . Hyperlipidemia   . Hypertension   . Kidney stone     Past Surgical History:  Procedure Laterality Date  . TRIGGER FINGER RELEASE      Family History  Problem Relation Age of Onset  . Heart disease Mother   . Heart failure Mother   . Diabetes Father   . Lung cancer Father   . Diabetes Sister   . Diabetes Brother   . Cancer Maternal Grandmother        breast    SOCIAL HX: former smoker   Current Outpatient Medications:  .  ACCU-CHEK AVIVA PLUS test strip, USE AS DIRECTED TO TEST BLOOD SUGAR 2 TIMES A DAY WITH ACCU CHEK MONITOR, Disp: 100 each, Rfl: 3 .  Cholecalciferol (VITAMIN D3) 25 MCG (1000 UT) CAPS, Vitamin D3 25 mcg (1,000 unit) capsule   1 capsule every day by oral route., Disp: , Rfl:  .  losartan (COZAAR) 25 MG tablet, , Disp: , Rfl:  .  metFORMIN (GLUCOPHAGE) 500 MG tablet, Take 1 tablet (500 mg total) by mouth 2 (two) times daily with a meal., Disp: 180 tablet, Rfl: 3 .  omeprazole (PRILOSEC) 40 MG capsule, Take 40 mg by mouth daily as needed. , Disp: , Rfl:  .  QVAR REDIHALER 80 MCG/ACT inhaler, INHALE 1 PUFF TWICE DAILY, Disp: 1 Inhaler, Rfl: 5 .  rosuvastatin (CRESTOR) 40 MG tablet, Take 1 tablet by mouth once daily, Disp: 90 tablet, Rfl: 0 .  Zoster Vaccine Live, PF, ([place]) 09811  UNT/0.65ML injection, Zostavax (PF) 19,400 unit/0.65 mL subcutaneous suspension  DILUTE AND INJECT 0.65ML UNDER THE SKIN UTD, Disp: , Rfl:   EXAM: This was a telehealth telephone visit and thus no physical exam was completed.  ASSESSMENT AND PLAN:  Discussed the following assessment and plan:  Hypertension Adequately controlled.  She will continue her current regimen.  Type 2 diabetes with nephropathy ([place]) Well-controlled.  Continue Metformin.  She will continue to see nephrology.  Extrinsic asthma Well-controlled.  Continue  Qvar.  CKD (chronic kidney disease), stage II She will continue to see nephrology.    I discussed the assessment and treatment plan with the patient. The patient was provided an opportunity to ask questions and all were answered. The patient agreed with the plan and demonstrated an understanding of the instructions.   The patient was advised to call back or seek an in-person evaluation if the symptoms worsen or if the condition fails to improve as anticipated.  I provided 11 minutes of non-face-to-face time during this encounter.   [name], MD

## 2019-06-06 NOTE — Assessment & Plan Note (Addendum)
Well-controlled.  Continue Metformin.  She will continue to see nephrology.

## 2019-06-06 NOTE — Progress Notes (Addendum)
Subjective:   Amber Oliver is a 68 y.o. female who presents for Medicare Annual (Subsequent) preventive examination.  Review of Systems:  No ROS.  Medicare Wellness Virtual Visit.  Visual/audio telehealth visit, UTA vital signs.   See social history for additional risk factors.   Cardiac Risk Factors include: advanced age (>77men, >78 women);hypertension;diabetes mellitus     Objective:     Vitals: There were no vitals taken for this visit.  There is no height or weight on file to calculate BMI.  Advanced Directives 06/06/2019 06/02/2018 05/27/2017  Does Patient Have a Medical Advance Directive? No No No  Does patient want to make changes to medical advance directive? - - Yes (MAU/Ambulatory/Procedural Areas - Information given)  Would patient like information on creating a medical advance directive? No - Patient declined No - Patient declined -    Tobacco Social History   Tobacco Use  Smoking Status Former Smoker  . Packs/day: 0.25  . Years: 20.00  . Pack years: 5.00  . Types: Cigarettes  . Quit date: 05/09/1999  . Years since quitting: 20.0  Smokeless Tobacco Never Used     Counseling given: Not Answered   Clinical Intake:  Pre-visit preparation completed: Yes        Diabetes: Yes(Followed by [employer])  How often do you need to have someone help you when you read instructions, pamphlets, or other written materials from your doctor or pharmacy?: 1 - Never  Interpreter Needed?: No     Past Medical History:  Diagnosis Date  . Diabetes mellitus   . Hyperlipidemia   . Hypertension   . Kidney stone    Past Surgical History:  Procedure Laterality Date  . TRIGGER FINGER RELEASE     Family History  Problem Relation Age of Onset  . Heart disease Mother   . Heart failure Mother   . Diabetes Father   . Lung cancer Father   . Diabetes Sister   . Diabetes Brother   . Cancer Maternal Grandmother        breast   Social History    Socioeconomic History  . Marital status: Married    Spouse name: Not on file  . Number of children: Not on file  . Years of education: Not on file  . Highest education level: Not on file  Occupational History  . Not on file  Social Needs  . Financial resource strain: Not hard at all  . Food insecurity    Worry: Never true    Inability: Never true  . Transportation needs    Medical: No    Non-medical: No  Tobacco Use  . Smoking status: Former Smoker    Packs/day: 0.25    Years: 20.00    Pack years: 5.00    Types: Cigarettes    Quit date: 05/09/1999    Years since quitting: 20.0  . Smokeless tobacco: Never Used  Substance and Sexual Activity  . Alcohol use: No    Alcohol/week: 0.0 standard drinks  . Drug use: No  . Sexual activity: Never  Lifestyle  . Physical activity    Days per week: Not on file    Minutes per session: Not on file  . Stress: Not at all  Relationships  . Social Herbalist on phone: Not on file    Gets together: Not on file    Attends religious service: Not on file    Active member of club or organization: Not on  file    Attends meetings of clubs or organizations: Not on file    Relationship status: Not on file  Other Topics Concern  . Not on file  Social History Narrative  . Not on file    Outpatient Encounter Medications as of 06/06/2019  Medication Sig  . ACCU-CHEK AVIVA PLUS test strip USE AS DIRECTED TO TEST BLOOD SUGAR 2 TIMES A DAY WITH ACCU CHEK MONITOR  . Cholecalciferol (VITAMIN D3) 25 MCG (1000 UT) CAPS Vitamin D3 25 mcg (1,000 unit) capsule   1 capsule every day by oral route.  Marland Kitchen losartan (COZAAR) 25 MG tablet   . metFORMIN (GLUCOPHAGE) 500 MG tablet Take 1 tablet (500 mg total) by mouth 2 (two) times daily with a meal.  . omeprazole (PRILOSEC) 40 MG capsule Take 40 mg by mouth daily as needed.   Marland Kitchen QVAR REDIHALER 80 MCG/ACT inhaler INHALE 1 PUFF TWICE DAILY  . rosuvastatin (CRESTOR) 40 MG tablet Take 1 tablet by mouth  once daily  . Zoster Vaccine Live, PF, (ZOSTAVAX) 57846 UNT/0.65ML injection Zostavax (PF) 19,400 unit/0.65 mL subcutaneous suspension  DILUTE AND INJECT 0.65ML UNDER THE SKIN UTD  . [DISCONTINUED] atorvastatin (LIPITOR) 20 MG tablet atorvastatin 20 mg tablet  . [DISCONTINUED] lisinopril (ZESTRIL) 5 MG tablet lisinopril 5 mg tablet   No facility-administered encounter medications on file as of 06/06/2019.     Activities of Daily Living In your present state of health, do you have any difficulty performing the following activities: 06/06/2019  Hearing? N  Vision? N  Difficulty concentrating or making decisions? N  Walking or climbing stairs? N  Dressing or bathing? N  Doing errands, shopping? N  Preparing Food and eating ? N  Using the Toilet? N  In the past six months, have you accidently leaked urine? N  Do you have problems with loss of bowel control? N  Managing your Medications? N  Managing your Finances? N  Housekeeping or managing your Housekeeping? N  Some recent data might be hidden    Patient Care Team: Leone Haven, MD as PCP - General (Family Medicine)    Assessment:   This is a routine wellness examination for Amber Oliver.  Nurse connected with patient 06/06/19 at  8:30 AM EDT by a phone enabled telemedicine application and verified that I am speaking with the correct person using two identifiers. Patient stated full name and DOB. Patient gave permission to continue with virtual visit. Patient's location was at home and Nurse's location was at [place] office.   Health Maintenance Due: -Influenza vaccine 2020- discussed; to be completed in season with doctor or local pharmacy.   -Mammogram- see care everywhere. Due 06/2019 -Hgb A1c- 05/19/19 (6.2) Update all pending maintenance due as appropriate.   See completed HM at the end of note.   Eye: Visual acuity not assessed. Virtual visit. Wears corrective lenses. Followed by their ophthalmologist every 12 months.   Retinopathy- none reported  Dental: Visits every 6 months.    Hearing: Demonstrates normal hearing during visit.  Safety:  Patient feels safe at home- yes Patient does have smoke detectors at home- yes Patient does wear sunscreen or protective clothing when in direct sunlight - yes Patient does wear seat belt when in a moving vehicle - yes Patient drives- yes Adequate lighting in walkways free from debris- yes Grab bars and handrails used as appropriate- yes Ambulates with no assistive device Cell phone or lifeline/life alert/medic alert on person when ambulating outside of the home- yes  Social: Alcohol intake - no  Smoking history- former   Smokers in home? none Illicit drug use? none  Depression: PHQ 2 &9 complete. See screening below. Denies irritability, anhedonia, sadness/tearfullness.     Falls: See screening below.    Medication: Taking as directed and without issues.   Covid-19: Precautions and sickness symptoms discussed. Wears mask, social distancing, hand hygiene as appropriate.   Activities of Daily Living Patient denies needing assistance with: household chores, feeding themselves, getting from bed to chair, getting to the toilet, bathing/showering, dressing, managing money, or preparing meals.   Memory: Patient is alert. Patient denies difficulty focusing or concentrating. Correctly identified the president of the [place], season and recall. Patient likes to read, play computer games, complete puzzles for brain stimulation.  BMI- discussed the importance of a healthy diet, water intake and the benefits of aerobic exercise.  Educational material provided.  Physical activity- walking, no routine  Diet: low carb Water: good intake  Other Providers Patient Care Team: Leone Haven, MD as PCP - General (Family Medicine)  Exercise Activities and Dietary recommendations Current Exercise Habits: Home exercise routine, Type of exercise: walking,  Intensity: Mild  Goals      Patient Stated   . Increase physical activity (pt-stated)     Stay active with her grandchild Healthy diet Maintain weight       Fall Risk Fall Risk  06/06/2019 06/02/2018 05/27/2017 02/24/2017 08/27/2016  Falls in the past year? 0 No No No No  Number falls in past yr: 0 - - - -   Timed Get Up and Go performed: no, virtual visit  Depression Screen PHQ 2/9 Scores 06/06/2019 06/02/2018 05/27/2017 02/24/2017  PHQ - 2 Score 0 0 0 0  PHQ- 9 Score - - 0 1     Cognitive Function MMSE - Mini Mental State Exam 05/27/2017  Orientation to time 5  Orientation to Place 5  Registration 3  Attention/ Calculation 5  Recall 3  Language- name 2 objects 2  Language- repeat 1  Language- follow 3 step command 3  Language- read & follow direction 1  Write a sentence 1  Copy design 1  Total score 30     6CIT Screen 06/06/2019 06/02/2018  What Year? 0 points 0 points  What month? 0 points 0 points  What time? 0 points 0 points  Count back from 20 0 points 0 points  Months in reverse 0 points 0 points  Repeat phrase 0 points 0 points  Total Score 0 0    Immunization History  Administered Date(s) Administered  . Influenza, High Dose Seasonal PF 05/21/2016, 05/27/2017, 06/02/2018  . Influenza,inj,Quad PF,6+ Mos 05/11/2015  . Pneumococcal Conjugate-13 05/11/2015  . Pneumococcal Polysaccharide-23 03/31/2013, 06/14/2018  . Tdap 12/22/2012  . Zoster 03/18/2013   Screening Tests Health Maintenance  Topic Date Due  . FOOT EXAM  06/03/2019  . INFLUENZA VACCINE  11/16/2019 (Originally 03/19/2019)  . HEMOGLOBIN A1C  11/17/2019  . OPHTHALMOLOGY EXAM  04/14/2020  . MAMMOGRAM  06/15/2020  . TETANUS/TDAP  12/23/2022  . COLONOSCOPY  08/22/2025  . DEXA SCAN  Completed  . Hepatitis C Screening  Completed  . PNA vac Low Risk Adult  Completed      Plan:   Keep all routine maintenance appointments.   Follow up with your doctor today at 9:00  Medicare  Attestation I have personally reviewed: The patient's medical and social history Their use of alcohol, tobacco or illicit drugs Their current medications and supplements  The patient's functional ability including ADLs,fall risks, home safety risks, cognitive, and hearing and visual impairment Diet and physical activities Evidence for depression   In addition, I have reviewed and discussed with patient certain preventive protocols, quality metrics, and best practice recommendations. A written personalized care plan for preventive services as well as general preventive health recommendations were provided to patient.     Varney Biles, LPN  [id number]

## 2019-06-07 NOTE — Progress Notes (Signed)
I have reviewed the above note and agree.  Satoria Dunlop, M.D.  

## 2019-07-18 ENCOUNTER — Other Ambulatory Visit: Payer: Self-pay | Admitting: Internal Medicine

## 2019-07-25 ENCOUNTER — Other Ambulatory Visit: Payer: Self-pay | Admitting: Family Medicine

## 2019-08-21 ENCOUNTER — Other Ambulatory Visit: Payer: Self-pay | Admitting: Family Medicine

## 2019-09-05 DIAGNOSIS — Z1231 Encounter for screening mammogram for malignant neoplasm of breast: Secondary | ICD-10-CM | POA: Diagnosis not present

## 2019-09-05 LAB — HM MAMMOGRAPHY

## 2019-09-22 DIAGNOSIS — I10 Essential (primary) hypertension: Secondary | ICD-10-CM | POA: Diagnosis not present

## 2019-09-22 DIAGNOSIS — N182 Chronic kidney disease, stage 2 (mild): Secondary | ICD-10-CM | POA: Diagnosis not present

## 2019-09-22 DIAGNOSIS — R809 Proteinuria, unspecified: Secondary | ICD-10-CM | POA: Diagnosis not present

## 2019-09-22 DIAGNOSIS — E1122 Type 2 diabetes mellitus with diabetic chronic kidney disease: Secondary | ICD-10-CM | POA: Diagnosis not present

## 2019-09-22 LAB — HEMOGLOBIN A1C: Hemoglobin A1C: 6.2

## 2019-10-11 ENCOUNTER — Ambulatory Visit: Payer: Medicare PPO | Attending: Internal Medicine

## 2019-10-11 DIAGNOSIS — Z20822 Contact with and (suspected) exposure to covid-19: Secondary | ICD-10-CM | POA: Diagnosis not present

## 2019-10-12 LAB — NOVEL CORONAVIRUS, NAA: SARS-CoV-2, NAA: NOT DETECTED

## 2019-10-15 ENCOUNTER — Other Ambulatory Visit: Payer: Self-pay | Admitting: Family Medicine

## 2019-11-08 DIAGNOSIS — L578 Other skin changes due to chronic exposure to nonionizing radiation: Secondary | ICD-10-CM | POA: Diagnosis not present

## 2019-11-08 DIAGNOSIS — Z86018 Personal history of other benign neoplasm: Secondary | ICD-10-CM | POA: Diagnosis not present

## 2019-11-08 DIAGNOSIS — L308 Other specified dermatitis: Secondary | ICD-10-CM | POA: Diagnosis not present

## 2019-11-17 ENCOUNTER — Other Ambulatory Visit: Payer: Self-pay | Admitting: Family Medicine

## 2019-11-26 ENCOUNTER — Other Ambulatory Visit: Payer: Self-pay | Admitting: Internal Medicine

## 2019-11-30 ENCOUNTER — Encounter: Payer: Self-pay | Admitting: Family Medicine

## 2019-11-30 MED ORDER — QVAR REDIHALER 80 MCG/ACT IN AERB: 2.0000 | INHALATION_SPRAY | Freq: Two times a day (BID) | RESPIRATORY_TRACT | 2 refills | Status: DC

## 2019-12-06 ENCOUNTER — Other Ambulatory Visit: Payer: Self-pay

## 2019-12-06 ENCOUNTER — Encounter: Payer: Self-pay | Admitting: Family Medicine

## 2019-12-06 ENCOUNTER — Ambulatory Visit: Payer: Medicare Other | Admitting: Family Medicine

## 2019-12-06 DIAGNOSIS — E1121 Type 2 diabetes mellitus with diabetic nephropathy: Secondary | ICD-10-CM | POA: Diagnosis not present

## 2019-12-06 DIAGNOSIS — E785 Hyperlipidemia, unspecified: Secondary | ICD-10-CM

## 2019-12-06 DIAGNOSIS — I10 Essential (primary) hypertension: Secondary | ICD-10-CM

## 2019-12-06 LAB — LIPID PANEL
Cholesterol: 130 mg/dL (ref 0–200)
HDL: 50.7 mg/dL (ref >39.00)
LDL Cholesterol: 49 mg/dL (ref 0–99)
NonHDL: 79.16
Total CHOL/HDL Ratio: 3
Triglycerides: 153 mg/dL — ABNORMAL HIGH (ref 0.0–149.0)
VLDL: 30.6 mg/dL (ref 0.0–40.0)

## 2019-12-06 NOTE — Assessment & Plan Note (Addendum)
Check lipid panel.  Continue Crestor.

## 2019-12-06 NOTE — Assessment & Plan Note (Signed)
Well-controlled.  Continue Metformin.  Labs will be completed with nephrology in 1 month.

## 2019-12-06 NOTE — Patient Instructions (Signed)
Nice to see you. We will check your lipids and contact you with the result.

## 2019-12-06 NOTE — Progress Notes (Signed)
  [name], MD Phone: [phone]  Amber Oliver is a 69 y.o. female who presents today for f/u.  HYPERTENSION Disease Monitoring: Blood pressure range-typically well controlled Chest pain- no      Dyspnea- no Medications: Compliance- taking losartan   Edema- no  DIABETES Disease Monitoring: Blood Sugar ranges-A1c 6.2 in February, nephrology checks this every 3 months Polyuria/phagia/dipsia- no      Optho- UTD Medications: Compliance- taking metformin Hypoglycemic symptoms- no  HYPERLIPIDEMIA Disease Monitoring: See symptoms for Hypertension Medications: Compliance- taking crestor Right upper quadrant pain- no  Muscle aches- no     Social History   Tobacco Use  Smoking Status Former Smoker  . Packs/day: 0.25  . Years: 20.00  . Pack years: 5.00  . Types: Cigarettes  . Quit date: 05/09/1999  . Years since quitting: 20.5  Smokeless Tobacco Never Used     ROS see history of present illness  Objective  Physical Exam Vitals:   12/06/19 0903  BP: 120/70  Pulse: 93  Temp: (!) 95.9 F (35.5 C)  SpO2: 98%    BP Readings from Last 3 Encounters:  12/06/19 120/70  09/21/18 104/66  07/05/18 106/80   Wt Readings from Last 3 Encounters:  12/06/19 164 lb 3.2 oz (74.5 kg)  06/06/19 159 lb (72.1 kg)  09/21/18 162 lb 12.8 oz (73.8 kg)    Physical Exam Constitutional:      General: She is not in acute distress.    Appearance: She is not diaphoretic.  Cardiovascular:     Rate and Rhythm: Normal rate and regular rhythm.     Heart sounds: Normal heart sounds.  Pulmonary:     Effort: Pulmonary effort is normal.     Breath sounds: Normal breath sounds.  Musculoskeletal:     Right lower leg: No edema.     Left lower leg: No edema.  Skin:    General: Skin is warm and dry.  Neurological:     Mental Status: She is alert.    Diabetic Foot Exam - Simple   Simple Foot Form Diabetic Foot exam was performed with the following findings: Yes 12/06/2019  9:16  AM  Visual Inspection No deformities, no ulcerations, no other skin breakdown bilaterally: Yes Sensation Testing Intact to touch and monofilament testing bilaterally: Yes Pulse Check Posterior Tibialis and Dorsalis pulse intact bilaterally: Yes Comments      Assessment/Plan: Please see individual problem list.  Hypertension Well-controlled.  Continue current regimen.  Labs followed by nephrology.  Type 2 diabetes with nephropathy ([place]) Well-controlled.  Continue Metformin.  Labs will be completed with nephrology in 1 month.  Hyperlipidemia LDL goal <70 Check lipid panel.  Continue Crestor.   Orders Placed This Encounter  Procedures  . Lipid panel    No orders of the defined types were placed in this encounter.   This visit occurred during the SARS-CoV-2 public health emergency.  Safety protocols were in place, including screening questions prior to the visit, additional usage of staff PPE, and extensive cleaning of exam room while observing appropriate contact time as indicated for disinfecting solutions.    [name], MD [place]

## 2019-12-06 NOTE — Assessment & Plan Note (Signed)
Well-controlled.  Continue current regimen.  Labs followed by nephrology.

## 2020-01-04 DIAGNOSIS — H902 Conductive hearing loss, unspecified: Secondary | ICD-10-CM | POA: Diagnosis not present

## 2020-01-04 DIAGNOSIS — H6123 Impacted cerumen, bilateral: Secondary | ICD-10-CM | POA: Diagnosis not present

## 2020-01-18 ENCOUNTER — Other Ambulatory Visit: Payer: Self-pay

## 2020-01-18 ENCOUNTER — Encounter: Payer: Self-pay | Admitting: Internal Medicine

## 2020-01-18 ENCOUNTER — Ambulatory Visit: Payer: Medicare PPO | Admitting: Internal Medicine

## 2020-01-18 VITALS — BP 116/70 | HR 72 | Temp 98.0°F | Ht 65.0 in | Wt 165.0 lb

## 2020-01-18 DIAGNOSIS — J4520 Mild intermittent asthma, uncomplicated: Secondary | ICD-10-CM

## 2020-01-18 NOTE — Progress Notes (Signed)
MRN# [id number] Deberah Strope [birth year]   Brief History: Synopsis: 69 year old past medical history of hyperlipidemia diabetes presented to [facility] 09/2014 evaluation of chronic cough. Diagnosed bronchitis December 2015, chronic cough since then, usually has one cold per year treated with supportive care. PFTs with mild restriction TLC 67, RV 48, FEV1 82 MVC 87 FEV1/FVC 72, ERV 35, DLCO 114. Placed on Qvar trial with rapid improvement. Trial off lisinopril for 5 days, no improvement. Negative CT Chest. Doing well on ICS.    CC Follow up ASTHMA  HPI  Patient currently on Qvar 80 once per day Seems to be well-controlled at this time  No exacerbation at this time No evidence of heart failure at this time No evidence or signs of infection at this time No respiratory distress No fevers, chills, nausea, vomiting, diarrhea No evidence of lower extremity edema No evidence hemoptysis  Perfumes are triggers Previous smoker quit 2000 1 pack/week for 10 years No indication for antibiotics or steroids at this time  Medication:    Current Outpatient Medications:  .  ACCU-CHEK AVIVA PLUS test strip, USE AS DIRECTED TO TEST BLOOD SUGAR 2 TIMES A DAY WITH ACCU CHEK MONITOR, Disp: 100 each, Rfl: 0 .  beclomethasone (QVAR REDIHALER) 80 MCG/ACT inhaler, Inhale 2 puffs into the lungs 2 (two) times daily., Disp: 10.6 g, Rfl: 2 .  Cholecalciferol (VITAMIN D3) 25 MCG (1000 UT) CAPS, Vitamin D3 25 mcg (1,000 unit) capsule   1 capsule every day by oral route., Disp: , Rfl:  .  losartan (COZAAR) 25 MG tablet, , Disp: , Rfl:  .  metFORMIN (GLUCOPHAGE) 500 MG tablet, TAKE 1 TABLET BY MOUTH TWICE DAILY WITH A MEAL, Disp: 180 tablet, Rfl: 0 .  omeprazole (PRILOSEC) 40 MG capsule, Take 40 mg by mouth daily as needed. , Disp: , Rfl:  .  rosuvastatin (CRESTOR) 40 MG tablet, Take 1 tablet by mouth once daily, Disp: 90 tablet, Rfl: 3 .  Zoster Vaccine Live, PF, (ZOSTAVAX) 13086 UNT/0.65ML injection,  Zostavax (PF) 19,400 unit/0.65 mL subcutaneous suspension  DILUTE AND INJECT 0.65ML UNDER THE SKIN UTD, Disp: , Rfl:      Review of Systems:  Gen:  Denies  fever, sweats, chills weight loss  HEENT: Denies blurred vision, double vision, ear pain, eye pain, hearing loss, nose bleeds, sore throat Cardiac:  No dizziness, chest pain or heaviness, chest tightness,edema, No JVD Resp:   No cough, -sputum production, -shortness of breath,-wheezing, -hemoptysis,  Gi: Denies swallowing difficulty, stomach pain, nausea or vomiting, diarrhea, constipation, bowel incontinence Gu:  Denies bladder incontinence, burning urine Ext:   Denies Joint pain, stiffness or swelling Skin: Denies  skin rash, easy bruising or bleeding or hives Endoc:  Denies polyuria, polydipsia , polyphagia or weight change Psych:   Denies depression, insomnia or hallucinations  Other:  All other systems negative   BP 116/70 (BP Location: Left Arm, Cuff Size: Normal)   Pulse 72   Temp 98 F (36.7 C) (Oral)   Ht 5\' 5"  (1.651 m)   Wt 165 lb (74.8 kg)   SpO2 98%   BMI 27.46 kg/m     Physical Examination:   General Appearance: No distress  Neuro:without focal findings,  speech normal,  HEENT: PERRLA, EOM intact.   Pulmonary: normal breath sounds, No wheezing.  CardiovascularNormal S1,S2.  No m/r/g.   Abdomen: Benign, Soft, non-tender. Renal:  No costovertebral tenderness  GU:  Not performed at this time. Endoc: No evident thyromegaly Skin:   warm, no  rashes, no ecchymosis  Extremities: normal, no cyanosis, clubbing. PSYCHIATRIC: Mood, affect within normal limits.   ALL OTHER ROS ARE NEGATIVE    Allergies:  Ciprofloxacin, Sulfa drugs cross reactors, and Sulfa antibiotics       A/P 69 year old pleasant white female seen today for follow-up mild intermittent asthma Patient seems to be well-controlled with inhaled corticosteroids with Qvar At this time I recommend stopping her Qvar therapy to assess her  respiratory status  Extrinsic asthma We will hold Qvar at this time to assess her respiratory status Advised to call us in 1 to 2 weeks to see how she is doing Avoid triggers Albuterol as needed  No evidence of infection at this time No evidence of exacerbation at this time     TOTAL TIME SPENT 23 MINUTES   COVID-19 EDUCATION: The signs and symptoms of COVID-19 were discussed with the patient and how to seek care for testing (follow up with PCP or arrange E-visit).  The importance of social distancing was discussed today.  MEDICATION ADJUSTMENTS/LABS AND TESTS ORDERED: Hold Qvar for a week and assess respiratory status CURRENT MEDICATIONS REVIEWED AT LENGTH WITH PATIENT TODAY   Patient/satisfied with Plan of action and management. All questions answered  Follow up 1 year  Corrin Parker, M.D.  [facility]  Medical Director [place] Director [facility]

## 2020-01-18 NOTE — Patient Instructions (Signed)
Patient is to stop Qvar at this time and to assess her breathing Avoid triggers Restart Qvar if her symptoms return

## 2020-01-24 ENCOUNTER — Other Ambulatory Visit: Payer: Self-pay | Admitting: Family Medicine

## 2020-01-26 DIAGNOSIS — N182 Chronic kidney disease, stage 2 (mild): Secondary | ICD-10-CM | POA: Diagnosis not present

## 2020-01-26 DIAGNOSIS — E1122 Type 2 diabetes mellitus with diabetic chronic kidney disease: Secondary | ICD-10-CM | POA: Diagnosis not present

## 2020-01-26 DIAGNOSIS — I10 Essential (primary) hypertension: Secondary | ICD-10-CM | POA: Diagnosis not present

## 2020-01-26 DIAGNOSIS — R809 Proteinuria, unspecified: Secondary | ICD-10-CM | POA: Diagnosis not present

## 2020-02-01 ENCOUNTER — Encounter: Payer: Self-pay | Admitting: Family Medicine

## 2020-02-01 DIAGNOSIS — E1122 Type 2 diabetes mellitus with diabetic chronic kidney disease: Secondary | ICD-10-CM | POA: Diagnosis not present

## 2020-02-01 DIAGNOSIS — N1831 Chronic kidney disease, stage 3a: Secondary | ICD-10-CM | POA: Diagnosis not present

## 2020-02-01 DIAGNOSIS — I10 Essential (primary) hypertension: Secondary | ICD-10-CM | POA: Diagnosis not present

## 2020-02-01 DIAGNOSIS — R809 Proteinuria, unspecified: Secondary | ICD-10-CM | POA: Diagnosis not present

## 2020-02-03 NOTE — Telephone Encounter (Signed)
Routing to Dr. Kasa as an FYI ?

## 2020-02-07 ENCOUNTER — Telehealth: Payer: Self-pay

## 2020-02-07 ENCOUNTER — Other Ambulatory Visit: Payer: Self-pay

## 2020-02-07 DIAGNOSIS — E1121 Type 2 diabetes mellitus with diabetic nephropathy: Secondary | ICD-10-CM

## 2020-02-07 IMAGING — US US RENAL
1 series · 13 of 25 positions shown · non-contrast
Comparison: Noncontrast CT Abdomen and Pelvis 04/27/2012.

CLINICAL DATA: 67-year-old female with stage 3 chronic kidney
disease.

EXAM:
RENAL / URINARY TRACT ULTRASOUND COMPLETE

[Series 1: us renal · 0.23mm/px · 13 of 61 slices shown]
[im 1/61]
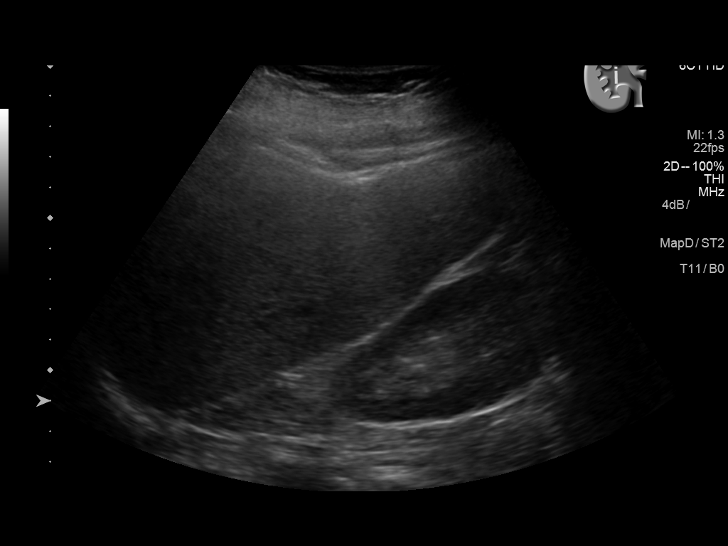
[im 6/61]
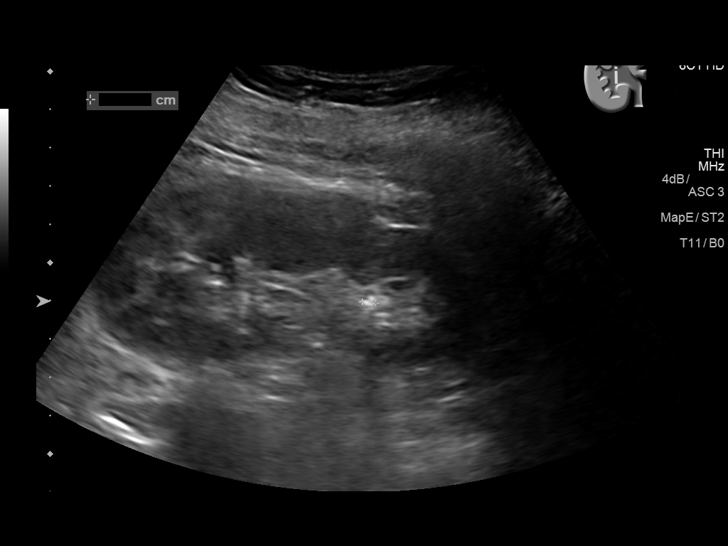
[im 11/61]
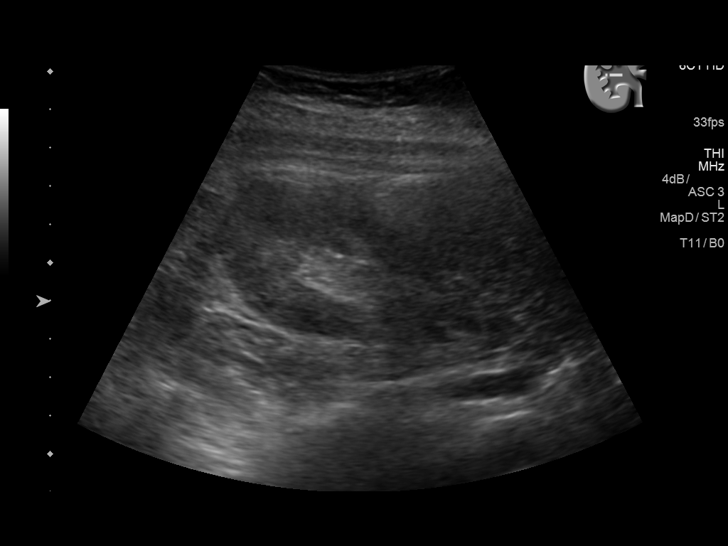
[im 16/61]
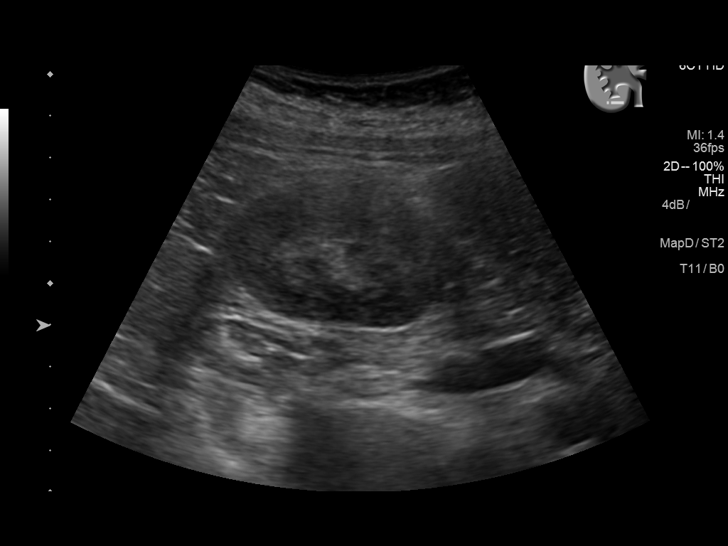
[im 21/61]
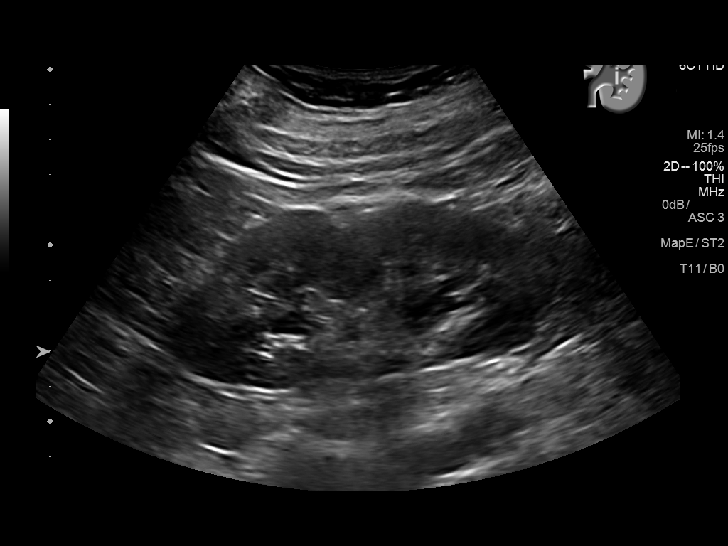
[im 26/61]
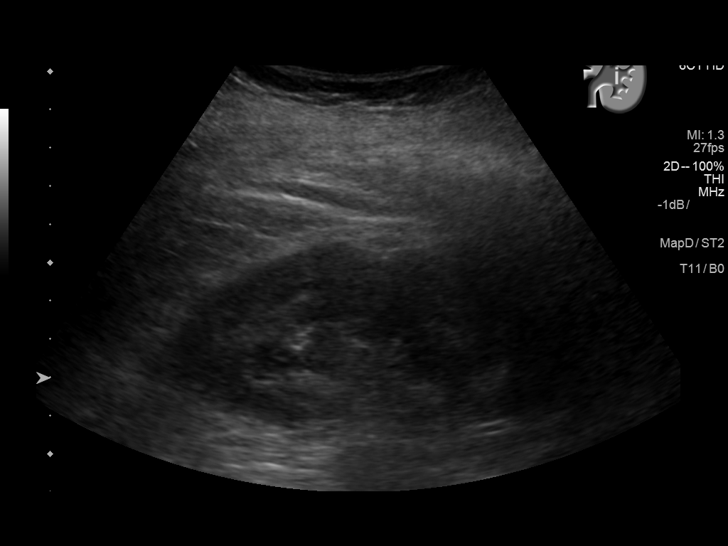
[im 31/61]
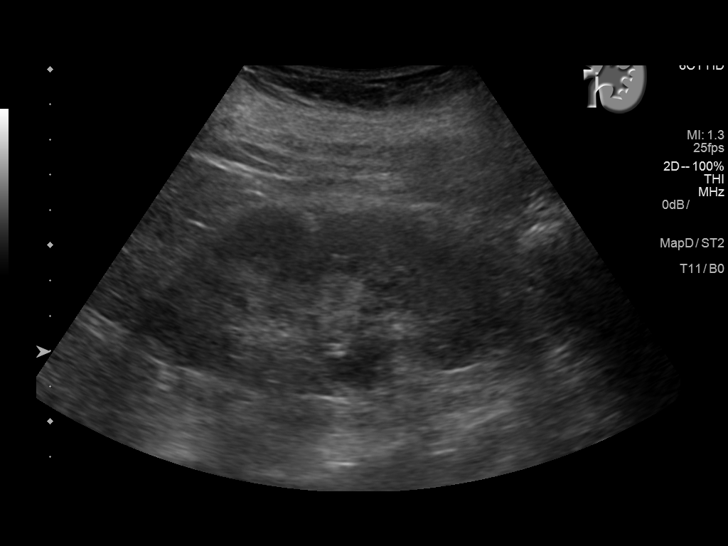
[im 36/61]
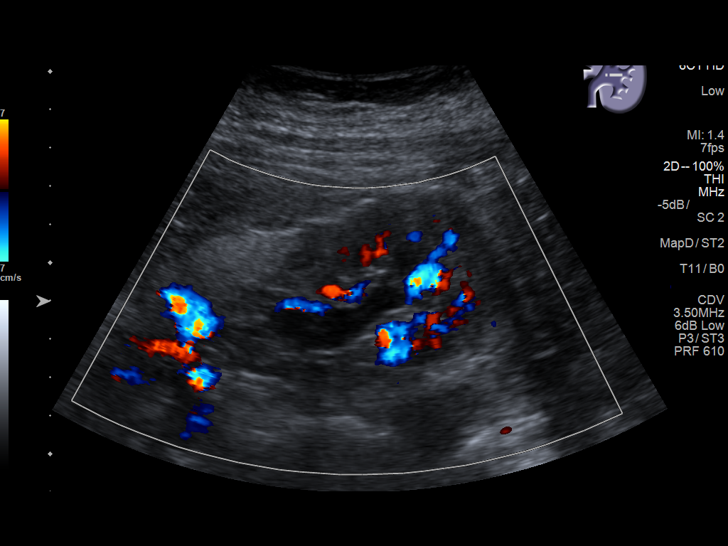
[im 41/61]
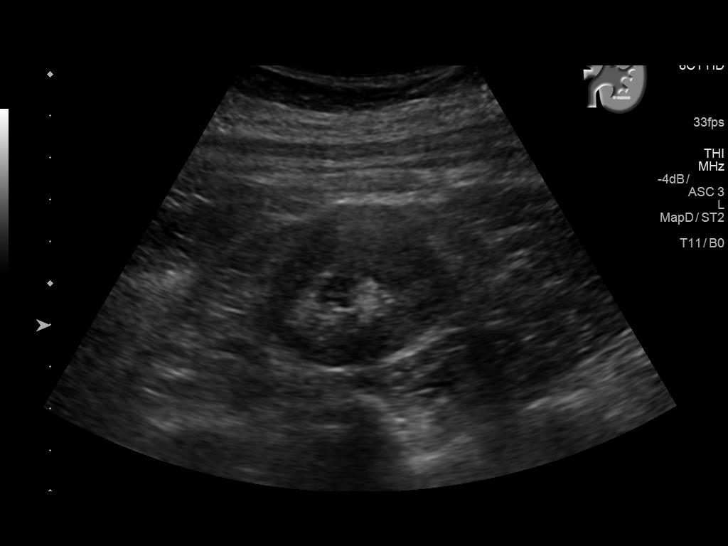
[im 46/61]
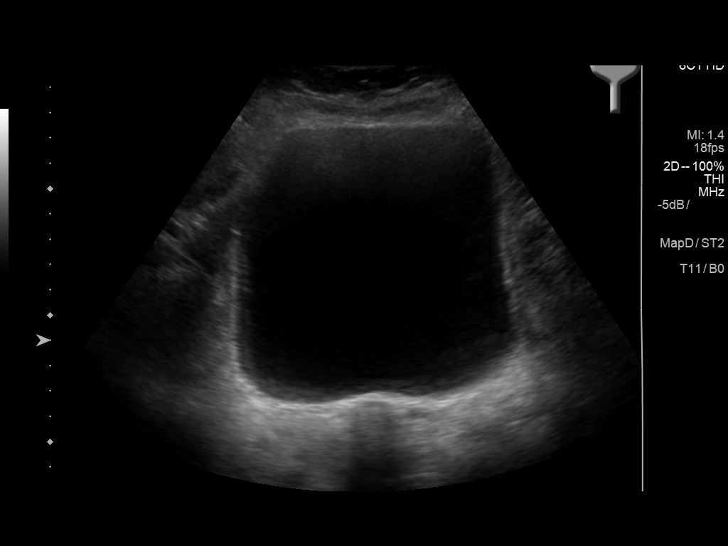
[im 51/61]
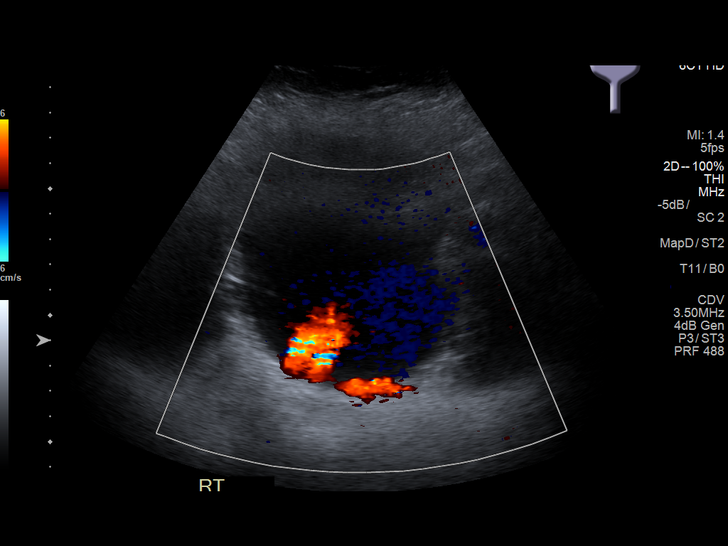
[im 56/61]
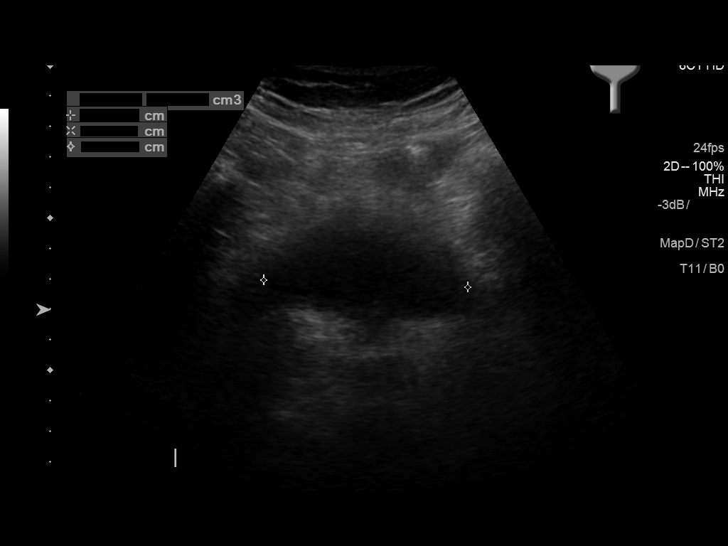
[im 61/61]
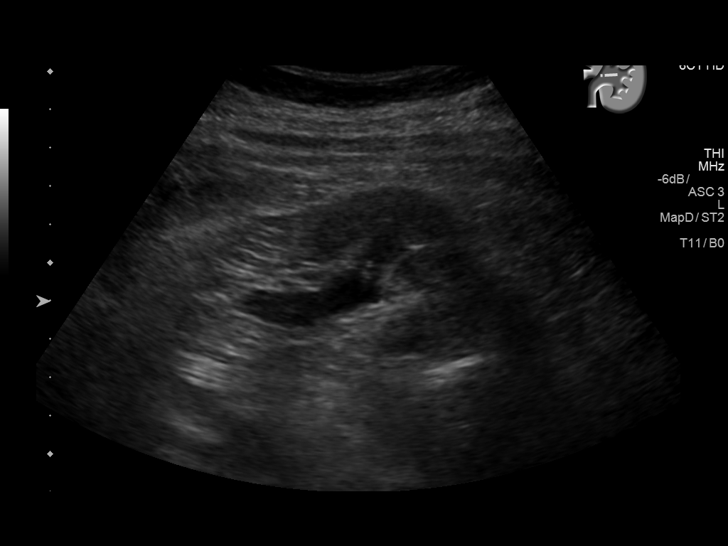

[13 of 25 positions shown; findings below may reference images not displayed]

FINDINGS: Right Kidney:

Renal measurements: 10.4 x 4.6 x 4.3 centimeters = volume: 107 mL .
Echogenicity within normal limits. No mass or hydronephrosis
visualized. Small 4-6 millimeter right lower pole echogenic area
(image 9) indeterminate for nephrolithiasis.

Left Kidney:

Renal measurements: 11.0 x 5.0 x 3.8 centimeters = volume: 108 mL.
Echogenicity within normal limits. No left renal mass. Mild
extrarenal pelvis suspected, no definite hydronephrosis. Similar
questionable lower pole echogenic foci.

Bladder:

Appears normal for degree of bladder distention. No urinary debris.
Both ureteral jets detected with Doppler. Prevoid bladder volume
estimated at 665 milliliters, with a postvoid volume of 26
milliliters.
IMPRESSION: 1. No acute renal findings. Left side extrarenal pelvis suspected
rather than mild hydronephrosis.
2. Questionable bilateral nephrolithiasis. Noncontrast CT Abdomen
and Pelvis would be most sensitive and specific.
3. Both ureteral jets detected with Doppler. Prevoid bladder volume
estimated at 665 mL, with a postvoid volume of 26 mL.

## 2020-02-07 MED ORDER — ACCU-CHEK AVIVA PLUS VI STRP: ORAL_STRIP | 0 refills | Status: DC

## 2020-02-08 MED ORDER — BLOOD GLUCOSE MONITOR KIT: PACK | 0 refills | Status: DC

## 2020-02-08 NOTE — Telephone Encounter (Signed)
Printed.  Please fax.

## 2020-02-08 NOTE — Telephone Encounter (Signed)
Faxed new glucose monitor kit to pharmacy today. Confirmation received.  Rahmir Beever,cma

## 2020-05-28 ENCOUNTER — Other Ambulatory Visit: Payer: Self-pay | Admitting: Family Medicine

## 2020-06-06 ENCOUNTER — Ambulatory Visit: Payer: Medicare Other

## 2020-06-06 ENCOUNTER — Ambulatory Visit: Payer: Medicare PPO | Admitting: Family Medicine

## 2020-06-07 DIAGNOSIS — N1831 Chronic kidney disease, stage 3a: Secondary | ICD-10-CM | POA: Diagnosis not present

## 2020-06-07 DIAGNOSIS — R809 Proteinuria, unspecified: Secondary | ICD-10-CM | POA: Diagnosis not present

## 2020-06-07 DIAGNOSIS — E1122 Type 2 diabetes mellitus with diabetic chronic kidney disease: Secondary | ICD-10-CM | POA: Diagnosis not present

## 2020-06-07 DIAGNOSIS — I10 Essential (primary) hypertension: Secondary | ICD-10-CM | POA: Diagnosis not present

## 2020-06-11 ENCOUNTER — Encounter: Payer: Self-pay | Admitting: Family Medicine

## 2020-06-12 ENCOUNTER — Other Ambulatory Visit: Payer: Self-pay

## 2020-06-12 ENCOUNTER — Encounter: Payer: Self-pay | Admitting: Family Medicine

## 2020-06-12 ENCOUNTER — Ambulatory Visit: Payer: Medicare PPO

## 2020-06-12 ENCOUNTER — Ambulatory Visit: Payer: Medicare PPO | Admitting: Family Medicine

## 2020-06-12 DIAGNOSIS — I10 Essential (primary) hypertension: Secondary | ICD-10-CM | POA: Diagnosis not present

## 2020-06-12 DIAGNOSIS — M79644 Pain in right finger(s): Secondary | ICD-10-CM | POA: Insufficient documentation

## 2020-06-12 DIAGNOSIS — E1121 Type 2 diabetes mellitus with diabetic nephropathy: Secondary | ICD-10-CM

## 2020-06-12 DIAGNOSIS — E785 Hyperlipidemia, unspecified: Secondary | ICD-10-CM | POA: Diagnosis not present

## 2020-06-12 LAB — POCT GLYCOSYLATED HEMOGLOBIN (HGB A1C): Hemoglobin A1C: 6 % — AB (ref 4.0–5.6)

## 2020-06-12 NOTE — Assessment & Plan Note (Addendum)
Adequately controlled.  Continue losartan 50 mg once daily.

## 2020-06-12 NOTE — Patient Instructions (Signed)
Nice to see you. Please monitor your finger and if it does not continue to improve please let us know.

## 2020-06-12 NOTE — Assessment & Plan Note (Signed)
Patient with possible muscular injury.  Her good range of motion and strength would argue against tendinous injury.  There is no bony tenderness.  Given that she is progressively improving I have discussed having her monitor this and let us know if not continuing to improve.

## 2020-06-12 NOTE — Progress Notes (Signed)
[name], MD Phone: [phone]  Amber Oliver is a 69 y.o. female who presents today for f/u.  HYPERTENSION Disease Monitoring: Blood pressure range-120s/80 Chest pain- no      Dyspnea- no Medications: Compliance- taking losartan    Edema- no  DIABETES Disease Monitoring: Blood Sugar ranges-<110 fasting Polyuria/phagia/dipsia- no      Optho- scheduled for november Medications: Compliance- taking metformin Hypoglycemic symptoms- no  HYPERLIPIDEMIA Disease Monitoring: See symptoms for Hypertension Medications: Compliance- taking crestor Right upper quadrant pain- no  Muscle aches- no  Right index finger pain: Patient notes 3 to 4 weeks ago she was helping her husband get his shoes on and she heard a rip in her right index finger.  She had discomfort along the radial aspect of the index MCP joint.  It initially had swelling and tenderness though this seems to have improved.  She bumped it again more recently and has had some recurrent pain that has been progressively improving.  She is able to move it in all ranges of motion.  It does hurt at the MCP joint when she pushes on the tip of her finger.    Social History   Tobacco Use  Smoking Status Former Smoker  . Packs/day: 0.25  . Years: 20.00  . Pack years: 5.00  . Types: Cigarettes  . Quit date: 05/09/1999  . Years since quitting: 21.1  Smokeless Tobacco Never Used     ROS see history of present illness  Objective  Physical Exam Vitals:   06/12/20 0927  BP: 130/70  Pulse: 69  Temp: 97.9 F (36.6 C)  SpO2: 97%    BP Readings from Last 3 Encounters:  06/12/20 130/70  01/18/20 116/70  12/06/19 120/70   Wt Readings from Last 3 Encounters:  06/12/20 162 lb 3.2 oz (73.6 kg)  01/18/20 165 lb (74.8 kg)  12/06/19 164 lb 3.2 oz (74.5 kg)    Physical Exam Constitutional:      General: She is not in acute distress.    Appearance: She is not diaphoretic.  Cardiovascular:     Rate and Rhythm: Normal  rate and regular rhythm.     Heart sounds: Normal heart sounds.  Pulmonary:     Effort: Pulmonary effort is normal.     Breath sounds: Normal breath sounds.  Musculoskeletal:     Right lower leg: No edema.     Left lower leg: No edema.     Comments: Right index finger with no tenderness, no tenderness at the index MCP joint of the right index finger, 5/5 strength right index finger flexion, extension, and interosseous muscles, good capillary refill  Skin:    General: Skin is warm and dry.  Neurological:     Mental Status: She is alert.      Assessment/Plan: Please see individual problem list.  Problem List Items Addressed This Visit    Finger pain, right    Patient with possible muscular injury.  Her good range of motion and strength would argue against tendinous injury.  There is no bony tenderness.  Given that she is progressively improving I have discussed having her monitor this and let us know if not continuing to improve.      Hyperlipidemia LDL goal <70    Continue Crestor.  Prior lipid panel well controlled.      Hypertension    Adequately controlled.  Continue losartan 50 mg once daily.      Type 2 diabetes with nephropathy (HCC)    Check  A1c.  Continue Metformin 500 mg twice daily.      Relevant Orders   POCT HgB A1C (Completed)       Health Maintenance: Patient declined flu vaccine and Covid vaccine.  I encouraged her to get these if she changes her mind.  She will keep her appointment for ophthalmology.   This visit occurred during the SARS-CoV-2 public health emergency.  Safety protocols were in place, including screening questions prior to the visit, additional usage of staff PPE, and extensive cleaning of exam room while observing appropriate contact time as indicated for disinfecting solutions.    [name], MD [place]

## 2020-06-12 NOTE — Assessment & Plan Note (Signed)
Check A1c.  Continue Metformin 500 mg twice daily. 

## 2020-06-12 NOTE — Assessment & Plan Note (Signed)
Continue Crestor.  Prior lipid panel well controlled.

## 2020-06-13 ENCOUNTER — Telehealth: Payer: Self-pay

## 2020-06-13 ENCOUNTER — Ambulatory Visit: Payer: Medicare PPO

## 2020-06-13 ENCOUNTER — Encounter: Payer: Self-pay | Admitting: Family Medicine

## 2020-06-13 NOTE — Telephone Encounter (Signed)
Unsuccessful attempt to call patient at preferred number for scheduled awv. No answer. Left message to call the office back. Reschedule as appropriate.

## 2020-06-14 DIAGNOSIS — Z882 Allergy status to sulfonamides status: Secondary | ICD-10-CM | POA: Diagnosis not present

## 2020-06-14 DIAGNOSIS — E785 Hyperlipidemia, unspecified: Secondary | ICD-10-CM | POA: Diagnosis not present

## 2020-06-14 DIAGNOSIS — Z7984 Long term (current) use of oral hypoglycemic drugs: Secondary | ICD-10-CM | POA: Diagnosis not present

## 2020-06-14 DIAGNOSIS — E119 Type 2 diabetes mellitus without complications: Secondary | ICD-10-CM | POA: Diagnosis not present

## 2020-06-14 DIAGNOSIS — Z881 Allergy status to other antibiotic agents status: Secondary | ICD-10-CM | POA: Diagnosis not present

## 2020-06-14 DIAGNOSIS — I10 Essential (primary) hypertension: Secondary | ICD-10-CM | POA: Diagnosis not present

## 2020-06-14 DIAGNOSIS — Z87891 Personal history of nicotine dependence: Secondary | ICD-10-CM | POA: Diagnosis not present

## 2020-06-14 DIAGNOSIS — I951 Orthostatic hypotension: Secondary | ICD-10-CM | POA: Diagnosis not present

## 2020-06-18 LAB — HM DIABETES EYE EXAM

## 2020-07-31 DIAGNOSIS — H902 Conductive hearing loss, unspecified: Secondary | ICD-10-CM | POA: Diagnosis not present

## 2020-07-31 DIAGNOSIS — H6123 Impacted cerumen, bilateral: Secondary | ICD-10-CM | POA: Diagnosis not present

## 2020-08-07 ENCOUNTER — Telehealth: Payer: Self-pay | Admitting: Family Medicine

## 2020-08-07 NOTE — Telephone Encounter (Signed)
Left message for patient to call back and schedule Medicare Annual Wellness Visit (AWV)   This should be a telephone visit only=30 minutes.  Last AWV 06/06/19; please schedule at anytime with Denisa O'Brien-Blaney at [facility].   

## 2020-08-26 ENCOUNTER — Encounter: Payer: Self-pay | Admitting: Family Medicine

## 2020-09-13 DIAGNOSIS — Z1231 Encounter for screening mammogram for malignant neoplasm of breast: Secondary | ICD-10-CM | POA: Diagnosis not present

## 2020-10-02 DIAGNOSIS — N182 Chronic kidney disease, stage 2 (mild): Secondary | ICD-10-CM | POA: Diagnosis not present

## 2020-10-02 DIAGNOSIS — R809 Proteinuria, unspecified: Secondary | ICD-10-CM | POA: Diagnosis not present

## 2020-10-02 DIAGNOSIS — I10 Essential (primary) hypertension: Secondary | ICD-10-CM | POA: Diagnosis not present

## 2020-10-02 DIAGNOSIS — E1122 Type 2 diabetes mellitus with diabetic chronic kidney disease: Secondary | ICD-10-CM | POA: Diagnosis not present

## 2020-10-17 ENCOUNTER — Ambulatory Visit (INDEPENDENT_AMBULATORY_CARE_PROVIDER_SITE_OTHER): Payer: Medicare PPO

## 2020-10-17 VITALS — Ht 65.0 in | Wt 162.0 lb

## 2020-10-17 DIAGNOSIS — Z0000 Encounter for general adult medical examination without abnormal findings: Secondary | ICD-10-CM

## 2020-10-17 NOTE — Patient Instructions (Addendum)
[name] , Thank you for taking time to come for your Medicare Wellness Visit. I appreciate your ongoing commitment to your health goals. Please review the following plan we discussed and let me know if I can assist you in the future.   These are the goals we discussed: Goals      Patient Stated   .  Increase physical activity (pt-stated)      Walk for exercise       This is a list of the screening recommended for you and due dates:  Health Maintenance  Topic Date Due  . COVID-19 Vaccine (1) 11/02/2020*  . Flu Shot  11/15/2020*  . Complete foot exam   12/05/2020  . Hemoglobin A1C  12/11/2020  . Eye exam for diabetics  06/18/2021  . Mammogram  09/04/2021  . Tetanus Vaccine  12/23/2022  . Colon Cancer Screening  08/22/2025  . DEXA scan (bone density measurement)  Completed  .  Hepatitis C: One time screening is recommended by Center for Disease Control  (CDC) for  adults born from [birth year] through 1966.   Completed  . Pneumonia vaccines  Completed  . HPV Vaccine  Aged Out  *Topic was postponed. The date shown is not the original due date.    Immunizations Immunization History  Administered Date(s) Administered  . Influenza, High Dose Seasonal PF 05/21/2016, 05/27/2017, 06/02/2018  . Influenza,inj,Quad PF,6+ Mos 05/11/2015  . Influenza-Unspecified 04/19/2015  . Pneumococcal Conjugate-13 05/11/2015  . Pneumococcal Polysaccharide-23 03/31/2013, 06/14/2018  . Pneumococcal-Unspecified 04/19/2015  . Tdap 08/18/2010, 12/22/2012  . Zoster 09/18/2012, 03/18/2013   Keep all routine maintenance appointments.   Follow up 12/12/20 @ 8:30  Advanced directives: not yet completed  Conditions/risks identified: none new  Follow up in one year for your annual wellness visit    Preventive Care 65 Years and Older, Female Preventive care refers to lifestyle choices and visits with your health care provider that can promote health and wellness. What does preventive care include?  A  yearly physical exam. This is also called an annual well check.  Dental exams once or twice a year.  Routine eye exams. Ask your health care provider how often you should have your eyes checked.  Personal lifestyle choices, including:  Daily care of your teeth and gums.  Regular physical activity.  Eating a healthy diet.  Avoiding tobacco and drug use.  Limiting alcohol use.  Practicing safe sex.  Taking low-dose aspirin every day.  Taking vitamin and mineral supplements as recommended by your health care provider. What happens during an annual well check? The services and screenings done by your health care provider during your annual well check will depend on your age, overall health, lifestyle risk factors, and family history of disease. Counseling  Your health care provider may ask you questions about your:  Alcohol use.  Tobacco use.  Drug use.  Emotional well-being.  Home and relationship well-being.  Sexual activity.  Eating habits.  History of falls.  Memory and ability to understand (cognition).  Work and work Statistician.  Reproductive health. Screening  You may have the following tests or measurements:  Height, weight, and BMI.  Blood pressure.  Lipid and cholesterol levels. These may be checked every 5 years, or more frequently if you are over 52 years old.  Skin check.  Lung cancer screening. You may have this screening every year starting at age 38 if you have a 30-pack-year history of smoking and currently smoke or have quit within the  past 15 years.  Fecal occult blood test (FOBT) of the stool. You may have this test every year starting at age 51.  Flexible sigmoidoscopy or colonoscopy. You may have a sigmoidoscopy every 5 years or a colonoscopy every 10 years starting at age 57.  Hepatitis C blood test.  Hepatitis B blood test.  Sexually transmitted disease (STD) testing.  Diabetes screening. This is done by checking your blood  sugar (glucose) after you have not eaten for a while (fasting). You may have this done every 1-3 years.  Bone density scan. This is done to screen for osteoporosis. You may have this done starting at age 25.  Mammogram. This may be done every 1-2 years. Talk to your health care provider about how often you should have regular mammograms. Talk with your health care provider about your test results, treatment options, and if necessary, the need for more tests. Vaccines  Your health care provider may recommend certain vaccines, such as:  Influenza vaccine. This is recommended every year.  Tetanus, diphtheria, and acellular pertussis (Tdap, Td) vaccine. You may need a Td booster every 10 years.  Zoster vaccine. You may need this after age 42.  Pneumococcal 13-valent conjugate (PCV13) vaccine. One dose is recommended after age 16.  Pneumococcal polysaccharide (PPSV23) vaccine. One dose is recommended after age 61. Talk to your health care provider about which screenings and vaccines you need and how often you need them. This information is not intended to replace advice given to you by your health care provider. Make sure you discuss any questions you have with your health care provider. Document Released: 08/31/2015 Document Revised: 04/23/2016 Document Reviewed: 06/05/2015 Elsevier Interactive Patient Education  2017 [place] Prevention in the Home Falls can cause injuries. They can happen to people of all ages. There are many things you can do to make your home safe and to help prevent falls. What can I do on the outside of my home?  Regularly fix the edges of walkways and driveways and fix any cracks.  Remove anything that might make you trip as you walk through a door, such as a raised step or threshold.  Trim any bushes or trees on the path to your home.  Use bright outdoor lighting.  Clear any walking paths of anything that might make someone trip, such as rocks or  tools.  Regularly check to see if handrails are loose or broken. Make sure that both sides of any steps have handrails.  Any raised decks and porches should have guardrails on the edges.  Have any leaves, snow, or ice cleared regularly.  Use sand or salt on walking paths during winter.  Clean up any spills in your garage right away. This includes oil or grease spills. What can I do in the bathroom?  Use night lights.  Install grab bars by the toilet and in the tub and shower. Do not use towel bars as grab bars.  Use non-skid mats or decals in the tub or shower.  If you need to sit down in the shower, use a plastic, non-slip stool.  Keep the floor dry. Clean up any water that spills on the floor as soon as it happens.  Remove soap buildup in the tub or shower regularly.  Attach bath mats securely with double-sided non-slip rug tape.  Do not have throw rugs and other things on the floor that can make you trip. What can I do in the bedroom?  Use night lights.  Make sure that you have a light by your bed that is easy to reach.  Do not use any sheets or blankets that are too big for your bed. They should not hang down onto the floor.  Have a firm chair that has side arms. You can use this for support while you get dressed.  Do not have throw rugs and other things on the floor that can make you trip. What can I do in the kitchen?  Clean up any spills right away.  Avoid walking on wet floors.  Keep items that you use a lot in easy-to-reach places.  If you need to reach something above you, use a strong step stool that has a grab bar.  Keep electrical cords out of the way.  Do not use floor polish or wax that makes floors slippery. If you must use wax, use non-skid floor wax.  Do not have throw rugs and other things on the floor that can make you trip. What can I do with my stairs?  Do not leave any items on the stairs.  Make sure that there are handrails on both  sides of the stairs and use them. Fix handrails that are broken or loose. Make sure that handrails are as long as the stairways.  Check any carpeting to make sure that it is firmly attached to the stairs. Fix any carpet that is loose or worn.  Avoid having throw rugs at the top or bottom of the stairs. If you do have throw rugs, attach them to the floor with carpet tape.  Make sure that you have a light switch at the top of the stairs and the bottom of the stairs. If you do not have them, ask someone to add them for you. What else can I do to help prevent falls?  Wear shoes that:  Do not have high heels.  Have rubber bottoms.  Are comfortable and fit you well.  Are closed at the toe. Do not wear sandals.  If you use a stepladder:  Make sure that it is fully opened. Do not climb a closed stepladder.  Make sure that both sides of the stepladder are locked into place.  Ask someone to hold it for you, if possible.  Clearly mark and make sure that you can see:  Any grab bars or handrails.  First and last steps.  Where the edge of each step is.  Use tools that help you move around (mobility aids) if they are needed. These include:  Canes.  Walkers.  Scooters.  Crutches.  Turn on the lights when you go into a dark area. Replace any light bulbs as soon as they burn out.  Set up your furniture so you have a clear path. Avoid moving your furniture around.  If any of your floors are uneven, fix them.  If there are any pets around you, be aware of where they are.  Review your medicines with your doctor. Some medicines can make you feel dizzy. This can increase your chance of falling. Ask your doctor what other things that you can do to help prevent falls. This information is not intended to replace advice given to you by your health care provider. Make sure you discuss any questions you have with your health care provider. Document Released: 05/31/2009 Document Revised:  01/10/2016 Document Reviewed: 09/08/2014 Elsevier Interactive Patient Education  2017 [employer].

## 2020-10-17 NOTE — Progress Notes (Addendum)
Subjective:   Amber Oliver is a 70 y.o. female who presents for Medicare Annual (Subsequent) preventive examination.  Review of Systems    No ROS.  Medicare Wellness Virtual Visit.    Cardiac Risk Factors include: advanced age (>58mn, >>14women);hypertension;diabetes mellitus     Objective:    Today's Vitals   10/17/20 0904  Weight: 162 lb (73.5 kg)  Height: 5' 5"  (1.651 m)   Body mass index is 26.96 kg/m.  Advanced Directives 06/06/2019 06/02/2018 05/27/2017  Does Patient Have a Medical Advance Directive? No No No  Does patient want to make changes to medical advance directive? - - Yes (MAU/Ambulatory/Procedural Areas - Information given)  Would patient like information on creating a medical advance directive? No - Patient declined No - Patient declined -    Current Medications (verified) Outpatient Encounter Medications as of 10/17/2020  Medication Sig  . blood glucose meter kit and supplies KIT Dispense based on patient and insurance preference. Use up to once daily as directed. (FOR ICD-10 E11.9).  .Marland KitchenCholecalciferol (VITAMIN D3) 25 MCG (1000 UT) CAPS Vitamin D3 25 mcg (1,000 unit) capsule   1 capsule every day by oral route.  .Marland Kitchenglucose blood (ACCU-CHEK AVIVA PLUS) test strip USE AS DIRECTED TO TEST BLOOD SUGAR 2 TIMES A DAY WITH ACCU CHEK MONITOR  . losartan (COZAAR) 50 MG tablet Take 50 mg by mouth daily.  . metFORMIN (GLUCOPHAGE) 500 MG tablet TAKE 1 TABLET BY MOUTH TWICE DAILY WITH A MEAL  . omeprazole (PRILOSEC) 40 MG capsule Take 40 mg by mouth daily as needed.   . rosuvastatin (CRESTOR) 40 MG tablet Take 1 tablet by mouth once daily   No facility-administered encounter medications on file as of 10/17/2020.    Allergies (verified) Ciprofloxacin, Sulfa drugs cross reactors, and Sulfa antibiotics   History: Past Medical History:  Diagnosis Date  . Diabetes mellitus   . Hyperlipidemia   . Hypertension   . Kidney stone    Past Surgical History:  Procedure  Laterality Date  . TRIGGER FINGER RELEASE     Family History  Problem Relation Age of Onset  . Heart disease Mother   . Heart failure Mother   . Diabetes Father   . Lung cancer Father   . Diabetes Sister   . Diabetes Brother   . Cancer Maternal Grandmother        breast   Social History   Socioeconomic History  . Marital status: Married    Spouse name: Not on file  . Number of children: Not on file  . Years of education: Not on file  . Highest education level: Not on file  Occupational History  . Not on file  Tobacco Use  . Smoking status: Former Smoker    Packs/day: 0.25    Years: 20.00    Pack years: 5.00    Types: Cigarettes    Quit date: 05/09/1999    Years since quitting: 21.4  . Smokeless tobacco: Never Used  Substance and Sexual Activity  . Alcohol use: No    Alcohol/week: 0.0 standard drinks  . Drug use: No  . Sexual activity: Never  Other Topics Concern  . Not on file  Social History Narrative  . Not on file   Social Determinants of Health   Financial Resource Strain: Not on file  Food Insecurity: No Food Insecurity  . Worried About RCharity fundraiserin the Last Year: Never true  . Ran Out of Food in the  Last Year: Never true  Transportation Needs: No Transportation Needs  . Lack of Transportation (Medical): No  . Lack of Transportation (Non-Medical): No  Physical Activity: Not on file  Stress: No Stress Concern Present  . Feeling of Stress : Not at all  Social Connections: Unknown  . Frequency of Communication with Friends and Family: More than three times a week  . Frequency of Social Gatherings with Friends and Family: More than three times a week  . Attends Religious Services: Not on file  . Active Member of Clubs or Organizations: Not on file  . Attends Archivist Meetings: Not on file  . Marital Status: Married    Tobacco Counseling Counseling given: Not Answered   Clinical Intake:  Pre-visit preparation completed:  Yes    Nutrition Risk Assessment: Has the patient had any N/V/D within the last 2 months?  No  Does the patient have any non-healing wounds?  No  Has the patient had any unintentional weight loss or weight gain?  No   Diabetes: If diabetic, was a CBG obtained today?  Yes , FBS 115 Did the patient bring in their glucometer from home?  No  How often do you monitor your CBG's? daily.   Financial Strains and Diabetes Management: Are you having any financial strains with the device, your supplies or your medication? No .  Does the patient want to be seen by Chronic Care Management for management of their diabetes?  No  Would the patient like to be referred to a Nutritionist or for Diabetic Management?  No     Diabetes: Yes (Followed by pcp)  How often do you need to have someone help you when you read instructions, pamphlets, or other written materials from your doctor or pharmacy?: 1 - Never   Interpreter Needed?: No      Activities of Daily Living In your present state of health, do you have any difficulty performing the following activities: 10/17/2020  Hearing? N  Vision? N  Difficulty concentrating or making decisions? N  Walking or climbing stairs? N  Dressing or bathing? N  Doing errands, shopping? N  Preparing Food and eating ? N  Using the Toilet? N  In the past six months, have you accidently leaked urine? N  Do you have problems with loss of bowel control? N  Managing your Medications? N  Managing your Finances? N  Housekeeping or managing your Housekeeping? N  Some recent data might be hidden    Patient Care Team: Leone Haven, MD as PCP - General (Family Medicine)  Indicate any recent Medical Services you may have received from other than [facility] providers in the past year (date may be approximate).     Assessment:   This is a routine wellness examination for Amber Oliver.  I connected with Amber Oliver today by telephone and verified that I am speaking with the  correct person using two identifiers. Location patient: home Location provider: work Persons participating in the virtual visit: patient, Marine scientist.    I discussed the limitations, risks, security and privacy concerns of performing an evaluation and management service by telephone and the availability of in person appointments. The patient expressed understanding and verbally consented to this telephonic visit.    Interactive audio and video telecommunications were attempted between this provider and patient, however failed, due to patient having technical difficulties OR patient did not have access to video capability.  We continued and completed visit with audio only.  Some vital signs may  be absent or patient reported.   Hearing/Vision screen No exam data present  Dietary issues and exercise activities discussed: Current Exercise Habits: The patient does not participate in regular exercise at present  Healthy diet Good water  Goals      Patient Stated   .  Increase physical activity (pt-stated)      Walk for exercise      Depression Screen PHQ 2/9 Scores 10/17/2020 06/12/2020 12/06/2019 06/06/2019 06/02/2018 05/27/2017 02/24/2017  PHQ - 2 Score 0 0 0 0 0 0 0  PHQ- 9 Score - - - - - 0 1    Fall Risk Fall Risk  10/17/2020 12/06/2019 06/06/2019 06/02/2018 05/27/2017  Falls in the past year? 0 0 0 No No  Number falls in past yr: 0 0 0 - -  Injury with Fall? 0 - - - -  Follow up Falls evaluation completed Falls evaluation completed - - -    FALL RISK PREVENTION PERTAINING TO THE HOME: Handrails in use when climbing stairs? Yes Home free of loose throw rugs in walkways, pet beds, electrical cords, etc? Yes  Adequate lighting in your home to reduce risk of falls? Yes   ASSISTIVE DEVICES UTILIZED TO PREVENT FALLS: Life alert? No  Use of a cane, walker or w/c? No   TIMED UP AND GO: Was the test performed? No . Virtual visit.   Cognitive Function: Patient is alert and oriented x3.   Enjoys crafts.  MMSE/6CIT deferred. Normal by direct communication/observation.  MMSE - Mini Mental State Exam 05/27/2017  Orientation to time 5  Orientation to Place 5  Registration 3  Attention/ Calculation 5  Recall 3  Language- name 2 objects 2  Language- repeat 1  Language- follow 3 step command 3  Language- read & follow direction 1  Write a sentence 1  Copy design 1  Total score 30     6CIT Screen 06/06/2019 06/02/2018  What Year? 0 points 0 points  What month? 0 points 0 points  What time? 0 points 0 points  Count back from 20 0 points 0 points  Months in reverse 0 points 0 points  Repeat phrase 0 points 0 points  Total Score 0 0    Immunizations Immunization History  Administered Date(s) Administered  . Influenza, High Dose Seasonal PF 05/21/2016, 05/27/2017, 06/02/2018  . Influenza,inj,Quad PF,6+ Mos 05/11/2015  . Influenza-Unspecified 04/19/2015  . Pneumococcal Conjugate-13 05/11/2015  . Pneumococcal Polysaccharide-23 03/31/2013, 06/14/2018  . Pneumococcal-Unspecified 04/19/2015  . Tdap 08/18/2010, 12/22/2012  . Zoster 09/18/2012, 03/18/2013    Health Maintenance Health Maintenance  Topic Date Due  . COVID-19 Vaccine (1) 11/02/2020 (Originally 12/11/1955)  . INFLUENZA VACCINE  11/15/2020 (Originally 03/18/2020)  . FOOT EXAM  12/05/2020  . HEMOGLOBIN A1C  12/11/2020  . OPHTHALMOLOGY EXAM  06/18/2021  . MAMMOGRAM  09/04/2021  . TETANUS/TDAP  12/23/2022  . COLONOSCOPY (Pts 45-45yr Insurance coverage will need to be confirmed)  08/22/2025  . DEXA SCAN  Completed  . Hepatitis C Screening  Completed  . PNA vac Low Risk Adult  Completed  . HPV VACCINES  Aged Out   Colorectal cancer screening: Type of screening: Colonoscopy. Completed 08/23/15. Repeat every 10 years  Mammogram status: Completed 09/05/19. Repeat every year  Vision Screening: Recommended annual ophthalmology exams for early detection of glaucoma and other disorders of the eye. Is the patient  up to date with their annual eye exam?  Yes   Dental Screening: Recommended annual dental exams for proper oral hygiene.  Community Resource Referral / Chronic Care Management: CRR required this visit?  No   CCM required this visit?  No      Plan:   Keep all routine maintenance appointments.   Follow up 12/12/20 @ 8:30  I have personally reviewed and noted the following in the patient's chart:   . Medical and social history . Use of alcohol, tobacco or illicit drugs  . Current medications and supplements . Functional ability and status . Nutritional status . Physical activity . Advanced directives . List of other physicians . Hospitalizations, surgeries, and ER visits in previous 12 months . Vitals . Screenings to include cognitive, depression, and falls . Referrals and appointments  In addition, I have reviewed and discussed with patient certain preventive protocols, quality metrics, and best practice recommendations. A written personalized care plan for preventive services as well as general preventive health recommendations were provided to patient via mychart.     OBrien-Blaney, Shakeem Stern L, LPN   11/16/9377     I have reviewed the above information and agree with above.   Deborra Medina, MD

## 2020-10-29 ENCOUNTER — Other Ambulatory Visit: Payer: Self-pay | Admitting: Family Medicine

## 2020-10-29 DIAGNOSIS — E1121 Type 2 diabetes mellitus with diabetic nephropathy: Secondary | ICD-10-CM

## 2020-11-07 DIAGNOSIS — L2089 Other atopic dermatitis: Secondary | ICD-10-CM | POA: Diagnosis not present

## 2020-11-07 DIAGNOSIS — L308 Other specified dermatitis: Secondary | ICD-10-CM | POA: Diagnosis not present

## 2020-11-07 DIAGNOSIS — Z86018 Personal history of other benign neoplasm: Secondary | ICD-10-CM | POA: Diagnosis not present

## 2020-11-07 DIAGNOSIS — L578 Other skin changes due to chronic exposure to nonionizing radiation: Secondary | ICD-10-CM | POA: Diagnosis not present

## 2020-11-19 ENCOUNTER — Other Ambulatory Visit: Payer: Self-pay | Admitting: Family Medicine

## 2020-12-06 ENCOUNTER — Other Ambulatory Visit: Payer: Self-pay

## 2020-12-12 ENCOUNTER — Ambulatory Visit: Payer: Medicare PPO | Admitting: Family Medicine

## 2020-12-12 ENCOUNTER — Encounter: Payer: Self-pay | Admitting: Family Medicine

## 2020-12-12 ENCOUNTER — Other Ambulatory Visit: Payer: Self-pay

## 2020-12-12 DIAGNOSIS — E785 Hyperlipidemia, unspecified: Secondary | ICD-10-CM | POA: Diagnosis not present

## 2020-12-12 DIAGNOSIS — I2510 Atherosclerotic heart disease of native coronary artery without angina pectoris: Secondary | ICD-10-CM | POA: Diagnosis not present

## 2020-12-12 DIAGNOSIS — E1121 Type 2 diabetes mellitus with diabetic nephropathy: Secondary | ICD-10-CM

## 2020-12-12 DIAGNOSIS — I10 Essential (primary) hypertension: Secondary | ICD-10-CM | POA: Diagnosis not present

## 2020-12-12 DIAGNOSIS — D1722 Benign lipomatous neoplasm of skin and subcutaneous tissue of left arm: Secondary | ICD-10-CM | POA: Diagnosis not present

## 2020-12-12 DIAGNOSIS — D179 Benign lipomatous neoplasm, unspecified: Secondary | ICD-10-CM | POA: Insufficient documentation

## 2020-12-12 DIAGNOSIS — M1990 Unspecified osteoarthritis, unspecified site: Secondary | ICD-10-CM | POA: Diagnosis not present

## 2020-12-12 LAB — COMPREHENSIVE METABOLIC PANEL
ALT: 19 U/L (ref 0–35)
AST: 17 U/L (ref 0–37)
Albumin: 4.4 g/dL (ref 3.5–5.2)
Alkaline Phosphatase: 55 U/L (ref 39–117)
BUN: 20 mg/dL (ref 6–23)
CO2: 24 mEq/L (ref 19–32)
Calcium: 9.8 mg/dL (ref 8.4–10.5)
Chloride: 105 mEq/L (ref 96–112)
Creatinine, Ser: 0.99 mg/dL (ref 0.40–1.20)
GFR: 57.57 mL/min — ABNORMAL LOW (ref >60.00)
Glucose, Bld: 106 mg/dL — ABNORMAL HIGH (ref 70–99)
Potassium: 4.1 mEq/L (ref 3.5–5.1)
Sodium: 138 mEq/L (ref 135–145)
Total Bilirubin: 0.7 mg/dL (ref 0.2–1.2)
Total Protein: 7 g/dL (ref 6.0–8.3)

## 2020-12-12 LAB — LIPID PANEL
Cholesterol: 131 mg/dL (ref 0–200)
HDL: 47.8 mg/dL (ref >39.00)
LDL Cholesterol: 58 mg/dL (ref 0–99)
NonHDL: 83.04
Total CHOL/HDL Ratio: 3
Triglycerides: 127 mg/dL (ref 0.0–149.0)
VLDL: 25.4 mg/dL (ref 0.0–40.0)

## 2020-12-12 LAB — HEMOGLOBIN A1C: Hgb A1c MFr Bld: 7 % — ABNORMAL HIGH (ref 4.6–6.5)

## 2020-12-12 NOTE — Assessment & Plan Note (Signed)
Patient with likely osteoarthritis in her hands.  She is asymptomatic.  She will monitor.

## 2020-12-12 NOTE — Assessment & Plan Note (Signed)
Check A1c.  Continue metformin 500 mg twice daily. 

## 2020-12-12 NOTE — Assessment & Plan Note (Signed)
Continue risk factor management. 

## 2020-12-12 NOTE — Progress Notes (Signed)
[name], MD Phone: [phone]  Amber Oliver is a 70 y.o. female who presents today for f/u.  HYPERTENSION  Disease Monitoring  Home BP Monitoring <130/80 Chest pain- no    Dyspnea- no Medications  Compliance-  Taking losartan.  Edema- no  DIABETES Disease Monitoring: Blood Sugar ranges-100-111 Polyuria/phagia/dipsia- no      Optho- UTD Medications: Compliance- taking metformin Hypoglycemic symptoms- no  HYPERLIPIDEMIA Symptoms Chest pain on exertion:  no   Leg claudication:   no Medications: Compliance- taking crestor Right upper quadrant pain- no  Muscle aches- no  Arthritis: Patient wonders about nodules at her DIP joints in her bilateral hands.  She notes no pain.  Lipoma/tingling: Patient know she has had a lipoma in her left forearm for about 20 years.  She saw an oncologist at [facility] about 20 years ago who advised that they would not do a surgery on it given risk to loss of function of her lower arm and hand.  Recently she has developed a area of tingling in her lower upper arm that is in the entire circumference of her arm.  It does not occur frequently and does not last very long when it does occur.  It does not radiate from her neck.  She wonders if this is related to nerve compression from the lipoma.  There are no symptoms distal to the lipoma.  There are no symptoms proximal to the area of tingling.  She reports there has been no significant change to the lipoma.     Social History   Tobacco Use  Smoking Status Former Smoker  . Packs/day: 0.25  . Years: 20.00  . Pack years: 5.00  . Types: Cigarettes  . Quit date: 05/09/1999  . Years since quitting: 21.6  Smokeless Tobacco Never Used    Current Outpatient Medications on File Prior to Visit  Medication Sig Dispense Refill  . blood glucose meter kit and supplies KIT Dispense based on patient and insurance preference. Use up to once daily as directed. (FOR ICD-10 E11.9). 1 each 0  . Cholecalciferol  (VITAMIN D3) 25 MCG (1000 UT) CAPS Vitamin D3 25 mcg (1,000 unit) capsule   1 capsule every day by oral route.    Marland Kitchen glucose blood (ACCU-CHEK AVIVA PLUS) test strip USE UP TO ONCE DAILY AS DIRECTED 100 each 1  . losartan (COZAAR) 100 MG tablet Take by mouth.    . metFORMIN (GLUCOPHAGE) 500 MG tablet TAKE 1 TABLET BY MOUTH TWICE DAILY WITH A MEAL 180 tablet 0  . omeprazole (PRILOSEC) 40 MG capsule Take 40 mg by mouth daily as needed.     . rosuvastatin (CRESTOR) 40 MG tablet Take 1 tablet by mouth once daily 90 tablet 0   No current facility-administered medications on file prior to visit.     ROS see history of present illness  Objective  Physical Exam Vitals:   12/12/20 0824  BP: 125/80  Pulse: 88  Temp: 97.8 F (36.6 C)  SpO2: 99%    BP Readings from Last 3 Encounters:  12/12/20 125/80  06/12/20 130/70  01/18/20 116/70   Wt Readings from Last 3 Encounters:  12/12/20 170 lb (77.1 kg)  10/17/20 162 lb (73.5 kg)  06/12/20 162 lb 3.2 oz (73.6 kg)    Physical Exam Constitutional:      General: She is not in acute distress.    Appearance: She is not diaphoretic.  Cardiovascular:     Rate and Rhythm: Normal rate and regular rhythm.  Heart sounds: Normal heart sounds.  Pulmonary:     Effort: Pulmonary effort is normal.     Breath sounds: Normal breath sounds.  Musculoskeletal:       Arms:     Comments: Joint nodularity noted at her DIP joints in all of her fingers  Skin:    General: Skin is warm and dry.  Neurological:     Mental Status: She is alert.     Comments: 5/5 strength bilateral biceps, triceps, handgrip, sensation light touch intact bilateral upper extremities      Assessment/Plan: Please see individual problem list.  Problem List Items Addressed This Visit    Type 2 diabetes with nephropathy (HCC)    Check A1c.  Continue metformin 500 mg twice daily.      Relevant Medications   losartan (COZAAR) 100 MG tablet   Other Relevant Orders   HgB  A1c   Hyperlipidemia LDL goal <70    Check lipid panel.  Continue Crestor 40 mg once daily.      Relevant Medications   losartan (COZAAR) 100 MG tablet   Other Relevant Orders   Lipid panel   Hypertension    Adequately controlled.  She will continue losartan 100 mg once daily.  Check labs.      Relevant Medications   losartan (COZAAR) 100 MG tablet   Other Relevant Orders   Comp Met (CMET)   Coronary atherosclerosis    Continue risk factor management.      Relevant Medications   losartan (COZAAR) 100 MG tablet   Arthritis    Patient with likely osteoarthritis in her hands.  She is asymptomatic.  She will monitor.      Lipoma    There has not been any significant change of the lipoma though she has developed some tingling proximal to this that could be related to nerve compression.  Given the development of the symptoms I would like for her to see a specialist to get their input on possible need for removal or further intervention.  The tingling does not seem to be from a more proximal lesion given its focality and involvement of multiple dermatomes.      Relevant Orders   Ambulatory referral to General Surgery       This visit occurred during the SARS-CoV-2 public health emergency.  Safety protocols were in place, including screening questions prior to the visit, additional usage of staff PPE, and extensive cleaning of exam room while observing appropriate contact time as indicated for disinfecting solutions.    [name], MD [place]

## 2020-12-12 NOTE — Assessment & Plan Note (Signed)
Check lipid panel.  Continue Crestor 40 mg once daily.

## 2020-12-12 NOTE — Patient Instructions (Signed)
Nice to see you. Somebody from [place] should contact you about an appointment.  If you do not hear anything please let us know. We will contact you with your lab results.

## 2020-12-12 NOTE — Assessment & Plan Note (Signed)
There has not been any significant change of the lipoma though she has developed some tingling proximal to this that could be related to nerve compression.  Given the development of the symptoms I would like for her to see a specialist to get their input on possible need for removal or further intervention.  The tingling does not seem to be from a more proximal lesion given its focality and involvement of multiple dermatomes.

## 2020-12-12 NOTE — Assessment & Plan Note (Signed)
Adequately controlled.  She will continue losartan 100 mg once daily.  Check labs.

## 2020-12-17 ENCOUNTER — Other Ambulatory Visit: Payer: Self-pay

## 2020-12-18 ENCOUNTER — Other Ambulatory Visit: Payer: Self-pay | Admitting: Family Medicine

## 2020-12-18 MED ORDER — METFORMIN HCL 500 MG PO TABS: 1000.0000 mg | ORAL_TABLET | Freq: Two times a day (BID) | ORAL | 1 refills | Status: DC

## 2021-01-23 ENCOUNTER — Encounter: Payer: Self-pay | Admitting: Family Medicine

## 2021-01-29 DIAGNOSIS — H902 Conductive hearing loss, unspecified: Secondary | ICD-10-CM | POA: Diagnosis not present

## 2021-01-29 DIAGNOSIS — H6123 Impacted cerumen, bilateral: Secondary | ICD-10-CM | POA: Diagnosis not present

## 2021-02-06 NOTE — Telephone Encounter (Signed)
Noted. I think she should be evaluated by them. Can you please contact her and let her know this? She might be able to call that office to make an appointment since the referral was already sent over.

## 2021-02-07 DIAGNOSIS — N1831 Chronic kidney disease, stage 3a: Secondary | ICD-10-CM | POA: Diagnosis not present

## 2021-02-07 DIAGNOSIS — E1122 Type 2 diabetes mellitus with diabetic chronic kidney disease: Secondary | ICD-10-CM | POA: Diagnosis not present

## 2021-02-07 DIAGNOSIS — N182 Chronic kidney disease, stage 2 (mild): Secondary | ICD-10-CM | POA: Diagnosis not present

## 2021-02-12 DIAGNOSIS — N1831 Chronic kidney disease, stage 3a: Secondary | ICD-10-CM | POA: Diagnosis not present

## 2021-02-12 DIAGNOSIS — R809 Proteinuria, unspecified: Secondary | ICD-10-CM | POA: Diagnosis not present

## 2021-02-12 DIAGNOSIS — I10 Essential (primary) hypertension: Secondary | ICD-10-CM | POA: Diagnosis not present

## 2021-02-12 DIAGNOSIS — E1122 Type 2 diabetes mellitus with diabetic chronic kidney disease: Secondary | ICD-10-CM | POA: Diagnosis not present

## 2021-02-23 ENCOUNTER — Other Ambulatory Visit: Payer: Self-pay | Admitting: Family Medicine

## 2021-02-27 ENCOUNTER — Other Ambulatory Visit: Payer: Self-pay

## 2021-02-27 ENCOUNTER — Encounter: Payer: Self-pay | Admitting: Internal Medicine

## 2021-02-27 ENCOUNTER — Ambulatory Visit: Payer: Medicare PPO | Admitting: Internal Medicine

## 2021-02-27 VITALS — BP 100/58 | HR 80 | Temp 98.3°F | Ht 65.0 in | Wt 165.0 lb

## 2021-02-27 DIAGNOSIS — J4520 Mild intermittent asthma, uncomplicated: Secondary | ICD-10-CM

## 2021-02-27 NOTE — Patient Instructions (Signed)
Avoid allergens Avoid secondhand smoke Please use mask as needed and indicated

## 2021-02-27 NOTE — Progress Notes (Signed)
MRN# [id number] Amber Oliver [birth year]   Brief History: Synopsis: 70 year old past medical history of hyperlipidemia diabetes presented to [facility] 09/2014 evaluation of chronic cough. Diagnosed bronchitis December 2015, chronic cough since then, usually has one cold per year treated with supportive care. PFTs with mild restriction TLC 67, RV 48, FEV1 82 MVC 87 FEV1/FVC 72, ERV 35, DLCO 114. Placed on Qvar trial with rapid improvement. Trial off lisinopril for 5 days, no improvement. Negative CT Chest. Doing well on ICS.    CC FOLLOW UP ASTHMA  HPI Patient has not had any respiratory symptoms since stopping Qvar She has avoided allergens Masks all the time Patient is doing excellent Patient's asthma is well controlled with avoidance of allergens  No exacerbation at this time No evidence of heart failure at this time No evidence or signs of infection at this time No respiratory distress No fevers, chills, nausea, vomiting, diarrhea No evidence of lower extremity edema No evidence hemoptysis   Perfumes are triggers Previous smoker quit 2000 1 pack/week for 10 years No indication for antibiotics or steroids at this time  Medication:    Current Outpatient Medications:    blood glucose meter kit and supplies KIT, Dispense based on patient and insurance preference. Use up to once daily as directed. (FOR ICD-10 E11.9)., Disp: 1 each, Rfl: 0   Cholecalciferol (VITAMIN D3) 25 MCG (1000 UT) CAPS, Vitamin D3 25 mcg (1,000 unit) capsule   1 capsule every day by oral route., Disp: , Rfl:    glucose blood (ACCU-CHEK AVIVA PLUS) test strip, USE UP TO ONCE DAILY AS DIRECTED, Disp: 100 each, Rfl: 1   losartan (COZAAR) 100 MG tablet, Take by mouth., Disp: , Rfl:    metFORMIN (GLUCOPHAGE) 500 MG tablet, Take 2 tablets (1,000 mg total) by mouth 2 (two) times daily with a meal., Disp: 360 tablet, Rfl: 1   omeprazole (PRILOSEC) 40 MG capsule, Take 40 mg by mouth daily as needed. , Disp: ,  Rfl:    rosuvastatin (CRESTOR) 40 MG tablet, Take 1 tablet by mouth once daily, Disp: 90 tablet, Rfl: 0     Review of Systems:  Gen:  Denies  fever, sweats, chills weight loss  HEENT: Denies blurred vision, double vision, ear pain, eye pain, hearing loss, nose bleeds, sore throat Cardiac:  No dizziness, chest pain or heaviness, chest tightness,edema, No JVD Resp:   No cough, -sputum production, -shortness of breath,-wheezing, -hemoptysis,  Other:  All other systems negative     BP (!) 100/58 (BP Location: Left Arm, Patient Position: Sitting, Cuff Size: Normal)   Pulse 80   Temp 98.3 F (36.8 C) (Oral)   Ht 5' 5"  (1.651 m)   Wt 165 lb (74.8 kg)   SpO2 97%   BMI 27.46 kg/m    .Physical Examination:   General Appearance: No distress  EYES PERRLA, EOM intact.   NECK Supple, No JVD Pulmonary: normal breath sounds, No wheezing.  CardiovascularNormal S1,S2.  No m/r/g.   Abdomen: Benign, Soft, non-tender. Skin:   warm, no rashes, no ecchymosis  Extremities: normal, no cyanosis, clubbing. Neuro:without focal findings,  speech normal  PSYCHIATRIC: Mood, affect within normal limits.   ALL OTHER ROS ARE NEGATIVE     Allergies:  Ciprofloxacin, Sulfa drugs cross reactors, and Sulfa antibiotics       A/P 70 year old pleasant white female seen today for follow-up mild intermittent asthma  Patient is well controlled with  avoidance of allergens 1 year She has not used her albuterol  in 1 year Patient is well controlled  No evidence of infection at this time No evidence of exacerbation at this time     TOTAL TIME SPENT 15 MINUTES  FOLLOW UP as needed  Amber Oliver, M.D.  [facility]  Medical Director [place] Director [facility] Cardio-Pulmonary Department

## 2021-02-28 DIAGNOSIS — D1722 Benign lipomatous neoplasm of skin and subcutaneous tissue of left arm: Secondary | ICD-10-CM | POA: Diagnosis not present

## 2021-05-13 ENCOUNTER — Encounter: Payer: Self-pay | Admitting: Family Medicine

## 2021-05-13 MED ORDER — BLOOD GLUCOSE MONITOR KIT: PACK | 0 refills | Status: AC

## 2021-05-18 ENCOUNTER — Other Ambulatory Visit: Payer: Self-pay | Admitting: Family Medicine

## 2021-05-21 MED ORDER — ROSUVASTATIN CALCIUM 40 MG PO TABS: 40.0000 mg | ORAL_TABLET | Freq: Every day | ORAL | 0 refills | Status: DC

## 2021-06-14 ENCOUNTER — Ambulatory Visit: Payer: Medicare PPO | Admitting: Family Medicine

## 2021-06-14 ENCOUNTER — Other Ambulatory Visit: Payer: Self-pay

## 2021-06-14 ENCOUNTER — Encounter: Payer: Self-pay | Admitting: Family Medicine

## 2021-06-14 DIAGNOSIS — K219 Gastro-esophageal reflux disease without esophagitis: Secondary | ICD-10-CM | POA: Diagnosis not present

## 2021-06-14 DIAGNOSIS — I10 Essential (primary) hypertension: Secondary | ICD-10-CM

## 2021-06-14 DIAGNOSIS — E1121 Type 2 diabetes mellitus with diabetic nephropathy: Secondary | ICD-10-CM | POA: Diagnosis not present

## 2021-06-14 DIAGNOSIS — N182 Chronic kidney disease, stage 2 (mild): Secondary | ICD-10-CM

## 2021-06-14 LAB — POCT GLYCOSYLATED HEMOGLOBIN (HGB A1C): Hemoglobin A1C: 6.2 % — AB (ref 4.0–5.6)

## 2021-06-14 MED ORDER — METFORMIN HCL 500 MG PO TABS: 1000.0000 mg | ORAL_TABLET | Freq: Every day | ORAL | 1 refills | Status: DC

## 2021-06-14 NOTE — Assessment & Plan Note (Signed)
Rare symptoms.  She can continue the very infrequent use of omeprazole.  She will let us know if she has any worsening symptoms.

## 2021-06-14 NOTE — Assessment & Plan Note (Signed)
She will have lab work next week with nephrology.  I discussed the potential for starting on Farxiga in the future for her CKD.

## 2021-06-14 NOTE — Progress Notes (Signed)
[name], MD Phone: [phone]  Amber Oliver is a 70 y.o. female who presents today for f/u.  HYPERTENSION Disease Monitoring Home BP Monitoring 120-130/70s Chest pain- no    Dyspnea- no Medications Compliance-  taking losartan.  Edema- no BMET    Component Value Date/Time   NA 138 12/12/2020 0902   NA 141 04/27/2012 1403   K 4.1 12/12/2020 0902   K 3.7 04/27/2012 1403   CL 105 12/12/2020 0902   CL 106 04/27/2012 1403   CO2 24 12/12/2020 0902   CO2 25 04/27/2012 1403   GLUCOSE 106 (H) 12/12/2020 0902   GLUCOSE 149 (H) 04/27/2012 1403   BUN 20 12/12/2020 0902   BUN 13 04/27/2012 1403   CREATININE 0.99 12/12/2020 0902   CREATININE 0.73 04/27/2012 1403   CALCIUM 9.8 12/12/2020 0902   CALCIUM 9.8 04/27/2012 1403   GFRNONAA >60 04/27/2012 1403   GFRAA >60 04/27/2012 1403   DIABETES Disease Monitoring: Blood Sugar ranges-not checking often though notes last glucose was 115 Polyuria/phagia/dipsia- no      Optho- UTD Medications: Compliance- taking metformin 1000 mg once daily Hypoglycemic symptoms- no  GERD:   Reflux symptoms: rare     Blood in stool: no  Dysphagia: no   EGD: no history of this  Medication: takes omeprazole about once a month  CKD stage II: patient follows with nephrology. She reports she see's them next week for labs and a visit. They have been monitoring her urine protein levels.   Social History   Tobacco Use  Smoking Status Former   Packs/day: 0.25   Years: 20.00   Pack years: 5.00   Types: Cigarettes   Quit date: 05/09/1999   Years since quitting: 22.1  Smokeless Tobacco Never    Current Outpatient Medications on File Prior to Visit  Medication Sig Dispense Refill   blood glucose meter kit and supplies KIT Dispense based on patient and insurance preference. Use once daily as directed. (FOR ICD-10 E11.9). 1 each 0   Cholecalciferol (VITAMIN D3) 25 MCG (1000 UT) CAPS Vitamin D3 25 mcg (1,000 unit) capsule   1 capsule every day  by oral route.     glucose blood (ACCU-CHEK AVIVA PLUS) test strip USE UP TO ONCE DAILY AS DIRECTED 100 each 1   losartan (COZAAR) 100 MG tablet Take by mouth.     omeprazole (PRILOSEC) 40 MG capsule Take 40 mg by mouth daily as needed.      rosuvastatin (CRESTOR) 40 MG tablet Take 1 tablet (40 mg total) by mouth daily. 90 tablet 0   No current facility-administered medications on file prior to visit.     ROS see history of present illness  Objective  Physical Exam Vitals:   06/14/21 0832  BP: 120/80  Pulse: 71  Temp: 98.5 F (36.9 C)  SpO2: 99%    BP Readings from Last 3 Encounters:  06/14/21 120/80  02/27/21 (!) 100/58  12/12/20 125/80   Wt Readings from Last 3 Encounters:  06/14/21 161 lb 3.2 oz (73.1 kg)  02/27/21 165 lb (74.8 kg)  12/12/20 170 lb (77.1 kg)    Physical Exam Constitutional:      General: She is not in acute distress.    Appearance: She is not diaphoretic.  Cardiovascular:     Rate and Rhythm: Normal rate and regular rhythm.     Heart sounds: Normal heart sounds.  Pulmonary:     Effort: Pulmonary effort is normal.     Breath sounds: Normal  breath sounds.  Musculoskeletal:     Right lower leg: No edema.     Left lower leg: No edema.  Skin:    General: Skin is warm and dry.  Neurological:     Mental Status: She is alert.   Diabetic Foot Exam - Simple   Simple Foot Form Diabetic Foot exam was performed with the following findings: Yes 06/14/2021  8:35 AM  Visual Inspection No deformities, no ulcerations, no other skin breakdown bilaterally: Yes Sensation Testing Intact to touch and monofilament testing bilaterally: Yes Pulse Check Posterior Tibialis and Dorsalis pulse intact bilaterally: Yes Comments      Assessment/Plan: Please see individual problem list.  Problem List Items Addressed This Visit     CKD (chronic kidney disease), stage II    She will have lab work next week with nephrology.  I discussed the potential for  starting on Farxiga in the future for her CKD.      GERD (gastroesophageal reflux disease)    Rare symptoms.  She can continue the very infrequent use of omeprazole.  She will let us know if she has any worsening symptoms.      Hypertension    Adequately controlled.  She will continue losartan 100 mg once daily.  She will have her renal function and electrolytes checked with nephrology next week.      Type 2 diabetes with nephropathy ([place])    Well-controlled.  She will continue metformin 1000 mg once daily.  I did discuss the potential for starting Farxiga in the future for her diabetes and her chronic kidney disease.      Relevant Medications   metFORMIN (GLUCOPHAGE) 500 MG tablet   Other Relevant Orders   POCT HgB A1C     Health Maintenance: I encouraged her to get the Shingrix vaccine.  I discussed that Medicare would be covering this in the new year.  She declines COVID-vaccine and flu vaccine.  Return in about 6 months (around 12/13/2021) for DM/HTN.  This visit occurred during the SARS-CoV-2 public health emergency.  Safety protocols were in place, including screening questions prior to the visit, additional usage of staff PPE, and extensive cleaning of exam room while observing appropriate contact time as indicated for disinfecting solutions.    [name], MD [facility]

## 2021-06-14 NOTE — Assessment & Plan Note (Signed)
Well-controlled.  She will continue metformin 1000 mg once daily.  I did discuss the potential for starting Farxiga in the future for her diabetes and her chronic kidney disease.

## 2021-06-14 NOTE — Assessment & Plan Note (Signed)
Adequately controlled.  She will continue losartan 100 mg once daily.  She will have her renal function and electrolytes checked with nephrology next week.

## 2021-06-14 NOTE — Patient Instructions (Addendum)
Nice to see you. Please continue your metformin 1000 mg once daily. Your A1c is 6.2.

## 2021-06-20 DIAGNOSIS — E1122 Type 2 diabetes mellitus with diabetic chronic kidney disease: Secondary | ICD-10-CM | POA: Diagnosis not present

## 2021-06-20 DIAGNOSIS — I10 Essential (primary) hypertension: Secondary | ICD-10-CM | POA: Diagnosis not present

## 2021-06-20 DIAGNOSIS — N1832 Chronic kidney disease, stage 3b: Secondary | ICD-10-CM | POA: Diagnosis not present

## 2021-06-20 DIAGNOSIS — R809 Proteinuria, unspecified: Secondary | ICD-10-CM | POA: Diagnosis not present

## 2021-07-22 LAB — HM DIABETES EYE EXAM

## 2021-07-31 DIAGNOSIS — H6123 Impacted cerumen, bilateral: Secondary | ICD-10-CM | POA: Diagnosis not present

## 2021-07-31 DIAGNOSIS — H902 Conductive hearing loss, unspecified: Secondary | ICD-10-CM | POA: Diagnosis not present

## 2021-07-31 DIAGNOSIS — M26649 Arthritis of unspecified temporomandibular joint: Secondary | ICD-10-CM | POA: Diagnosis not present

## 2021-08-24 ENCOUNTER — Other Ambulatory Visit: Payer: Self-pay | Admitting: Family Medicine

## 2021-09-13 ENCOUNTER — Other Ambulatory Visit: Payer: Self-pay | Admitting: Family Medicine

## 2021-09-13 DIAGNOSIS — E1121 Type 2 diabetes mellitus with diabetic nephropathy: Secondary | ICD-10-CM

## 2021-10-17 DIAGNOSIS — E1122 Type 2 diabetes mellitus with diabetic chronic kidney disease: Secondary | ICD-10-CM | POA: Diagnosis not present

## 2021-10-17 DIAGNOSIS — N1832 Chronic kidney disease, stage 3b: Secondary | ICD-10-CM | POA: Diagnosis not present

## 2021-10-17 DIAGNOSIS — I10 Essential (primary) hypertension: Secondary | ICD-10-CM | POA: Diagnosis not present

## 2021-10-17 DIAGNOSIS — R809 Proteinuria, unspecified: Secondary | ICD-10-CM | POA: Diagnosis not present

## 2021-10-17 DIAGNOSIS — N182 Chronic kidney disease, stage 2 (mild): Secondary | ICD-10-CM | POA: Diagnosis not present

## 2021-10-18 ENCOUNTER — Ambulatory Visit (INDEPENDENT_AMBULATORY_CARE_PROVIDER_SITE_OTHER): Payer: Medicare PPO

## 2021-10-18 VITALS — Ht 65.0 in | Wt 164.0 lb

## 2021-10-18 DIAGNOSIS — Z0000 Encounter for general adult medical examination without abnormal findings: Secondary | ICD-10-CM | POA: Diagnosis not present

## 2021-10-18 NOTE — Progress Notes (Signed)
Subjective:   Amber Oliver is a 71 y.o. female who presents for Medicare Annual (Subsequent) preventive examination.  Review of Systems    No ROS.  Medicare Wellness Virtual Visit.  Visual/audio telehealth visit, UTA vital signs.   See social history for additional risk factors.         Objective:    Today's Vitals   10/18/21 0903  Weight: 164 lb (74.4 kg)  Height: _0  (1.651 m)   Body mass index is 27.29 kg/m.  Advanced Directives 10/18/2021 06/06/2019 06/02/2018 05/27/2017  Does Patient Have a Medical Advance Directive? No No No No  Does patient want to make changes to medical advance directive? - - - Yes (MAU/Ambulatory/Procedural Areas - Information given)  Would patient like information on creating a medical advance directive? No - Patient declined No - Patient declined No - Patient declined -    Current Medications (verified) Outpatient Encounter Medications as of 10/18/2021  Medication Sig   ACCU-CHEK GUIDE test strip USE ONCE DAILY AS DIRECTED   blood glucose meter kit and supplies KIT Dispense based on patient and insurance preference. Use once daily as directed. (FOR ICD-10 E11.9).   Cholecalciferol (VITAMIN D3) 25 MCG (1000 UT) CAPS Vitamin D3 25 mcg (1,000 unit) capsule   1 capsule every day by oral route.   FARXIGA 10 MG TABS tablet Take by mouth.   metFORMIN (GLUCOPHAGE) 500 MG tablet Take 2 tablets (1,000 mg total) by mouth daily with breakfast.   omeprazole (PRILOSEC) 40 MG capsule Take 40 mg by mouth daily as needed.    rosuvastatin (CRESTOR) 40 MG tablet Take 1 tablet by mouth once daily   No facility-administered encounter medications on file as of 10/18/2021.    Allergies (verified) Ciprofloxacin, Sulfa drugs cross reactors, and Sulfa antibiotics   History: Past Medical History:  Diagnosis Date   Diabetes mellitus    Hyperlipidemia    Hypertension    Kidney stone    Past Surgical History:  Procedure Laterality Date   TRIGGER FINGER RELEASE      Family History  Problem Relation Age of Onset   Heart disease Mother    Heart failure Mother    Diabetes Father    Lung cancer Father    Diabetes Sister    Diabetes Brother    Cancer Maternal Grandmother        breast   Social History   Socioeconomic History   Marital status: Married    Spouse name: Not on file   Number of children: Not on file   Years of education: Not on file   Highest education level: Not on file  Occupational History   Not on file  Tobacco Use   Smoking status: Former    Packs/day: 0.25    Years: 20.00    Pack years: 5.00    Types: Cigarettes    Quit date: 05/09/1999    Years since quitting: 22.4   Smokeless tobacco: Never  Substance and Sexual Activity   Alcohol use: No    Alcohol/week: 0.0 standard drinks   Drug use: No   Sexual activity: Never  Other Topics Concern   Not on file  Social History Narrative   Not on file   Social Determinants of Health   Financial Resource Strain: Low Risk    Difficulty of Paying Living Expenses: Not hard at all  Food Insecurity: No Food Insecurity   Worried About Estate manager/land agent of Food in the Last Year: Never true  Ran Out of Food in the Last Year: Never true  Transportation Needs: No Transportation Needs   Lack of Transportation (Medical): No   Lack of Transportation (Non-Medical): No  Physical Activity: Sufficiently Active   Days of Exercise per Week: 3 days   Minutes of Exercise per Session: 60 min  Stress: No Stress Concern Present   Feeling of Stress : Not at all  Social Connections: Unknown   Frequency of Communication with Friends and Family: More than three times a week   Frequency of Social Gatherings with Friends and Family: More than three times a week   Attends Religious Services: Not on Electrical engineer or Organizations: Not on file   Attends Archivist Meetings: Not on file   Marital Status: Married    Tobacco Counseling Counseling given: Not  Answered   Clinical Intake:  Pre-visit preparation completed: Yes    Nutrition Risk Assessment: Does the patient have any non-healing wounds?  No   Financial Strains and Diabetes Management: Are you having any financial strains with the device, your supplies or your medication? No .  Does the patient want to be seen by Chronic Care Management for management of their diabetes?  No  Would the patient like to be referred to a Nutritionist or for Diabetic Management?  No      Diabetes: Yes (Followed by PCP)  How often do you need to have someone help you when you read instructions, pamphlets, or other written materials from your doctor or pharmacy?: 1 - Never    Interpreter Needed?: No      Activities of Daily Living No flowsheet data found.  Patient Care Team: Leone Haven, MD as PCP - General (Family Medicine)  Indicate any recent Medical Services you may have received from other than [facility] providers in the past year (date may be approximate).     Assessment:   This is a routine wellness examination for Amber Oliver.   Virtual Visit via Telephone Note  I connected with  Amber Oliver on 10/18/21 at  9:00 AM EST by telephone and verified that I am speaking with the correct person using two identifiers.  Persons participating in the virtual visit: patient/Nurse Health Advisor   I discussed the limitations, risks, security and privacy concerns of performing an evaluation and management service by telephone and the availability of in person appointments. The patient expressed understanding and agreed to proceed.  Interactive audio and video telecommunications were attempted between this nurse and patient, however failed, due to patient having technical difficulties OR patient did not have access to video capability.  We continued and completed visit with audio only.  Some vital signs may be absent or patient reported.   Hearing/Vision screen Hearing Screening -  Comments:: Patient is able to hear conversational tones without difficulty. No issues reported.  Dietary issues and exercise activities discussed:   Low carb diet Good water intake   Goals Addressed               This Visit's Progress     Patient Stated     Increase physical activity (pt-stated)        Walk for exercise.       Depression Screen PHQ 2/9 Scores 10/18/2021 06/14/2021 10/17/2020 06/12/2020 12/06/2019 06/06/2019 06/02/2018  PHQ - 2 Score 0 0 0 0 0 0 0  PHQ- 9 Score - - - - - - -    Fall Risk Fall Risk  06/14/2021  10/17/2020 12/06/2019 06/06/2019 06/02/2018  Falls in the past year? 0 0 0 0 No  Number falls in past yr: 0 0 0 0 -  Injury with Fall? - 0 - - -  Follow up Falls evaluation completed Falls evaluation completed Falls evaluation completed - -    FALL RISK PREVENTION PERTAINING TO THE HOME: Home free of loose throw rugs in walkways, pet beds, electrical cords, etc? Yes  Adequate lighting in your home to reduce risk of falls? Yes   ASSISTIVE DEVICES UTILIZED TO PREVENT FALLS: Life alert? No  Use of a cane, walker or w/c? No  Grab bars in the bathroom? No  Shower chair or bench in shower? No  Elevated toilet seat or a handicapped toilet? No   TIMED UP AND GO: Was the test performed? No .   Cognitive Function: Patient is alert and oriented x3.  Enjoys crafting and other brain health stimulating activities.  MMSE - Mini Mental State Exam 05/27/2017  Orientation to time 5  Orientation to Place 5  Registration 3  Attention/ Calculation 5  Recall 3  Language- name 2 objects 2  Language- repeat 1  Language- follow 3 step command 3  Language- read & follow direction 1  Write a sentence 1  Copy design 1  Total score 30     6CIT Screen 06/06/2019 06/02/2018  What Year? 0 points 0 points  What month? 0 points 0 points  What time? 0 points 0 points  Count back from 20 0 points 0 points  Months in reverse 0 points 0 points  Repeat phrase 0 points  0 points  Total Score 0 0    Immunizations Immunization History  Administered Date(s) Administered   Influenza, High Dose Seasonal PF 05/21/2016, 05/27/2017, 06/02/2018   Influenza,inj,Quad PF,6+ Mos 05/11/2015   Influenza-Unspecified 04/19/2015   Pneumococcal Conjugate-13 05/11/2015   Pneumococcal Polysaccharide-23 03/31/2013, 06/14/2018   Pneumococcal-Unspecified 04/19/2015   Tdap 08/18/2010, 12/22/2012   Zoster, Live 09/18/2012, 03/18/2013   Shingrix Completed?: No.    Education has been provided regarding the importance of this vaccine. Patient has been advised to call insurance company to determine out of pocket expense if they have not yet received this vaccine. Advised may also receive vaccine at local pharmacy or Health Dept. Verbalized acceptance and understanding.  Screening Tests Health Maintenance  Topic Date Due   MAMMOGRAM  10/29/2021 (Originally 09/04/2021)   INFLUENZA VACCINE  11/15/2021 (Originally 03/18/2021)   URINE MICROALBUMIN  12/15/2021 (Originally 06/03/2019)   Zoster Vaccines- Shingrix (1 of 2) 01/18/2022 (Originally 12/11/1999)   HEMOGLOBIN A1C  12/13/2021   FOOT EXAM  06/14/2022   OPHTHALMOLOGY EXAM  07/22/2022   TETANUS/TDAP  12/23/2022   COLONOSCOPY (Pts 45-30yr Insurance coverage will need to be confirmed)  08/22/2025   Pneumonia Vaccine 71 Years old  Completed   DEXA SCAN  Completed   Hepatitis C Screening  Completed   HPV VACCINES  Aged Out   COVID-19 Vaccine  Discontinued   Health Maintenance There are no preventive care reminders to display for this patient.  Mammogram- scheduled 10/29/21 at [facility]   Bone density- deferred per patient.   Lung Cancer Screening: (Low Dose CT Chest recommended if Age 139-80years, 30 pack-year currently smoking OR have quit w/in 15years.) does not qualify.   Vision Screening: Recommended annual ophthalmology exams for early detection of glaucoma and other disorders of the eye.  Dental Screening: Recommended  annual dental exams for proper oral hygiene  Community Resource Referral /  Chronic Care Management: CRR required this visit?  No   CCM required this visit?  No      Plan:   Keep all routine maintenance appointments.   I have personally reviewed and noted the following in the patients chart:   Medical and social history Use of alcohol, tobacco or illicit drugs  Current medications and supplements including opioid prescriptions.  Functional ability and status Nutritional status Physical activity Advanced directives List of other physicians Hospitalizations, surgeries, and ER visits in previous 12 months Vitals Screenings to include cognitive, depression, and falls Referrals and appointments  In addition, I have reviewed and discussed with patient certain preventive protocols, quality metrics, and best practice recommendations. A written personalized care plan for preventive services as well as general preventive health recommendations were provided to patient via mychart.     Varney Biles, LPN   11/19/7355

## 2021-10-18 NOTE — Patient Instructions (Addendum)
[name] , Thank you for taking time to come for your Medicare Wellness Visit. I appreciate your ongoing commitment to your health goals. Please review the following plan we discussed and let me know if I can assist you in the future.   These are the goals we discussed:  Goals       Patient Stated     Increase physical activity (pt-stated)      Walk for exercise.        This is a list of the screening recommended for you and due dates:  Health Maintenance  Topic Date Due   Mammogram  10/29/2021*   Flu Shot  11/15/2021*   Urine Protein Check  12/15/2021*   Zoster (Shingles) Vaccine (1 of 2) 01/18/2022*   Hemoglobin A1C  12/13/2021   Complete foot exam   06/14/2022   Eye exam for diabetics  07/22/2022   Tetanus Vaccine  12/23/2022   Colon Cancer Screening  08/22/2025   Pneumonia Vaccine  Completed   DEXA scan (bone density measurement)  Completed   Hepatitis C Screening: USPSTF Recommendation to screen - Ages 13-79 yo.  Completed   HPV Vaccine  Aged Out   COVID-19 Vaccine  Discontinued  *Topic was postponed. The date shown is not the original due date.    Advanced directives: not yet completed  Conditions/risks identified: none new  Next appointment: Follow up in one year for your annual wellness visit    Preventive Care 65 Years and Older, Female Preventive care refers to lifestyle choices and visits with your health care provider that can promote health and wellness. What does preventive care include? A yearly physical exam. This is also called an annual well check. Dental exams once or twice a year. Routine eye exams. Ask your health care provider how often you should have your eyes checked. Personal lifestyle choices, including: Daily care of your teeth and gums. Regular physical activity. Eating a healthy diet. Avoiding tobacco and drug use. Limiting alcohol use. Practicing safe sex. Taking low-dose aspirin every day. Taking vitamin and mineral  supplements as recommended by your health care provider. What happens during an annual well check? The services and screenings done by your health care provider during your annual well check will depend on your age, overall health, lifestyle risk factors, and family history of disease. Counseling  Your health care provider may ask you questions about your: Alcohol use. Tobacco use. Drug use. Emotional well-being. Home and relationship well-being. Sexual activity. Eating habits. History of falls. Memory and ability to understand (cognition). Work and work Statistician. Reproductive health. Screening  You may have the following tests or measurements: Height, weight, and BMI. Blood pressure. Lipid and cholesterol levels. These may be checked every 5 years, or more frequently if you are over 67 years old. Skin check. Lung cancer screening. You may have this screening every year starting at age 30 if you have a 30-pack-year history of smoking and currently smoke or have quit within the past 15 years. Fecal occult blood test (FOBT) of the stool. You may have this test every year starting at age 7. Flexible sigmoidoscopy or colonoscopy. You may have a sigmoidoscopy every 5 years or a colonoscopy every 10 years starting at age 82. Hepatitis C blood test. Hepatitis B blood test. Sexually transmitted disease (STD) testing. Diabetes screening. This is done by checking your blood sugar (glucose) after you have not eaten for a while (fasting). You may have this done every 1-3 years. Bone density  scan. This is done to screen for osteoporosis. You may have this done starting at age 60. Mammogram. This may be done every 1-2 years. Talk to your health care provider about how often you should have regular mammograms. Talk with your health care provider about your test results, treatment options, and if necessary, the need for more tests. Vaccines  Your health care provider may recommend certain  vaccines, such as: Influenza vaccine. This is recommended every year. Tetanus, diphtheria, and acellular pertussis (Tdap, Td) vaccine. You may need a Td booster every 10 years. Zoster vaccine. You may need this after age 35. Pneumococcal 13-valent conjugate (PCV13) vaccine. One dose is recommended after age 69. Pneumococcal polysaccharide (PPSV23) vaccine. One dose is recommended after age 48. Talk to your health care provider about which screenings and vaccines you need and how often you need them. This information is not intended to replace advice given to you by your health care provider. Make sure you discuss any questions you have with your health care provider. Document Released: 08/31/2015 Document Revised: 04/23/2016 Document Reviewed: 06/05/2015 Elsevier Interactive Patient Education  2017 [employer] Prevention in the Home Falls can cause injuries. They can happen to people of all ages. There are many things you can do to make your home safe and to help prevent falls. What can I do on the outside of my home? Regularly fix the edges of walkways and driveways and fix any cracks. Remove anything that might make you trip as you walk through a door, such as a raised step or threshold. Trim any bushes or trees on the path to your home. Use bright outdoor lighting. Clear any walking paths of anything that might make someone trip, such as rocks or tools. Regularly check to see if handrails are loose or broken. Make sure that both sides of any steps have handrails. Any raised decks and porches should have guardrails on the edges. Have any leaves, snow, or ice cleared regularly. Use sand or salt on walking paths during winter. Clean up any spills in your garage right away. This includes oil or grease spills. What can I do in the bathroom? Use night lights. Install grab bars by the toilet and in the tub and shower. Do not use towel bars as grab bars. Use non-skid mats or decals in  the tub or shower. If you need to sit down in the shower, use a plastic, non-slip stool. Keep the floor dry. Clean up any water that spills on the floor as soon as it happens. Remove soap buildup in the tub or shower regularly. Attach bath mats securely with double-sided non-slip rug tape. Do not have throw rugs and other things on the floor that can make you trip. What can I do in the bedroom? Use night lights. Make sure that you have a light by your bed that is easy to reach. Do not use any sheets or blankets that are too big for your bed. They should not hang down onto the floor. Have a firm chair that has side arms. You can use this for support while you get dressed. Do not have throw rugs and other things on the floor that can make you trip. What can I do in the kitchen? Clean up any spills right away. Avoid walking on wet floors. Keep items that you use a lot in easy-to-reach places. If you need to reach something above you, use a strong step stool that has a grab bar. Keep electrical  cords out of the way. Do not use floor polish or wax that makes floors slippery. If you must use wax, use non-skid floor wax. Do not have throw rugs and other things on the floor that can make you trip. What can I do with my stairs? Do not leave any items on the stairs. Make sure that there are handrails on both sides of the stairs and use them. Fix handrails that are broken or loose. Make sure that handrails are as long as the stairways. Check any carpeting to make sure that it is firmly attached to the stairs. Fix any carpet that is loose or worn. Avoid having throw rugs at the top or bottom of the stairs. If you do have throw rugs, attach them to the floor with carpet tape. Make sure that you have a light switch at the top of the stairs and the bottom of the stairs. If you do not have them, ask someone to add them for you. What else can I do to help prevent falls? Wear shoes that: Do not have high  heels. Have rubber bottoms. Are comfortable and fit you well. Are closed at the toe. Do not wear sandals. If you use a stepladder: Make sure that it is fully opened. Do not climb a closed stepladder. Make sure that both sides of the stepladder are locked into place. Ask someone to hold it for you, if possible. Clearly mark and make sure that you can see: Any grab bars or handrails. First and last steps. Where the edge of each step is. Use tools that help you move around (mobility aids) if they are needed. These include: Canes. Walkers. Scooters. Crutches. Turn on the lights when you go into a dark area. Replace any light bulbs as soon as they burn out. Set up your furniture so you have a clear path. Avoid moving your furniture around. If any of your floors are uneven, fix them. If there are any pets around you, be aware of where they are. Review your medicines with your doctor. Some medicines can make you feel dizzy. This can increase your chance of falling. Ask your doctor what other things that you can do to help prevent falls. This information is not intended to replace advice given to you by your health care provider. Make sure you discuss any questions you have with your health care provider. Document Released: 05/31/2009 Document Revised: 01/10/2016 Document Reviewed: 09/08/2014 Elsevier Interactive Patient Education  2017 [employer].

## 2021-10-29 DIAGNOSIS — Z1231 Encounter for screening mammogram for malignant neoplasm of breast: Secondary | ICD-10-CM | POA: Diagnosis not present

## 2021-10-29 LAB — HM MAMMOGRAPHY

## 2021-11-07 DIAGNOSIS — Z09 Encounter for follow-up examination after completed treatment for conditions other than malignant neoplasm: Secondary | ICD-10-CM | POA: Diagnosis not present

## 2021-11-07 DIAGNOSIS — L308 Other specified dermatitis: Secondary | ICD-10-CM | POA: Diagnosis not present

## 2021-11-07 DIAGNOSIS — D225 Melanocytic nevi of trunk: Secondary | ICD-10-CM | POA: Diagnosis not present

## 2021-11-07 DIAGNOSIS — L2089 Other atopic dermatitis: Secondary | ICD-10-CM | POA: Diagnosis not present

## 2021-11-07 DIAGNOSIS — L578 Other skin changes due to chronic exposure to nonionizing radiation: Secondary | ICD-10-CM | POA: Diagnosis not present

## 2021-11-07 DIAGNOSIS — Z86018 Personal history of other benign neoplasm: Secondary | ICD-10-CM | POA: Diagnosis not present

## 2021-11-07 DIAGNOSIS — R21 Rash and other nonspecific skin eruption: Secondary | ICD-10-CM | POA: Diagnosis not present

## 2021-11-07 DIAGNOSIS — L821 Other seborrheic keratosis: Secondary | ICD-10-CM | POA: Diagnosis not present

## 2021-11-26 ENCOUNTER — Other Ambulatory Visit: Payer: Self-pay | Admitting: Family Medicine

## 2021-11-26 DIAGNOSIS — E1121 Type 2 diabetes mellitus with diabetic nephropathy: Secondary | ICD-10-CM

## 2021-12-16 ENCOUNTER — Encounter: Payer: Self-pay | Admitting: Family Medicine

## 2021-12-16 ENCOUNTER — Ambulatory Visit: Payer: Medicare PPO | Admitting: Family Medicine

## 2021-12-16 VITALS — BP 118/76 | HR 67 | Temp 98.4°F | Ht 65.0 in | Wt 162.6 lb

## 2021-12-16 DIAGNOSIS — R197 Diarrhea, unspecified: Secondary | ICD-10-CM | POA: Diagnosis not present

## 2021-12-16 DIAGNOSIS — D1722 Benign lipomatous neoplasm of skin and subcutaneous tissue of left arm: Secondary | ICD-10-CM | POA: Diagnosis not present

## 2021-12-16 DIAGNOSIS — M1990 Unspecified osteoarthritis, unspecified site: Secondary | ICD-10-CM

## 2021-12-16 DIAGNOSIS — E1121 Type 2 diabetes mellitus with diabetic nephropathy: Secondary | ICD-10-CM | POA: Diagnosis not present

## 2021-12-16 DIAGNOSIS — I10 Essential (primary) hypertension: Secondary | ICD-10-CM | POA: Diagnosis not present

## 2021-12-16 LAB — LIPID PANEL
Cholesterol: 145 mg/dL (ref 0–200)
HDL: 51.3 mg/dL (ref >39.00)
LDL Cholesterol: 64 mg/dL (ref 0–99)
NonHDL: 93.75
Total CHOL/HDL Ratio: 3
Triglycerides: 148 mg/dL (ref 0.0–149.0)
VLDL: 29.6 mg/dL (ref 0.0–40.0)

## 2021-12-16 LAB — HEMOGLOBIN A1C: Hgb A1c MFr Bld: 6.7 % — ABNORMAL HIGH (ref 4.6–6.5)

## 2021-12-16 LAB — COMPREHENSIVE METABOLIC PANEL
ALT: 17 U/L (ref 0–35)
AST: 15 U/L (ref 0–37)
Albumin: 4.3 g/dL (ref 3.5–5.2)
Alkaline Phosphatase: 63 U/L (ref 39–117)
BUN: 14 mg/dL (ref 6–23)
CO2: 25 mEq/L (ref 19–32)
Calcium: 9.4 mg/dL (ref 8.4–10.5)
Chloride: 107 mEq/L (ref 96–112)
Creatinine, Ser: 0.93 mg/dL (ref 0.40–1.20)
GFR: 61.62 mL/min (ref >60.00)
Glucose, Bld: 98 mg/dL (ref 70–99)
Potassium: 4.1 mEq/L (ref 3.5–5.1)
Sodium: 141 mEq/L (ref 135–145)
Total Bilirubin: 0.8 mg/dL (ref 0.2–1.2)
Total Protein: 7 g/dL (ref 6.0–8.3)

## 2021-12-16 NOTE — Assessment & Plan Note (Signed)
Rarely occurs.  Possibly related to her metformin.  We will have her discontinue the metformin and see if that makes a difference.  If it does not make a difference she will let us know and we can refer to GI. ?

## 2021-12-16 NOTE — Progress Notes (Signed)
?[name], MD ?Phone: [phone] ? ?Amber Oliver is a 71 y.o. female who presents today for f/u. ? ?DIABETES ?Disease Monitoring: ?Blood Sugar ranges-<120 Polyuria/phagia/dipsia- no      Optho- UTD ?Medications: ?Compliance- taking metformin 1 tablet daily, farxiga Hypoglycemic symptoms- no ? ?HYPERTENSION ?Disease Monitoring ?Home BP Monitoring not checking Chest pain- no    Dyspnea- no ?Medications ?Compliance-  taking losartan.   Edema- no ?BMET ?   ?Component Value Date/Time  ? NA 138 12/12/2020 0902  ? NA 141 04/27/2012 1403  ? K 4.1 12/12/2020 0902  ? K 3.7 04/27/2012 1403  ? CL 105 12/12/2020 0902  ? CL 106 04/27/2012 1403  ? CO2 24 12/12/2020 0902  ? CO2 25 04/27/2012 1403  ? GLUCOSE 106 (H) 12/12/2020 0902  ? GLUCOSE 149 (H) 04/27/2012 1403  ? BUN 20 12/12/2020 0902  ? BUN 13 04/27/2012 1403  ? CREATININE 0.99 12/12/2020 0902  ? CREATININE 0.73 04/27/2012 1403  ? CALCIUM 9.8 12/12/2020 0902  ? CALCIUM 9.8 04/27/2012 1403  ? GFRNONAA >60 04/27/2012 1403  ? GFRAA >60 04/27/2012 1403  ? ?Diarrhea: Patient notes about once a month she will have an episode of diarrhea that comes on without warning.  She has no control when this happens.  Very rarely she will have some stomach growling prior to this though that does not always indicate that this will happen.  Typically she has a daily bowel movement that she describes as normal.  She notes no abdominal pain, blood in her stool, nausea, or vomiting.  She has not identified any dietary causes. ? ?Finger nodules: These are at her DIP joints.  There is no pain or stiffness. ? ? ?Social History  ? ?Tobacco Use  ?Smoking Status Former  ? Packs/day: 0.25  ? Years: 20.00  ? Pack years: 5.00  ? Types: Cigarettes  ? Quit date: 05/09/1999  ? Years since quitting: 22.6  ?Smokeless Tobacco Never  ? ? ?Current Outpatient Medications on File Prior to Visit  ?Medication Sig Dispense Refill  ? ACCU-CHEK GUIDE test strip USE 1 STRIP TO CHECK GLUCOSE ONCE DAILY AS  DIRECTED 50 each 0  ? blood glucose meter kit and supplies KIT Dispense based on patient and insurance preference. Use once daily as directed. (FOR ICD-10 E11.9). 1 each 0  ? Cholecalciferol (VITAMIN D3) 25 MCG (1000 UT) CAPS Vitamin D3 25 mcg (1,000 unit) capsule ?  1 capsule every day by oral route.    ? FARXIGA 10 MG TABS tablet Take by mouth.    ? losartan (COZAAR) 100 MG tablet Take 1 tablet by mouth daily.    ? omeprazole (PRILOSEC) 40 MG capsule Take 40 mg by mouth daily as needed.     ? rosuvastatin (CRESTOR) 40 MG tablet Take 1 tablet by mouth once daily 90 tablet 2  ? ?No current facility-administered medications on file prior to visit.  ? ? ? ?ROS see history of present illness ? ?Objective ? ?Physical Exam ?Vitals:  ? 12/16/21 0847  ?BP: 118/76  ?Pulse: 67  ?Temp: 98.4 ?F (36.9 ?C)  ?SpO2: 97%  ? ? ?BP Readings from Last 3 Encounters:  ?12/16/21 118/76  ?06/14/21 120/80  ?02/27/21 (!) 100/58  ? ?Wt Readings from Last 3 Encounters:  ?12/16/21 162 lb 9.6 oz (73.8 kg)  ?10/18/21 164 lb (74.4 kg)  ?06/14/21 161 lb 3.2 oz (73.1 kg)  ? ? ?Physical Exam ?Constitutional:   ?   General: She is not in acute distress. ?  Appearance: She is not diaphoretic.  ?Cardiovascular:  ?   Rate and Rhythm: Normal rate and regular rhythm.  ?   Heart sounds: Normal heart sounds.  ?Pulmonary:  ?   Effort: Pulmonary effort is normal.  ?   Breath sounds: Normal breath sounds.  ?Abdominal:  ?   General: Bowel sounds are normal. There is no distension.  ?   Palpations: Abdomen is soft.  ?   Tenderness: There is no abdominal tenderness.  ?Musculoskeletal:  ?   Comments: DIP joint nodules bilateral hands  ?Skin: ?   General: Skin is warm and dry.  ?Neurological:  ?   Mental Status: She is alert.  ? ? ? ?Assessment/Plan: Please see individual problem list. ? ?Problem List Items Addressed This Visit   ? ? Arthritis  ?  Patient likely has osteoarthritis in her hands based on her DIP nodules.  She is asymptomatic.  She will monitor. ? ?   ?  ? Diarrhea  ?  Rarely occurs.  Possibly related to her metformin.  We will have her discontinue the metformin and see if that makes a difference.  If it does not make a difference she will let us know and we can refer to GI. ? ?  ?  ? Hypertension  ?  Adequately controlled.  She will continue losartan 100 mg daily.  Check lab work. ? ?  ?  ? Relevant Medications  ? losartan (COZAAR) 100 MG tablet  ? Other Relevant Orders  ? Comp Met (CMET)  ? Lipid panel  ? Lipoma  ?  Reports she saw her specialist recently for this.  She notes they were not concerned about it and they continue to monitor. ? ?  ?  ? Type 2 diabetes with nephropathy (HCC) - Primary  ?  Check A1c.  She will discontinue metformin given possible GI side effects.  She will continue Farxiga 10 mg daily. ? ?  ?  ? Relevant Medications  ? losartan (COZAAR) 100 MG tablet  ? Other Relevant Orders  ? Hemoglobin A1c  ? ? ?Return in about 6 months (around 06/18/2022) for Diabetes follow-up. ? ?This visit occurred during the SARS-CoV-2 public health emergency.  Safety protocols were in place, including screening questions prior to the visit, additional usage of staff PPE, and extensive cleaning of exam room while observing appropriate contact time as indicated for disinfecting solutions.  ? ? ?[name], MD ?[place] ? ?

## 2021-12-16 NOTE — Patient Instructions (Signed)
Nice to see you. ?Please stop the metformin.  If it does not make a difference for your diarrhea please let us know. ?We will get lab work today and contact you with results. ?

## 2021-12-16 NOTE — Assessment & Plan Note (Signed)
Reports she saw her specialist recently for this.  She notes they were not concerned about it and they continue to monitor. ?

## 2021-12-16 NOTE — Assessment & Plan Note (Signed)
Adequately controlled.  She will continue losartan 100 mg daily.  Check lab work. ?

## 2021-12-16 NOTE — Assessment & Plan Note (Signed)
Patient likely has osteoarthritis in her hands based on her DIP nodules.  She is asymptomatic.  She will monitor. ?

## 2021-12-16 NOTE — Assessment & Plan Note (Signed)
Check A1c.  She will discontinue metformin given possible GI side effects.  She will continue Farxiga 10 mg daily. ?

## 2022-01-29 DIAGNOSIS — H6123 Impacted cerumen, bilateral: Secondary | ICD-10-CM | POA: Diagnosis not present

## 2022-01-29 DIAGNOSIS — H900 Conductive hearing loss, bilateral: Secondary | ICD-10-CM | POA: Diagnosis not present

## 2022-01-30 DIAGNOSIS — J45901 Unspecified asthma with (acute) exacerbation: Secondary | ICD-10-CM | POA: Diagnosis not present

## 2022-01-30 DIAGNOSIS — Z809 Family history of malignant neoplasm, unspecified: Secondary | ICD-10-CM | POA: Diagnosis not present

## 2022-01-30 DIAGNOSIS — I10 Essential (primary) hypertension: Secondary | ICD-10-CM | POA: Diagnosis not present

## 2022-01-30 DIAGNOSIS — K08409 Partial loss of teeth, unspecified cause, unspecified class: Secondary | ICD-10-CM | POA: Diagnosis not present

## 2022-01-30 DIAGNOSIS — E1122 Type 2 diabetes mellitus with diabetic chronic kidney disease: Secondary | ICD-10-CM | POA: Diagnosis not present

## 2022-01-30 DIAGNOSIS — Z7984 Long term (current) use of oral hypoglycemic drugs: Secondary | ICD-10-CM | POA: Diagnosis not present

## 2022-01-30 DIAGNOSIS — E785 Hyperlipidemia, unspecified: Secondary | ICD-10-CM | POA: Diagnosis not present

## 2022-01-30 DIAGNOSIS — Z8249 Family history of ischemic heart disease and other diseases of the circulatory system: Secondary | ICD-10-CM | POA: Diagnosis not present

## 2022-01-30 DIAGNOSIS — N1831 Chronic kidney disease, stage 3a: Secondary | ICD-10-CM | POA: Diagnosis not present

## 2022-02-17 DIAGNOSIS — N1831 Chronic kidney disease, stage 3a: Secondary | ICD-10-CM | POA: Diagnosis not present

## 2022-02-17 DIAGNOSIS — R809 Proteinuria, unspecified: Secondary | ICD-10-CM | POA: Diagnosis not present

## 2022-02-17 DIAGNOSIS — I10 Essential (primary) hypertension: Secondary | ICD-10-CM | POA: Diagnosis not present

## 2022-02-17 DIAGNOSIS — E1122 Type 2 diabetes mellitus with diabetic chronic kidney disease: Secondary | ICD-10-CM | POA: Diagnosis not present

## 2022-03-04 ENCOUNTER — Other Ambulatory Visit: Payer: Self-pay | Admitting: Family Medicine

## 2022-03-04 DIAGNOSIS — E1121 Type 2 diabetes mellitus with diabetic nephropathy: Secondary | ICD-10-CM

## 2022-05-08 ENCOUNTER — Other Ambulatory Visit: Payer: Self-pay | Admitting: Family Medicine

## 2022-05-08 DIAGNOSIS — E1121 Type 2 diabetes mellitus with diabetic nephropathy: Secondary | ICD-10-CM

## 2022-06-18 ENCOUNTER — Encounter: Payer: Self-pay | Admitting: Family Medicine

## 2022-06-18 ENCOUNTER — Ambulatory Visit: Payer: Medicare PPO | Admitting: Family Medicine

## 2022-06-18 VITALS — BP 115/70 | HR 79 | Temp 98.9°F | Ht 65.0 in | Wt 171.4 lb

## 2022-06-18 DIAGNOSIS — IMO0002 Reserved for concepts with insufficient information to code with codable children: Secondary | ICD-10-CM | POA: Insufficient documentation

## 2022-06-18 DIAGNOSIS — I10 Essential (primary) hypertension: Secondary | ICD-10-CM | POA: Diagnosis not present

## 2022-06-18 DIAGNOSIS — E1121 Type 2 diabetes mellitus with diabetic nephropathy: Secondary | ICD-10-CM | POA: Diagnosis not present

## 2022-06-18 DIAGNOSIS — E785 Hyperlipidemia, unspecified: Secondary | ICD-10-CM | POA: Diagnosis not present

## 2022-06-18 DIAGNOSIS — N182 Chronic kidney disease, stage 2 (mild): Secondary | ICD-10-CM | POA: Diagnosis not present

## 2022-06-18 DIAGNOSIS — K921 Melena: Secondary | ICD-10-CM | POA: Insufficient documentation

## 2022-06-18 LAB — POCT GLYCOSYLATED HEMOGLOBIN (HGB A1C): Hemoglobin A1C: 6.7 % — AB (ref 4.0–5.6)

## 2022-06-18 LAB — PROTEIN / CREATININE RATIO, URINE
Creatinine,Ur: 12
Protein Urine Random: 4

## 2022-06-18 NOTE — Assessment & Plan Note (Signed)
Check A1c.  She will continue Farxiga 10 mg daily.

## 2022-06-18 NOTE — Progress Notes (Signed)
[name], MD Phone: [phone]  Amber Oliver is a 71 y.o. female who presents today for f/u.  HYPERTENSION Disease Monitoring: Blood pressure range-<130/80 Chest pain- no      Dyspnea- no Medications: Compliance- taking losartan   Edema- no  DIABETES Disease Monitoring: Blood Sugar ranges-110-125 fasting, a little higher than they were previously Polyuria/phagia/dipsia- no      Optho- UTD Medications: Compliance- taking farxiga Hypoglycemic symptoms- no  HYPERLIPIDEMIA Disease Monitoring: See symptoms for Hypertension Medications: Compliance- taking crestor Right upper quadrant pain- no  Muscle aches- no  Toenail issue: Patient notes she was going to a pedicurist for her toenails.  She notes the toenail edges curl under.  She notes that her pedicurist quit and she wonders what she could do for her toenails.  Social History   Tobacco Use  Smoking Status Former   Packs/day: 0.25   Years: 20.00   Total pack years: 5.00   Types: Cigarettes   Quit date: 05/09/1999   Years since quitting: 23.1  Smokeless Tobacco Never    Current Outpatient Medications on File Prior to Visit  Medication Sig Dispense Refill   ACCU-CHEK GUIDE test strip USE 1 STRIP ONCE DAILY FOR BLOOD SUGAR AS DIRECTED 50 each 0   blood glucose meter kit and supplies KIT Dispense based on patient and insurance preference. Use once daily as directed. (FOR ICD-10 E11.9). 1 each 0   Cholecalciferol (VITAMIN D3) 25 MCG (1000 UT) CAPS Vitamin D3 25 mcg (1,000 unit) capsule   1 capsule every day by oral route.     FARXIGA 10 MG TABS tablet Take by mouth.     losartan (COZAAR) 100 MG tablet Take 1 tablet by mouth daily.     omeprazole (PRILOSEC) 40 MG capsule Take 40 mg by mouth daily as needed.      rosuvastatin (CRESTOR) 40 MG tablet Take 1 tablet by mouth once daily 90 tablet 0   No current facility-administered medications on file prior to visit.     ROS see history of present  illness  Objective  Physical Exam Vitals:   06/18/22 0846  BP: 115/70  Pulse: 79  Temp: 98.9 F (37.2 C)  SpO2: 97%    BP Readings from Last 3 Encounters:  06/18/22 115/70  12/16/21 118/76  06/14/21 120/80   Wt Readings from Last 3 Encounters:  06/18/22 171 lb 6.4 oz (77.7 kg)  12/16/21 162 lb 9.6 oz (73.8 kg)  10/18/21 164 lb (74.4 kg)    Physical Exam Constitutional:      General: She is not in acute distress.    Appearance: She is not diaphoretic.  Cardiovascular:     Rate and Rhythm: Normal rate and regular rhythm.     Heart sounds: Normal heart sounds.  Pulmonary:     Effort: Pulmonary effort is normal.     Breath sounds: Normal breath sounds.  Skin:    General: Skin is warm and dry.  Neurological:     Mental Status: She is alert.      Assessment/Plan: Please see individual problem list.  Problem List Items Addressed This Visit     CKD (chronic kidney disease), stage II (Chronic)    She will continue to see nephrology.  Farxiga and losartan are beneficial for this issue.      Hyperlipidemia LDL goal <70 (Chronic)    Continue Crestor 40 mg daily.      Hypertension (Chronic)    Well-controlled.  She will continue losartan 100 mg daily.  She will have her renal function and electrolytes checked through nephrology on Monday.      Type 2 diabetes with nephropathy (HCC) - Primary (Chronic)    Check A1c.  She will continue Farxiga 10 mg daily.      Relevant Orders   POCT HgB A1C (Completed)     Health Maintenance: Patient declines Shingrix vaccine.  She notes she would not consider taking this  Return in about 6 months (around 12/17/2022).   [name], MD [place]

## 2022-06-18 NOTE — Assessment & Plan Note (Signed)
Well-controlled.  She will continue losartan 100 mg daily.  She will have her renal function and electrolytes checked through nephrology on Monday.

## 2022-06-18 NOTE — Assessment & Plan Note (Signed)
Continue Crestor 40 mg daily.

## 2022-06-18 NOTE — Assessment & Plan Note (Signed)
She will continue to see nephrology.  Farxiga and losartan are beneficial for this issue.

## 2022-06-19 DIAGNOSIS — E1122 Type 2 diabetes mellitus with diabetic chronic kidney disease: Secondary | ICD-10-CM | POA: Diagnosis not present

## 2022-06-19 DIAGNOSIS — N1831 Chronic kidney disease, stage 3a: Secondary | ICD-10-CM | POA: Diagnosis not present

## 2022-06-23 DIAGNOSIS — R809 Proteinuria, unspecified: Secondary | ICD-10-CM | POA: Diagnosis not present

## 2022-06-23 DIAGNOSIS — I10 Essential (primary) hypertension: Secondary | ICD-10-CM | POA: Diagnosis not present

## 2022-06-23 DIAGNOSIS — N182 Chronic kidney disease, stage 2 (mild): Secondary | ICD-10-CM | POA: Diagnosis not present

## 2022-06-23 DIAGNOSIS — E1122 Type 2 diabetes mellitus with diabetic chronic kidney disease: Secondary | ICD-10-CM | POA: Diagnosis not present

## 2022-07-31 DIAGNOSIS — H6123 Impacted cerumen, bilateral: Secondary | ICD-10-CM | POA: Diagnosis not present

## 2022-07-31 DIAGNOSIS — H902 Conductive hearing loss, unspecified: Secondary | ICD-10-CM | POA: Diagnosis not present

## 2022-08-27 ENCOUNTER — Other Ambulatory Visit: Payer: Self-pay | Admitting: Family Medicine

## 2022-08-27 DIAGNOSIS — E1121 Type 2 diabetes mellitus with diabetic nephropathy: Secondary | ICD-10-CM

## 2022-10-07 ENCOUNTER — Encounter: Payer: Self-pay | Admitting: Family Medicine

## 2022-10-10 ENCOUNTER — Telehealth: Payer: Self-pay | Admitting: Family Medicine

## 2022-10-10 NOTE — Telephone Encounter (Signed)
Contacted Amber Oliver to schedule their annual wellness visit. Appointment made for 10/20/2022.  Thank you,  [place] Direct dial  [phone]

## 2022-10-15 ENCOUNTER — Encounter: Payer: Self-pay | Admitting: Family Medicine

## 2022-10-16 ENCOUNTER — Encounter: Payer: Self-pay | Admitting: Family Medicine

## 2022-10-20 ENCOUNTER — Ambulatory Visit (INDEPENDENT_AMBULATORY_CARE_PROVIDER_SITE_OTHER): Payer: Medicare PPO

## 2022-10-20 VITALS — Ht 65.0 in | Wt 171.0 lb

## 2022-10-20 DIAGNOSIS — Z0000 Encounter for general adult medical examination without abnormal findings: Secondary | ICD-10-CM

## 2022-10-20 NOTE — Patient Instructions (Addendum)
Amber Oliver , Thank you for taking time to come for your Medicare Wellness Visit. I appreciate your ongoing commitment to your health goals. Please review the following plan we discussed and let me know if I can assist you in the future.   These are the goals we discussed:  Goals       Patient Stated     Increase physical activity (pt-stated)      Walk for exercise.        This is a list of the screening recommended for you and due dates:  Health Maintenance  Topic Date Due   Flu Shot  11/16/2022*   Yearly kidney function blood test for diabetes  12/17/2022   Hemoglobin A1C  12/17/2022   DTaP/Tdap/Td vaccine (3 - Td or Tdap) 12/23/2022   Yearly kidney health urinalysis for diabetes  06/19/2023   Complete foot exam   06/19/2023   Eye exam for diabetics  09/01/2023   Medicare Annual Wellness Visit  10/20/2023   Mammogram  10/30/2023   Colon Cancer Screening  08/22/2025   Pneumonia Vaccine  Completed   DEXA scan (bone density measurement)  Completed   Hepatitis C Screening: USPSTF Recommendation to screen - Ages 60-79 yo.  Completed   HPV Vaccine  Aged Out   COVID-19 Vaccine  Discontinued   Zoster (Shingles) Vaccine  Discontinued  *Topic was postponed. The date shown is not the original due date.    Conditions/risks identified: none new  Next appointment: Follow up in one year for your annual wellness visit    Preventive Care 65 Years and Older, Female Preventive care refers to lifestyle choices and visits with your health care provider that can promote health and wellness. What does preventive care include? A yearly physical exam. This is also called an annual well check. Dental exams once or twice a year. Routine eye exams. Ask your health care provider how often you should have your eyes checked. Personal lifestyle choices, including: Daily care of your teeth and gums. Regular physical activity. Eating a healthy diet. Avoiding tobacco and drug use. Limiting alcohol  use. Practicing safe sex. Taking low-dose aspirin every day. Taking vitamin and mineral supplements as recommended by your health care provider. What happens during an annual well check? The services and screenings done by your health care provider during your annual well check will depend on your age, overall health, lifestyle risk factors, and family history of disease. Counseling  Your health care provider may ask you questions about your: Alcohol use. Tobacco use. Drug use. Emotional well-being. Home and relationship well-being. Sexual activity. Eating habits. History of falls. Memory and ability to understand (cognition). Work and work Statistician. Reproductive health. Screening  You may have the following tests or measurements: Height, weight, and BMI. Blood pressure. Lipid and cholesterol levels. These may be checked every 5 years, or more frequently if you are over 41 years old. Skin check. Lung cancer screening. You may have this screening every year starting at age 40 if you have a 30-pack-year history of smoking and currently smoke or have quit within the past 15 years. Fecal occult blood test (FOBT) of the stool. You may have this test every year starting at age 68. Flexible sigmoidoscopy or colonoscopy. You may have a sigmoidoscopy every 5 years or a colonoscopy every 10 years starting at age 45. Hepatitis C blood test. Hepatitis B blood test. Sexually transmitted disease (STD) testing. Diabetes screening. This is done by checking your blood sugar (glucose) after  you have not eaten for a while (fasting). You may have this done every 1-3 years. Bone density scan. This is done to screen for osteoporosis. You may have this done starting at age 47. Mammogram. This may be done every 1-2 years. Talk to your health care provider about how often you should have regular mammograms. Talk with your health care provider about your test results, treatment options, and if necessary,  the need for more tests. Vaccines  Your health care provider may recommend certain vaccines, such as: Influenza vaccine. This is recommended every year. Tetanus, diphtheria, and acellular pertussis (Tdap, Td) vaccine. You may need a Td booster every 10 years. Zoster vaccine. You may need this after age 40. Pneumococcal 13-valent conjugate (PCV13) vaccine. One dose is recommended after age 45. Pneumococcal polysaccharide (PPSV23) vaccine. One dose is recommended after age 42. Talk to your health care provider about which screenings and vaccines you need and how often you need them. This information is not intended to replace advice given to you by your health care provider. Make sure you discuss any questions you have with your health care provider. Document Released: 08/31/2015 Document Revised: 04/23/2016 Document Reviewed: 06/05/2015 Elsevier Interactive Patient Education  2017 [place] Prevention in the Home Falls can cause injuries. They can happen to people of all ages. There are many things you can do to make your home safe and to help prevent falls. What can I do on the outside of my home? Regularly fix the edges of walkways and driveways and fix any cracks. Remove anything that might make you trip as you walk through a door, such as a raised step or threshold. Trim any bushes or trees on the path to your home. Use bright outdoor lighting. Clear any walking paths of anything that might make someone trip, such as rocks or tools. Regularly check to see if handrails are loose or broken. Make sure that both sides of any steps have handrails. Any raised decks and porches should have guardrails on the edges. Have any leaves, snow, or ice cleared regularly. Use sand or salt on walking paths during winter. Clean up any spills in your garage right away. This includes oil or grease spills. What can I do in the bathroom? Use night lights. Install grab bars by the toilet and in the  tub and shower. Do not use towel bars as grab bars. Use non-skid mats or decals in the tub or shower. If you need to sit down in the shower, use a plastic, non-slip stool. Keep the floor dry. Clean up any water that spills on the floor as soon as it happens. Remove soap buildup in the tub or shower regularly. Attach bath mats securely with double-sided non-slip rug tape. Do not have throw rugs and other things on the floor that can make you trip. What can I do in the bedroom? Use night lights. Make sure that you have a light by your bed that is easy to reach. Do not use any sheets or blankets that are too big for your bed. They should not hang down onto the floor. Have a firm chair that has side arms. You can use this for support while you get dressed. Do not have throw rugs and other things on the floor that can make you trip. What can I do in the kitchen? Clean up any spills right away. Avoid walking on wet floors. Keep items that you use a lot in easy-to-reach places. If you  need to reach something above you, use a strong step stool that has a grab bar. Keep electrical cords out of the way. Do not use floor polish or wax that makes floors slippery. If you must use wax, use non-skid floor wax. Do not have throw rugs and other things on the floor that can make you trip. What can I do with my stairs? Do not leave any items on the stairs. Make sure that there are handrails on both sides of the stairs and use them. Fix handrails that are broken or loose. Make sure that handrails are as long as the stairways. Check any carpeting to make sure that it is firmly attached to the stairs. Fix any carpet that is loose or worn. Avoid having throw rugs at the top or bottom of the stairs. If you do have throw rugs, attach them to the floor with carpet tape. Make sure that you have a light switch at the top of the stairs and the bottom of the stairs. If you do not have them, ask someone to add them for  you. What else can I do to help prevent falls? Wear shoes that: Do not have high heels. Have rubber bottoms. Are comfortable and fit you well. Are closed at the toe. Do not wear sandals. If you use a stepladder: Make sure that it is fully opened. Do not climb a closed stepladder. Make sure that both sides of the stepladder are locked into place. Ask someone to hold it for you, if possible. Clearly mark and make sure that you can see: Any grab bars or handrails. First and last steps. Where the edge of each step is. Use tools that help you move around (mobility aids) if they are needed. These include: Canes. Walkers. Scooters. Crutches. Turn on the lights when you go into a dark area. Replace any light bulbs as soon as they burn out. Set up your furniture so you have a clear path. Avoid moving your furniture around. If any of your floors are uneven, fix them. If there are any pets around you, be aware of where they are. Review your medicines with your doctor. Some medicines can make you feel dizzy. This can increase your chance of falling. Ask your doctor what other things that you can do to help prevent falls. This information is not intended to replace advice given to you by your health care provider. Make sure you discuss any questions you have with your health care provider. Document Released: 05/31/2009 Document Revised: 01/10/2016 Document Reviewed: 09/08/2014 Elsevier Interactive Patient Education  2017 [employer].

## 2022-10-20 NOTE — Progress Notes (Signed)
Subjective:   Quinnlan Moussa is a 72 y.o. female who presents for Medicare Annual (Subsequent) preventive examination.  Review of Systems    No ROS.  Medicare Wellness Virtual Visit.  Visual/audio telehealth visit, UTA vital signs.   See social history for additional risk factors.   Cardiac Risk Factors include: advanced age (>38mn, >>46women);diabetes mellitus     Objective:    Today's Vitals   10/20/22 1120  Weight: 171 lb (77.6 kg)  Height: '5\' 5"'$  (1.651 m)   Body mass index is 28.46 kg/m.     10/20/2022   11:18 AM 10/18/2021    9:12 AM 06/06/2019    8:53 AM 06/02/2018    5:20 PM 05/27/2017    8:22 AM  Advanced Directives  Does Patient Have a Medical Advance Directive? No No No No No  Does patient want to make changes to medical advance directive?     Yes (MAU/Ambulatory/Procedural Areas - Information given)  Would patient like information on creating a medical advance directive? No - Patient declined No - Patient declined No - Patient declined No - Patient declined     Current Medications (verified) Outpatient Encounter Medications as of 10/20/2022  Medication Sig   ACCU-CHEK GUIDE test strip USE 1 STRIP TO CHECK GLUCOSE ONCE DAILY AS DIRECTED FOR BLOOD SUGAR   blood glucose meter kit and supplies KIT Dispense based on patient and insurance preference. Use once daily as directed. (FOR ICD-10 E11.9).   Cholecalciferol (VITAMIN D3) 25 MCG (1000 UT) CAPS Vitamin D3 25 mcg (1,000 unit) capsule   1 capsule every day by oral route.   FARXIGA 10 MG TABS tablet Take by mouth.   losartan (COZAAR) 100 MG tablet Take 1 tablet by mouth daily.   omeprazole (PRILOSEC) 40 MG capsule Take 40 mg by mouth daily as needed.    rosuvastatin (CRESTOR) 40 MG tablet Take 1 tablet by mouth once daily   No facility-administered encounter medications on file as of 10/20/2022.    Allergies (verified) Ciprofloxacin, Sulfa drugs cross reactors, and Sulfa antibiotics   History: Past Medical  History:  Diagnosis Date   Diabetes mellitus    Hyperlipidemia    Hypertension    Kidney stone    Past Surgical History:  Procedure Laterality Date   TRIGGER FINGER RELEASE     Family History  Problem Relation Age of Onset   Heart disease Mother    Heart failure Mother    Diabetes Father    Lung cancer Father    Diabetes Sister    Diabetes Brother    Cancer Maternal Grandmother        breast   Social History   Socioeconomic History   Marital status: Married    Spouse name: Not on file   Number of children: Not on file   Years of education: Not on file   Highest education level: Not on file  Occupational History   Not on file  Tobacco Use   Smoking status: Former    Packs/day: 0.25    Years: 20.00    Total pack years: 5.00    Types: Cigarettes    Quit date: 05/09/1999    Years since quitting: 23.4   Smokeless tobacco: Never  Substance and Sexual Activity   Alcohol use: No    Alcohol/week: 0.0 standard drinks of alcohol   Drug use: No   Sexual activity: Never  Other Topics Concern   Not on file  Social History Narrative   Not  on file   Social Determinants of Health   Financial Resource Strain: Low Risk  (10/15/2022)   Overall Financial Resource Strain (CARDIA)    Difficulty of Paying Living Expenses: Not hard at all  Food Insecurity: No Food Insecurity (10/16/2022)   Hunger Vital Sign    Worried About Running Out of Food in the Last Year: Never true    Ran Out of Food in the Last Year: Never true  Transportation Needs: No Transportation Needs (10/15/2022)   PRAPARE - Hydrologist (Medical): No    Lack of Transportation (Non-Medical): No  Physical Activity: Insufficiently Active (10/15/2022)   Exercise Vital Sign    Days of Exercise per Week: 3 days    Minutes of Exercise per Session: 30 min  Stress: No Stress Concern Present (10/15/2022)   [place]    Feeling  of Stress : Only a little  Social Connections: Unknown (10/15/2022)   Social Connection and Isolation Panel [NHANES]    Frequency of Communication with Friends and Family: More than three times a week    Frequency of Social Gatherings with Friends and Family: More than three times a week    Attends Religious Services: Not on Advertising copywriter or Organizations: Yes    Attends Music therapist: More than 4 times per year    Marital Status: Married    Tobacco Counseling Counseling given: Not Answered   Clinical Intake:  Pre-visit preparation completed: Yes        Diabetes: Yes (Followed by PCP)  How often do you need to have someone help you when you read instructions, pamphlets, or other written materials from your doctor or pharmacy?: 1 - Never    Interpreter Needed?: No      Activities of Daily Living    10/15/2022   12:01 PM  In your present state of health, do you have any difficulty performing the following activities:  Hearing? 0  Vision? 0  Difficulty concentrating or making decisions? 0  Walking or climbing stairs? 0  Dressing or bathing? 0  Doing errands, shopping? 0  Preparing Food and eating ? N  Using the Toilet? N  In the past six months, have you accidently leaked urine? Y  Comment Followed by Dr. Holley Raring and pcp. Urinary frequency. Managable with no follow up at this time, per patient.  Do you have problems with loss of bowel control? Y  Comment Intermittent diarrhea. Managed with daily brief. Not having as much difficulty. Followed by PCP.  Managing your Medications? N  Managing your Finances? N  Housekeeping or managing your Housekeeping? N    Patient Care Team: Leone Haven, MD as PCP - General (Family Medicine)  Indicate any recent Medical Services you may have received from other than [facility] providers in the past year (date may be approximate).     Assessment:   This is a routine wellness examination for  [place].  I connected with  Gerarda Gunther on 10/20/22 by a audio enabled telemedicine application and verified that I am speaking with the correct person using two identifiers.  Patient Location: Home  Provider Location: Office/Clinic  I discussed the limitations of evaluation and management by telemedicine. The patient expressed understanding and agreed to proceed.   Patient Medicare AWV questionnaire was completed by the patient on 10/15/22 and 10/16/22, I have confirmed that all information answered by patient is correct and  no changes since this date.   Hearing/Vision screen  Hearing Screening - Comments:: Patient is able to hear conversational tones without difficulty. No issues reported. Vision Screening - Comments:: Followed by [facility], Dr. Thornell Sartorius Wears corrective lenses No retinopathy reported They have seen their ophthalmologist in the last 12 months.    Dietary issues and exercise activities discussed: Current Exercise Habits: Home exercise routine, Intensity: Mild Healthy diet Good water intake    Goals Addressed               This Visit's Progress     Patient Stated     Increase physical activity (pt-stated)   On track     Walk for exercise.       Depression Screen    10/20/2022   11:20 AM 06/18/2022    8:48 AM 10/18/2021    9:11 AM 06/14/2021    8:34 AM 10/17/2020    9:10 AM 06/12/2020    9:28 AM 12/06/2019    9:06 AM  PHQ 2/9 Scores  PHQ - 2 Score 0 0 0 0 0 0 0    Fall Risk    10/15/2022   12:01 PM 06/18/2022    8:48 AM 06/14/2021    8:33 AM 10/17/2020    9:11 AM 12/06/2019    9:06 AM  [place] in the past year? 0 0 0 0 0  Number falls in past yr: 0 0 0 0 0  Injury with Fall? 0 0  0   Risk for fall due to :  No Fall Risks     Follow up  Falls evaluation completed Falls evaluation completed Falls evaluation completed Falls evaluation completed    [place]: Home free of loose  throw rugs in walkways, pet beds, electrical cords, etc? Yes  Adequate lighting in your home to reduce risk of falls? Yes   ASSISTIVE DEVICES UTILIZED TO PREVENT FALLS: Life alert? No  Use of a cane, walker or w/c? No  Grab bars in the bathroom? Yes  Shower chair or bench in shower? No  Elevated toilet seat or a handicapped toilet? No   TIMED UP AND GO: Was the test performed? No .   Cognitive Function:    05/27/2017    8:40 AM  MMSE - Mini Mental State Exam  Orientation to time 5  Orientation to Place 5  Registration 3  Attention/ Calculation 5  Recall 3  Language- name 2 objects 2  Language- repeat 1  Language- follow 3 step command 3  Language- read & follow direction 1  Write a sentence 1  Copy design 1  Total score 30        10/20/2022   11:27 AM 06/06/2019    8:59 AM 06/02/2018    5:23 PM  6CIT Screen  What Year? 0 points 0 points 0 points  What month? 0 points 0 points 0 points  What time? 0 points 0 points 0 points  Count back from 20 0 points 0 points 0 points  Months in reverse 0 points 0 points 0 points  Repeat phrase 0 points 0 points 0 points  Total Score 0 points 0 points 0 points    Immunizations Immunization History  Administered Date(s) Administered   Influenza, High Dose Seasonal PF 05/21/2016, 05/27/2017, 06/02/2018   Influenza,inj,Quad PF,6+ Mos 05/11/2015   Influenza-Unspecified 04/19/2015   Pneumococcal Conjugate-13 05/11/2015   Pneumococcal Polysaccharide-23 03/31/2013, 06/14/2018  Pneumococcal-Unspecified 04/19/2015   Tdap 08/18/2010, 12/22/2012   Zoster, Live 09/18/2012, 03/18/2013   Screening Tests Health Maintenance  Topic Date Due   INFLUENZA VACCINE  11/16/2022 (Originally 03/18/2022)   Diabetic kidney evaluation - eGFR measurement  12/17/2022   HEMOGLOBIN A1C  12/17/2022   DTaP/Tdap/Td (3 - Td or Tdap) 12/23/2022   Diabetic kidney evaluation - Urine ACR  06/19/2023   FOOT EXAM  06/19/2023   OPHTHALMOLOGY EXAM  09/01/2023    Medicare Annual Wellness (AWV)  10/20/2023   MAMMOGRAM  10/30/2023   COLONOSCOPY (Pts 45-52yr Insurance coverage will need to be confirmed)  08/22/2025   Pneumonia Vaccine 72 Years old  Completed   DEXA SCAN  Completed   Hepatitis C Screening  Completed   HPV VACCINES  Aged Out   COVID-19 Vaccine  Discontinued   Zoster Vaccines- Shingrix  Discontinued    Health Maintenance There are no preventive care reminders to display for this patient.  Mammogram- plans to complete.   Lung Cancer Screening: (Low Dose CT Chest recommended if Age 440-80years, 30 pack-year currently smoking OR have quit w/in 15years.) does not qualify.   Hepatitis C Screening: Completed 12/2015.   Vision Screening: Recommended annual ophthalmology exams for early detection of glaucoma and other disorders of the eye.  Dental Screening: Recommended annual dental exams for proper oral hygiene  Community Resource Referral / Chronic Care Management: CRR required this visit?  No   CCM required this visit?  No      Plan:     I have personally reviewed and noted the following in the patient's chart:   Medical and social history Use of alcohol, tobacco or illicit drugs  Current medications and supplements including opioid prescriptions. Patient is not currently taking opioid prescriptions. Functional ability and status Nutritional status Physical activity Advanced directives List of other physicians Hospitalizations, surgeries, and ER visits in previous 12 months Vitals Screenings to include cognitive, depression, and falls Referrals and appointments  In addition, I have reviewed and discussed with patient certain preventive protocols, quality metrics, and best practice recommendations. A written personalized care plan for preventive services as well as general preventive health recommendations were provided to patient.     DLeta Jungling LPN   [id number]

## 2022-10-23 DIAGNOSIS — N182 Chronic kidney disease, stage 2 (mild): Secondary | ICD-10-CM | POA: Diagnosis not present

## 2022-10-23 DIAGNOSIS — E1122 Type 2 diabetes mellitus with diabetic chronic kidney disease: Secondary | ICD-10-CM | POA: Diagnosis not present

## 2022-10-23 LAB — COMPREHENSIVE METABOLIC PANEL: eGFR: 67

## 2022-10-27 DIAGNOSIS — E1122 Type 2 diabetes mellitus with diabetic chronic kidney disease: Secondary | ICD-10-CM | POA: Diagnosis not present

## 2022-10-27 DIAGNOSIS — I10 Essential (primary) hypertension: Secondary | ICD-10-CM | POA: Diagnosis not present

## 2022-10-27 DIAGNOSIS — R809 Proteinuria, unspecified: Secondary | ICD-10-CM | POA: Diagnosis not present

## 2022-10-27 DIAGNOSIS — N182 Chronic kidney disease, stage 2 (mild): Secondary | ICD-10-CM | POA: Diagnosis not present

## 2022-11-06 DIAGNOSIS — Z1231 Encounter for screening mammogram for malignant neoplasm of breast: Secondary | ICD-10-CM | POA: Diagnosis not present

## 2022-11-06 LAB — HM MAMMOGRAPHY

## 2022-11-07 ENCOUNTER — Encounter: Payer: Self-pay | Admitting: Family Medicine

## 2022-11-24 ENCOUNTER — Other Ambulatory Visit: Payer: Self-pay | Admitting: Family Medicine

## 2022-11-24 DIAGNOSIS — E1121 Type 2 diabetes mellitus with diabetic nephropathy: Secondary | ICD-10-CM

## 2022-12-17 ENCOUNTER — Ambulatory Visit: Payer: Medicare PPO | Admitting: Family Medicine

## 2022-12-17 ENCOUNTER — Encounter: Payer: Self-pay | Admitting: Family Medicine

## 2022-12-17 VITALS — BP 124/76 | HR 76 | Temp 98.1°F | Ht 65.0 in | Wt 169.6 lb

## 2022-12-17 DIAGNOSIS — E785 Hyperlipidemia, unspecified: Secondary | ICD-10-CM | POA: Diagnosis not present

## 2022-12-17 DIAGNOSIS — I10 Essential (primary) hypertension: Secondary | ICD-10-CM | POA: Diagnosis not present

## 2022-12-17 DIAGNOSIS — H6123 Impacted cerumen, bilateral: Secondary | ICD-10-CM

## 2022-12-17 DIAGNOSIS — Z7984 Long term (current) use of oral hypoglycemic drugs: Secondary | ICD-10-CM

## 2022-12-17 DIAGNOSIS — E1121 Type 2 diabetes mellitus with diabetic nephropathy: Secondary | ICD-10-CM

## 2022-12-17 DIAGNOSIS — L309 Dermatitis, unspecified: Secondary | ICD-10-CM

## 2022-12-17 DIAGNOSIS — L84 Corns and callosities: Secondary | ICD-10-CM

## 2022-12-17 LAB — LIPID PANEL
Cholesterol: 148 mg/dL (ref 0–200)
HDL: 50.1 mg/dL (ref >39.00)
LDL Cholesterol: 65 mg/dL (ref 0–99)
NonHDL: 98.09
Total CHOL/HDL Ratio: 3
Triglycerides: 163 mg/dL — ABNORMAL HIGH (ref 0.0–149.0)
VLDL: 32.6 mg/dL (ref 0.0–40.0)

## 2022-12-17 LAB — COMPREHENSIVE METABOLIC PANEL
ALT: 18 U/L (ref 0–35)
AST: 19 U/L (ref 0–37)
Albumin: 4.3 g/dL (ref 3.5–5.2)
Alkaline Phosphatase: 52 U/L (ref 39–117)
BUN: 15 mg/dL (ref 6–23)
CO2: 25 mEq/L (ref 19–32)
Calcium: 9.7 mg/dL (ref 8.4–10.5)
Chloride: 106 mEq/L (ref 96–112)
Creatinine, Ser: 0.99 mg/dL (ref 0.40–1.20)
GFR: 56.76 mL/min — ABNORMAL LOW (ref >60.00)
Glucose, Bld: 112 mg/dL — ABNORMAL HIGH (ref 70–99)
Potassium: 4.2 mEq/L (ref 3.5–5.1)
Sodium: 140 mEq/L (ref 135–145)
Total Bilirubin: 0.8 mg/dL (ref 0.2–1.2)
Total Protein: 6.9 g/dL (ref 6.0–8.3)

## 2022-12-17 LAB — HEMOGLOBIN A1C: Hgb A1c MFr Bld: 7 % — ABNORMAL HIGH (ref 4.6–6.5)

## 2022-12-17 NOTE — Patient Instructions (Signed)
Nice to see you. Dermatology should contact you to schedule an appointment. Please try Vaseline on your hands to see if that is helpful with your calluses.

## 2022-12-17 NOTE — Assessment & Plan Note (Signed)
Chronic issue.  Refer to new dermatologist.

## 2022-12-17 NOTE — Assessment & Plan Note (Signed)
Chronic issue.  This is likely the cause of her discomfort.  She will see ENT as planned.  If her discomfort worsens at all she will contact ENT sooner.

## 2022-12-17 NOTE — Assessment & Plan Note (Signed)
Chronic issue.  Adequately controlled.  Continue Farxiga 10 mg daily.  Check A1c.

## 2022-12-17 NOTE — Assessment & Plan Note (Signed)
Chronic issue.  Well-controlled.  She will continue losartan 100 mg daily.

## 2022-12-17 NOTE — Assessment & Plan Note (Signed)
Chronic issue.  Continue Crestor 40 mg daily.  Check lipid panel. 

## 2022-12-17 NOTE — Assessment & Plan Note (Signed)
Patient with multiple calluses on her hands.  Discussed washing her hands less frequently and washing dishes less frequently.  Discussed use of Vaseline to see if that would help with her calluses.

## 2022-12-17 NOTE — Progress Notes (Signed)
Marikay Alar, MD Phone: [phone]  Amber Oliver is a 72 y.o. female who presents today for f/u.  DIABETES Disease Monitoring: Blood Sugar ranges-generally 110s Polyuria/phagia/dipsia- no      Optho- UTD Medications: Compliance- taking farxiga Hypoglycemic symptoms- no  HYPERTENSION Disease Monitoring Home BP Monitoring not checking regularly Chest pain- no    Dyspnea- no Medications Compliance-  taking losartan.   Edema- no BMET    Component Value Date/Time   NA 141 12/16/2021 0926   NA 141 04/27/2012 1403   K 4.1 12/16/2021 0926   K 3.7 04/27/2012 1403   CL 107 12/16/2021 0926   CL 106 04/27/2012 1403   CO2 25 12/16/2021 0926   CO2 25 04/27/2012 1403   GLUCOSE 98 12/16/2021 0926   GLUCOSE 149 (H) 04/27/2012 1403   BUN 14 12/16/2021 0926   BUN 13 04/27/2012 1403   CREATININE 0.93 12/16/2021 0926   CREATININE 0.73 04/27/2012 1403   CALCIUM 9.4 12/16/2021 0926   CALCIUM 9.8 04/27/2012 1403   GFRNONAA >60 04/27/2012 1403   GFRAA >60 04/27/2012 1403   Eczema: Patient would like referral to new dermatologist.  Left ear pain: Patient notes a little bit of tenderness to touch externally just outside of the ear canal.  This occurs intermittently.  Does not bother her if she is not touching it.  She does have a history of wax buildup in both of her ears and sees ENT twice a year for that.  Hand calluses: Patient notes she gets calluses on her hands and they hurt when they get cracked open.  She does wash her hands a lot and washes dishes frequently.  She uses lotion with no benefit.  She notes no joint pain.   Social History   Tobacco Use  Smoking Status Former   Packs/day: 0.25   Years: 20.00   Additional pack years: 0.00   Total pack years: 5.00   Types: Cigarettes   Quit date: 05/09/1999   Years since quitting: 23.6  Smokeless Tobacco Never    Current Outpatient Medications on File Prior to Visit  Medication Sig Dispense Refill   blood glucose meter  kit and supplies KIT Dispense based on patient and insurance preference. Use once daily as directed. (FOR ICD-10 E11.9). 1 each 0   FARXIGA 10 MG TABS tablet Take by mouth.     glucose blood (ACCU-CHEK GUIDE) test strip USE 1 STRIP TO CHECK GLUCOSE ONCE DAILY AS DIRECTED FOR BLOOD SUGAR 50 each 1   losartan (COZAAR) 100 MG tablet Take 1 tablet by mouth daily.     omeprazole (PRILOSEC) 40 MG capsule Take 40 mg by mouth daily as needed.      rosuvastatin (CRESTOR) 40 MG tablet Take 1 tablet by mouth once daily 90 tablet 1   Cholecalciferol (VITAMIN D3) 25 MCG (1000 UT) CAPS Vitamin D3 25 mcg (1,000 unit) capsule   1 capsule every day by oral route.     No current facility-administered medications on file prior to visit.     ROS see history of present illness  Objective  Physical Exam Vitals:   12/17/22 0853  BP: 124/76  Pulse: 76  Temp: 98.1 F (36.7 C)  SpO2: 97%    BP Readings from Last 3 Encounters:  12/17/22 124/76  06/18/22 115/70  12/16/21 118/76   Wt Readings from Last 3 Encounters:  12/17/22 169 lb 9.6 oz (76.9 kg)  10/20/22 171 lb (77.6 kg)  06/18/22 171 lb 6.4 oz (77.7 kg)  Physical Exam Constitutional:      General: She is not in acute distress.    Appearance: She is not diaphoretic.  HENT:     Ears:     Comments: Bilateral ear canals with cerumen, no apparent erythema or swelling of the portions of ear canal that I can see Cardiovascular:     Rate and Rhythm: Normal rate and regular rhythm.     Heart sounds: Normal heart sounds.  Pulmonary:     Effort: Pulmonary effort is normal.     Breath sounds: Normal breath sounds.  Skin:    General: Skin is warm and dry.     Comments: Calluses over several joints on the dorsal surfaces of her fingers  Neurological:     Mental Status: She is alert.      Assessment/Plan: Please see individual problem list.  Primary hypertension Assessment & Plan: Chronic issue.  Well-controlled.  She will continue  losartan 100 mg daily.  Orders: -     Comprehensive metabolic panel -     Lipid panel  Type 2 diabetes with nephropathy (HCC) Assessment & Plan: Chronic issue.  Adequately controlled.  Continue Farxiga 10 mg daily.  Check A1c.  Orders: -     Hemoglobin A1c  Hyperlipidemia LDL goal <70 Assessment & Plan: Chronic issue.  Continue Crestor 40 mg daily.  Check lipid panel.  Orders: -     Comprehensive metabolic panel -     Lipid panel  Eczema, unspecified type Assessment & Plan: Chronic issue.  Refer to new dermatologist.  Orders: -     Ambulatory referral to Dermatology  Bilateral impacted cerumen Assessment & Plan: Chronic issue.  This is likely the cause of her discomfort.  She will see ENT as planned.  If her discomfort worsens at all she will contact ENT sooner.   Callus of hand Assessment & Plan: Patient with multiple calluses on her hands.  Discussed washing her hands less frequently and washing dishes less frequently.  Discussed use of Vaseline to see if that would help with her calluses.      Return in about 6 months (around 06/19/2023).   Marikay Alar, MD [facility]

## 2022-12-18 ENCOUNTER — Telehealth: Payer: Self-pay

## 2022-12-18 NOTE — Telephone Encounter (Signed)
Left message to call back regarding lab results.

## 2022-12-18 NOTE — Telephone Encounter (Signed)
-----   Message from Glori Luis, MD sent at 12/18/2022 11:46 AM EDT ----- Please let the patient know her A1c is slightly worse than it has been.  It is now 7.0.  I would like to see it less than 7.  She needs to try to work on diet and exercise to help with this.  She should continue with her current diabetic medication.  Her kidney function is generally stable.  Her other lab work is acceptable.

## 2022-12-19 ENCOUNTER — Telehealth: Payer: Self-pay

## 2022-12-19 NOTE — Telephone Encounter (Signed)
Patient returned office phone call and note was read from Dr Birdie Sons.

## 2022-12-19 NOTE — Telephone Encounter (Signed)
-----   Message from Eric G Sonnenberg, MD sent at 12/18/2022 11:46 AM EDT ----- Please let the patient know her A1c is slightly worse than it has been.  It is now 7.0.  I would like to see it less than 7.  She needs to try to work on diet and exercise to help with this.  She should continue with her current diabetic medication.  Her kidney function is generally stable.  Her other lab work is acceptable. 

## 2022-12-19 NOTE — Telephone Encounter (Signed)
Noted  

## 2022-12-19 NOTE — Telephone Encounter (Signed)
Left message to call back regarding lab results.

## 2023-01-29 DIAGNOSIS — H6123 Impacted cerumen, bilateral: Secondary | ICD-10-CM | POA: Diagnosis not present

## 2023-01-29 DIAGNOSIS — H902 Conductive hearing loss, unspecified: Secondary | ICD-10-CM | POA: Diagnosis not present

## 2023-02-25 DIAGNOSIS — N182 Chronic kidney disease, stage 2 (mild): Secondary | ICD-10-CM | POA: Diagnosis not present

## 2023-02-25 DIAGNOSIS — E1122 Type 2 diabetes mellitus with diabetic chronic kidney disease: Secondary | ICD-10-CM | POA: Diagnosis not present

## 2023-02-25 LAB — PROTEIN / CREATININE RATIO, URINE
Creatinine, Urine: 124
Creatinine, Urine: 124

## 2023-02-25 LAB — COMPREHENSIVE METABOLIC PANEL
eGFR: 68
eGFR: 68

## 2023-03-02 DIAGNOSIS — E1122 Type 2 diabetes mellitus with diabetic chronic kidney disease: Secondary | ICD-10-CM | POA: Diagnosis not present

## 2023-03-02 DIAGNOSIS — N182 Chronic kidney disease, stage 2 (mild): Secondary | ICD-10-CM | POA: Diagnosis not present

## 2023-03-02 DIAGNOSIS — R809 Proteinuria, unspecified: Secondary | ICD-10-CM | POA: Diagnosis not present

## 2023-03-02 DIAGNOSIS — I10 Essential (primary) hypertension: Secondary | ICD-10-CM | POA: Diagnosis not present

## 2023-05-01 ENCOUNTER — Encounter: Payer: Self-pay | Admitting: *Deleted

## 2023-05-29 ENCOUNTER — Other Ambulatory Visit: Payer: Self-pay | Admitting: Family Medicine

## 2023-06-04 LAB — LAB REPORT - SCANNED: Creatinine, POC: 25 mg/dL

## 2023-06-19 ENCOUNTER — Ambulatory Visit: Payer: Medicare PPO | Admitting: Family Medicine

## 2023-07-06 ENCOUNTER — Encounter: Payer: Self-pay | Admitting: Family Medicine

## 2023-07-06 ENCOUNTER — Ambulatory Visit: Payer: Medicare PPO

## 2023-07-06 ENCOUNTER — Ambulatory Visit: Payer: Medicare PPO | Admitting: Family Medicine

## 2023-07-06 VITALS — BP 126/68 | HR 68 | Temp 98.3°F | Ht 65.0 in | Wt 166.8 lb

## 2023-07-06 DIAGNOSIS — M25552 Pain in left hip: Secondary | ICD-10-CM

## 2023-07-06 DIAGNOSIS — E1121 Type 2 diabetes mellitus with diabetic nephropathy: Secondary | ICD-10-CM

## 2023-07-06 DIAGNOSIS — Z7984 Long term (current) use of oral hypoglycemic drugs: Secondary | ICD-10-CM

## 2023-07-06 DIAGNOSIS — M47816 Spondylosis without myelopathy or radiculopathy, lumbar region: Secondary | ICD-10-CM | POA: Diagnosis not present

## 2023-07-06 DIAGNOSIS — I10 Essential (primary) hypertension: Secondary | ICD-10-CM | POA: Diagnosis not present

## 2023-07-06 DIAGNOSIS — M1612 Unilateral primary osteoarthritis, left hip: Secondary | ICD-10-CM | POA: Diagnosis not present

## 2023-07-06 LAB — MICROALBUMIN / CREATININE URINE RATIO
Creatinine,U: 64.4 mg/dL
Microalb Creat Ratio: 4.3 mg/g (ref 0.0–30.0)
Microalb, Ur: 2.8 mg/dL — ABNORMAL HIGH (ref 0.0–1.9)

## 2023-07-06 LAB — POCT GLYCOSYLATED HEMOGLOBIN (HGB A1C): Hemoglobin A1C: 6.6 % — AB (ref 4.0–5.6)

## 2023-07-06 NOTE — Assessment & Plan Note (Signed)
Chronic issue.  Check A1c.  Continue Farxiga 10 mg daily.

## 2023-07-06 NOTE — Patient Instructions (Signed)
Nice to see you. Will contact you with your lab results and x-ray results.  It could take up to 2 weeks to get your x-ray results back.

## 2023-07-06 NOTE — Progress Notes (Signed)
Amber Alar, MD Phone: [phone]  Amber Oliver is a 72 y.o. female who presents today for f/u.  DIABETES Disease Monitoring: Blood Sugar ranges-125 this morning Polyuria/phagia/dipsia- no      Optho- UTD Medications: Compliance- taking farxiga Hypoglycemic symptoms- no  HYPERTENSION Disease Monitoring Home BP Monitoring highest it has been is 124/81 Chest pain- no    Dyspnea- no Medications Compliance-  taking losartan.   Edema- no BMET    Component Value Date/Time   NA 140 12/17/2022 0906   NA 141 04/27/2012 1403   K 4.2 12/17/2022 0906   K 3.7 04/27/2012 1403   CL 106 12/17/2022 0906   CL 106 04/27/2012 1403   CO2 25 12/17/2022 0906   CO2 25 04/27/2012 1403   GLUCOSE 112 (H) 12/17/2022 0906   GLUCOSE 149 (H) 04/27/2012 1403   BUN 15 12/17/2022 0906   BUN 13 04/27/2012 1403   CREATININE 0.99 12/17/2022 0906   CREATININE 0.73 04/27/2012 1403   CALCIUM 9.7 12/17/2022 0906   CALCIUM 9.8 04/27/2012 1403   GFRNONAA >60 04/27/2012 1403   GFRAA >60 04/27/2012 1403   Left hip pain: Patient notes this has been going on for some time now.  Feels as though it is a catch in her left hip.  At times it will catch and feels like her hip will stop moving.  Radiates down towards her knee when this occurs.  No injury.   Social History   Tobacco Use  Smoking Status Former   Current packs/day: 0.00   Average packs/day: 0.3 packs/day for 20.0 years (5.0 ttl pk-yrs)   Types: Cigarettes   Start date: 05/09/1979   Quit date: 05/09/1999   Years since quitting: 24.1  Smokeless Tobacco Never    Current Outpatient Medications on File Prior to Visit  Medication Sig Dispense Refill   blood glucose meter kit and supplies KIT Dispense based on patient and insurance preference. Use once daily as directed. (FOR ICD-10 E11.9). 1 each 0   FARXIGA 10 MG TABS tablet Take by mouth.     glucose blood (ACCU-CHEK GUIDE) test strip USE 1 STRIP TO CHECK GLUCOSE ONCE DAILY AS DIRECTED FOR  BLOOD SUGAR 50 each 1   losartan (COZAAR) 100 MG tablet Take 1 tablet by mouth daily.     rosuvastatin (CRESTOR) 40 MG tablet Take 1 tablet by mouth once daily 90 tablet 0   No current facility-administered medications on file prior to visit.     ROS see history of present illness  Objective  Physical Exam Vitals:   07/06/23 0950 07/06/23 1015  BP: 130/78 126/68  Pulse: 68   Temp: 98.3 F (36.8 C)   SpO2: 97%     BP Readings from Last 3 Encounters:  07/06/23 126/68  12/17/22 124/76  06/18/22 115/70   Wt Readings from Last 3 Encounters:  07/06/23 166 lb 12.8 oz (75.7 kg)  12/17/22 169 lb 9.6 oz (76.9 kg)  10/20/22 171 lb (77.6 kg)    Physical Exam Constitutional:      General: She is not in acute distress.    Appearance: She is not diaphoretic.  Cardiovascular:     Rate and Rhythm: Normal rate and regular rhythm.     Heart sounds: Normal heart sounds.  Pulmonary:     Effort: Pulmonary effort is normal.     Breath sounds: Normal breath sounds.  Musculoskeletal:     Comments: Slight tenderness over the left anterior hip, discomfort on external range of motion left hip  with reduced range of motion externally, good internal range of motion with no discomfort  Skin:    General: Skin is warm and dry.  Neurological:     Mental Status: She is alert.    Diabetic Foot Exam - Simple   Simple Foot Form Diabetic Foot exam was performed with the following findings: Yes 07/06/2023 10:08 AM  Visual Inspection No deformities, no ulcerations, no other skin breakdown bilaterally: Yes Sensation Testing Intact to touch and monofilament testing bilaterally: Yes Pulse Check Posterior Tibialis and Dorsalis pulse intact bilaterally: Yes Comments      Assessment/Plan: Please see individual problem list.  Primary hypertension Assessment & Plan: Chronic issue.  Well-controlled at home.  She will continue losartan 100 mg daily.   Type 2 diabetes with nephropathy  (HCC) Assessment & Plan: Chronic issue.  Check A1c.  Continue Farxiga 10 mg daily.  Orders: -     POCT glycosylated hemoglobin (Hb A1C) -     Microalbumin / creatinine urine ratio  Left hip pain Assessment & Plan: Suspect related to arthritis.  X-ray completed today.  Results will determine next step in management.  Orders: -     DG HIP UNILAT W OR W/O PELVIS 2-3 VIEWS LEFT; Future    Return in about 6 months (around 01/03/2024) for Transfer of care with Dr. Clent Ridges.   Amber Alar, MD [facility]

## 2023-07-06 NOTE — Assessment & Plan Note (Signed)
Suspect related to arthritis.  X-ray completed today.  Results will determine next step in management.

## 2023-07-06 NOTE — Assessment & Plan Note (Signed)
Chronic issue.  Well-controlled at home.  She will continue losartan 100 mg daily.

## 2023-07-10 ENCOUNTER — Ambulatory Visit: Payer: Medicare PPO | Admitting: Family Medicine

## 2023-07-21 DIAGNOSIS — R809 Proteinuria, unspecified: Secondary | ICD-10-CM | POA: Diagnosis not present

## 2023-07-21 DIAGNOSIS — E1122 Type 2 diabetes mellitus with diabetic chronic kidney disease: Secondary | ICD-10-CM | POA: Diagnosis not present

## 2023-07-21 DIAGNOSIS — I10 Essential (primary) hypertension: Secondary | ICD-10-CM | POA: Diagnosis not present

## 2023-07-21 DIAGNOSIS — N182 Chronic kidney disease, stage 2 (mild): Secondary | ICD-10-CM | POA: Diagnosis not present

## 2023-07-24 ENCOUNTER — Encounter: Payer: Self-pay | Admitting: Family Medicine

## 2023-07-24 DIAGNOSIS — M25552 Pain in left hip: Secondary | ICD-10-CM

## 2023-07-29 ENCOUNTER — Encounter: Payer: Self-pay | Admitting: Family Medicine

## 2023-07-29 DIAGNOSIS — L600 Ingrowing nail: Secondary | ICD-10-CM

## 2023-07-30 DIAGNOSIS — H60543 Acute eczematoid otitis externa, bilateral: Secondary | ICD-10-CM | POA: Diagnosis not present

## 2023-07-30 DIAGNOSIS — H6123 Impacted cerumen, bilateral: Secondary | ICD-10-CM | POA: Diagnosis not present

## 2023-08-05 ENCOUNTER — Encounter: Payer: Self-pay | Admitting: Family Medicine

## 2023-08-06 DIAGNOSIS — E1122 Type 2 diabetes mellitus with diabetic chronic kidney disease: Secondary | ICD-10-CM | POA: Diagnosis not present

## 2023-08-06 DIAGNOSIS — I129 Hypertensive chronic kidney disease with stage 1 through stage 4 chronic kidney disease, or unspecified chronic kidney disease: Secondary | ICD-10-CM | POA: Diagnosis not present

## 2023-08-06 DIAGNOSIS — Z87891 Personal history of nicotine dependence: Secondary | ICD-10-CM | POA: Diagnosis not present

## 2023-08-06 DIAGNOSIS — Z8249 Family history of ischemic heart disease and other diseases of the circulatory system: Secondary | ICD-10-CM | POA: Diagnosis not present

## 2023-08-06 DIAGNOSIS — E11620 Type 2 diabetes mellitus with diabetic dermatitis: Secondary | ICD-10-CM | POA: Diagnosis not present

## 2023-08-06 DIAGNOSIS — E785 Hyperlipidemia, unspecified: Secondary | ICD-10-CM | POA: Diagnosis not present

## 2023-08-06 DIAGNOSIS — M1990 Unspecified osteoarthritis, unspecified site: Secondary | ICD-10-CM | POA: Diagnosis not present

## 2023-08-06 DIAGNOSIS — N182 Chronic kidney disease, stage 2 (mild): Secondary | ICD-10-CM | POA: Diagnosis not present

## 2023-08-06 DIAGNOSIS — Z809 Family history of malignant neoplasm, unspecified: Secondary | ICD-10-CM | POA: Diagnosis not present

## 2023-08-17 ENCOUNTER — Encounter: Payer: Self-pay | Admitting: Family Medicine

## 2023-08-26 ENCOUNTER — Ambulatory Visit: Payer: Medicare PPO | Admitting: Podiatry

## 2023-09-01 ENCOUNTER — Other Ambulatory Visit: Payer: Self-pay | Admitting: Family Medicine

## 2023-09-02 ENCOUNTER — Ambulatory Visit: Payer: Medicare PPO | Admitting: Podiatry

## 2023-09-16 ENCOUNTER — Ambulatory Visit: Payer: Medicare PPO | Admitting: Podiatry

## 2023-09-16 ENCOUNTER — Encounter: Payer: Self-pay | Admitting: Podiatry

## 2023-09-16 DIAGNOSIS — M79676 Pain in unspecified toe(s): Secondary | ICD-10-CM

## 2023-09-16 DIAGNOSIS — B351 Tinea unguium: Secondary | ICD-10-CM

## 2023-09-16 DIAGNOSIS — E1142 Type 2 diabetes mellitus with diabetic polyneuropathy: Secondary | ICD-10-CM | POA: Diagnosis not present

## 2023-09-16 NOTE — Progress Notes (Signed)
  Subjective:  Patient ID: Amber Oliver, female    DOB: 03/28/1951,  MRN: [id number] HPI Chief Complaint  Patient presents with   Debridement    Requesting trimming of toenails - diabetic - 6.4   New Patient (Initial Visit)    Est pt 78    73 y.o. female presents with the above complaint.   ROS: Denies fever chills nausea vomit muscle aches pains calf pain back pain chest pain shortness of breath.  Past Medical History:  Diagnosis Date   Diabetes mellitus    Hyperlipidemia    Hypertension    Kidney stone    Past Surgical History:  Procedure Laterality Date   TRIGGER FINGER RELEASE      Current Outpatient Medications:    mometasone (ELOCON) 0.1 % lotion, APPLY 4 TO 6 DROPS TO ITCHY EARS TWICE DAILY FOR 12 DAYS. MAY REPEAT AS NEEDED FOR ITCHY EARS, Disp: , Rfl:    blood glucose meter kit and supplies KIT, Dispense based on patient and insurance preference. Use once daily as directed. (FOR ICD-10 E11.9)., Disp: 1 each, Rfl: 0   FARXIGA 10 MG TABS tablet, Take by mouth., Disp: , Rfl:    glucose blood (ACCU-CHEK GUIDE) test strip, USE 1 STRIP TO CHECK GLUCOSE ONCE DAILY AS DIRECTED FOR BLOOD SUGAR, Disp: 50 each, Rfl: 1   losartan (COZAAR) 100 MG tablet, Take 1 tablet by mouth daily., Disp: , Rfl:    rosuvastatin (CRESTOR) 40 MG tablet, Take 1 tablet by mouth once daily, Disp: 90 tablet, Rfl: 0  Allergies  Allergen Reactions   Ciprofloxacin     swelling   Sulfa Drugs Cross Reactors    Sulfa Antibiotics Rash   Review of Systems Objective:  There were no vitals filed for this visit.  General: Well developed, nourished, in no acute distress, alert and oriented x3   Dermatological: Skin is warm, dry and supple bilateral. Nails x 10 are well maintained; remaining integument appears unremarkable at this time. There are no open sores, no preulcerative lesions, no rash or signs of infection present.  Vascular: Dorsalis Pedis artery and Posterior Tibial artery pedal pulses are  2/4 bilateral with immedate capillary fill time. Pedal hair growth present. No varicosities and no lower extremity edema present bilateral.   Neruologic: Grossly intact via light touch bilateral. Vibratory intact via tuning fork bilateral. Protective threshold with Semmes Wienstein monofilament intact to all pedal sites bilateral. Patellar and Achilles deep tendon reflexes 2+ bilateral. No Babinski or clonus noted bilateral.   Musculoskeletal: No gross boney pedal deformities bilateral. No pain, crepitus, or limitation noted with foot and ankle range of motion bilateral. Muscular strength 5/5 in all groups tested bilateral.  Gait: Unassisted, Nonantalgic.    Radiographs:  None taken  Assessment & Plan:   Assessment: Ingrown toenail second digit left foot otherwise elongated painful toenails  Plan: Debridement of toenails 1 through 5 bilateral secondary to incurvation and pain she will follow-up with Dr.Galaway in 2 to 3 months     Sharilyn [facility] [place], [place]

## 2023-09-21 DIAGNOSIS — D2271 Melanocytic nevi of right lower limb, including hip: Secondary | ICD-10-CM | POA: Diagnosis not present

## 2023-09-21 DIAGNOSIS — L821 Other seborrheic keratosis: Secondary | ICD-10-CM | POA: Diagnosis not present

## 2023-09-21 DIAGNOSIS — D2262 Melanocytic nevi of left upper limb, including shoulder: Secondary | ICD-10-CM | POA: Diagnosis not present

## 2023-09-21 DIAGNOSIS — D225 Melanocytic nevi of trunk: Secondary | ICD-10-CM | POA: Diagnosis not present

## 2023-09-21 DIAGNOSIS — L853 Xerosis cutis: Secondary | ICD-10-CM | POA: Diagnosis not present

## 2023-09-21 DIAGNOSIS — D2261 Melanocytic nevi of right upper limb, including shoulder: Secondary | ICD-10-CM | POA: Diagnosis not present

## 2023-09-21 DIAGNOSIS — D2272 Melanocytic nevi of left lower limb, including hip: Secondary | ICD-10-CM | POA: Diagnosis not present

## 2023-09-21 DIAGNOSIS — L570 Actinic keratosis: Secondary | ICD-10-CM | POA: Diagnosis not present

## 2023-10-21 ENCOUNTER — Ambulatory Visit: Payer: Medicare PPO | Admitting: *Deleted

## 2023-10-21 VITALS — Ht 65.0 in | Wt 162.0 lb

## 2023-10-21 DIAGNOSIS — Z0000 Encounter for general adult medical examination without abnormal findings: Secondary | ICD-10-CM

## 2023-10-21 NOTE — Progress Notes (Signed)
 Subjective:   Amber Oliver is a 73 y.o. who presents for a Medicare Wellness preventive visit.  Visit Complete: Virtual I connected with  Amber Oliver on 10/21/23 by a audio enabled telemedicine application and verified that I am speaking with the correct person using two identifiers.  Patient Location: Home  Provider Location: Home Office  I discussed the limitations of evaluation and management by telemedicine. The patient expressed understanding and agreed to proceed.  Vital Signs: Because this visit was a virtual/telehealth visit, some criteria may be missing or patient reported. Any vitals not documented were not able to be obtained and vitals that have been documented are patient reported.  VideoDeclined- This patient declined Librarian, academic. Therefore the visit was completed with audio only.  AWV Questionnaire: Yes: Patient Medicare AWV questionnaire was completed by the patient on 10/17/23; I have confirmed that all information answered by patient is correct and no changes since this date.  Cardiac Risk Factors include: advanced age (>80men, >55 women);diabetes mellitus;dyslipidemia;hypertension     Objective:    Today's Vitals   10/21/23 0928  Weight: 162 lb (73.5 kg)  Height: 5\' 5"  (1.651 m)   Body mass index is 26.96 kg/m.     10/21/2023    9:38 AM 10/20/2022   11:18 AM 10/18/2021    9:12 AM 06/06/2019    8:53 AM 06/02/2018    5:20 PM 05/27/2017    8:22 AM  Advanced Directives  Does Patient Have a Medical Advance Directive? No No No No No No  Does patient want to make changes to medical advance directive?      Yes (MAU/Ambulatory/Procedural Areas - Information given)  Would patient like information on creating a medical advance directive? No - Patient declined No - Patient declined No - Patient declined No - Patient declined No - Patient declined     Current Medications (verified) Outpatient Encounter Medications as of 10/21/2023   Medication Sig   blood glucose meter kit and supplies KIT Dispense based on patient and insurance preference. Use once daily as directed. (FOR ICD-10 E11.9).   FARXIGA 10 MG TABS tablet Take by mouth.   glucose blood (ACCU-CHEK GUIDE) test strip USE 1 STRIP TO CHECK GLUCOSE ONCE DAILY AS DIRECTED FOR BLOOD SUGAR   losartan (COZAAR) 100 MG tablet Take 1 tablet by mouth daily.   mometasone (ELOCON) 0.1 % lotion APPLY 4 TO 6 DROPS TO ITCHY EARS TWICE DAILY FOR 12 DAYS. MAY REPEAT AS NEEDED FOR ITCHY EARS   rosuvastatin (CRESTOR) 40 MG tablet Take 1 tablet by mouth once daily   No facility-administered encounter medications on file as of 10/21/2023.    Allergies (verified) Ciprofloxacin, Sulfa drugs cross reactors, and Sulfa antibiotics   History: Past Medical History:  Diagnosis Date   Diabetes mellitus    Hyperlipidemia    Hypertension    Kidney stone    Past Surgical History:  Procedure Laterality Date   TRIGGER FINGER RELEASE     Family History  Problem Relation Age of Onset   Heart disease Mother    Heart failure Mother    Diabetes Father    Lung cancer Father    Diabetes Sister    Diabetes Brother    Cancer Maternal Grandmother        breast   Social History   Socioeconomic History   Marital status: Married    Spouse name: Not on file   Number of children: Not on file   Years of  education: Not on file   Highest education level: Some college, no degree  Occupational History   Not on file  Tobacco Use   Smoking status: Former    Current packs/day: 0.00    Average packs/day: 0.3 packs/day for 20.0 years (5.0 ttl pk-yrs)    Types: Cigarettes    Start date: 05/09/1979    Quit date: 05/09/1999    Years since quitting: 24.4   Smokeless tobacco: Never  Substance and Sexual Activity   Alcohol use: No    Alcohol/week: 0.0 standard drinks of alcohol   Drug use: No   Sexual activity: Never  Other Topics Concern   Not on file  Social History Narrative   Not on file    Social Drivers of Health   Financial Resource Strain: Low Risk  (10/17/2023)   Overall Financial Resource Strain (CARDIA)    Difficulty of Paying Living Expenses: Not hard at all  Food Insecurity: No Food Insecurity (10/17/2023)   Hunger Vital Sign    Worried About Running Out of Food in the Last Year: Never true    Ran Out of Food in the Last Year: Never true  Transportation Needs: No Transportation Needs (10/17/2023)   PRAPARE - Administrator, Civil Service (Medical): No    Lack of Transportation (Non-Medical): No  Physical Activity: Insufficiently Active (10/17/2023)   Exercise Vital Sign    Days of Exercise per Week: 4 days    Minutes of Exercise per Session: 30 min  Stress: No Stress Concern Present (10/17/2023)   [employer] of Occupational Health - Occupational Stress Questionnaire    Feeling of Stress : Only a little  Social Connections: Socially Integrated (10/17/2023)   Social Connection and Isolation Panel [NHANES]    Frequency of Communication with Friends and Family: Twice a week    Frequency of Social Gatherings with Friends and Family: More than three times a week    Attends Religious Services: More than 4 times per year    Active Member of [employer] or Organizations: Yes    Attends Engineer, structural: More than 4 times per year    Marital Status: Married    Tobacco Counseling Counseling given: Not Answered    Clinical Intake:  Pre-visit preparation completed: Yes  Pain : No/denies pain     BMI - recorded: 26.96 Nutritional Status: BMI 25 -29 Overweight Nutritional Risks: None Diabetes: Yes CBG done?: Yes (per patient FBS 105) CBG resulted in Enter/ Edit results?: No Did pt. bring in CBG monitor from home?: No  How often do you need to have someone help you when you read instructions, pamphlets, or other written materials from your doctor or pharmacy?: 1 - Never  Interpreter Needed?: No  Information entered by :: R. Darian Cansler  LPN   Activities of Daily Living     10/17/2023    8:41 AM  In your present state of health, do you have any difficulty performing the following activities:  Hearing? 0  Vision? 0  Difficulty concentrating or making decisions? 0  Walking or climbing stairs? 0  Dressing or bathing? 0  Doing errands, shopping? 0  Preparing Food and eating ? N  Using the Toilet? N  In the past six months, have you accidently leaked urine? N  Do you have problems with loss of bowel control? N  Managing your Medications? N  Managing your Finances? N  Housekeeping or managing your Housekeeping? N    Patient Care Team: Marikay Alar  G, MD (Inactive) as PCP - General (Family Medicine)  Indicate any recent Medical Services you may have received from other than [facility] providers in the past year (date may be approximate).     Assessment:   This is a routine wellness examination for Karolee.  Hearing/Vision screen Hearing Screening - Comments:: No issues Vision Screening - Comments:: glasses   Goals Addressed             This Visit's Progress    Patient Stated       Wants to continue to do her crafts        Depression Screen     10/21/2023    9:32 AM 07/06/2023    9:52 AM 12/17/2022    8:55 AM 10/20/2022   11:20 AM 06/18/2022    8:48 AM 10/18/2021    9:11 AM 06/14/2021    8:34 AM  PHQ 2/9 Scores  PHQ - 2 Score 0 0 0 0 0 0 0  PHQ- 9 Score 1 1 0        Fall Risk     10/17/2023    8:41 AM 07/06/2023    9:52 AM 12/17/2022    8:54 AM 10/15/2022   12:01 PM 06/18/2022    8:48 AM  Fall Risk   Falls in the past year? 0 0 0 0 0  Number falls in past yr: 0 0 0 0 0  Injury with Fall? 0 0 0 0 0  Risk for fall due to : No Fall Risks No Fall Risks No Fall Risks  No Fall Risks  Follow up Falls prevention discussed;Falls evaluation completed Falls evaluation completed Falls evaluation completed  Falls evaluation completed    MEDICARE RISK AT HOME:  Medicare Risk at Home Any stairs in or around the  home?: (Patient-Rptd) Yes If so, are there any without handrails?: (Patient-Rptd) Yes Home free of loose throw rugs in walkways, pet beds, electrical cords, etc?: (Patient-Rptd) Yes Adequate lighting in your home to reduce risk of falls?: (Patient-Rptd) Yes Life alert?: (Patient-Rptd) No Use of a cane, walker or w/c?: (Patient-Rptd) No Grab bars in the bathroom?: (Patient-Rptd) Yes Shower chair or bench in shower?: (Patient-Rptd) No Elevated toilet seat or a handicapped toilet?: (Patient-Rptd) No  TIMED UP AND GO:  Was the test performed?  No  Cognitive Function: 6CIT completed    05/27/2017    8:40 AM  MMSE - Mini Mental State Exam  Orientation to time 5  Orientation to Place 5  Registration 3  Attention/ Calculation 5  Recall 3  Language- name 2 objects 2  Language- repeat 1  Language- follow 3 step command 3  Language- read & follow direction 1  Write a sentence 1  Copy design 1  Total score 30        10/20/2022   11:27 AM 06/06/2019    8:59 AM 06/02/2018    5:23 PM  6CIT Screen  What Year? 0 points 0 points 0 points  What month? 0 points 0 points 0 points  What time? 0 points 0 points 0 points  Count back from 20 0 points 0 points 0 points  Months in reverse 0 points 0 points 0 points  Repeat phrase 0 points 0 points 0 points  Total Score 0 points 0 points 0 points    Immunizations Immunization History  Administered Date(s) Administered   Influenza, High Dose Seasonal PF 05/21/2016, 05/27/2017, 06/02/2018   Influenza,inj,Quad PF,6+ Mos 05/11/2015   Influenza-Unspecified 04/19/2015  Pneumococcal Conjugate-13 05/11/2015   Pneumococcal Polysaccharide-23 03/31/2013, 06/14/2018   Pneumococcal-Unspecified 04/19/2015   Tdap 08/18/2010, 12/22/2012   Zoster, Live 09/18/2012, 03/18/2013    Screening Tests Health Maintenance  Topic Date Due   DTaP/Tdap/Td (3 - Td or Tdap) 12/23/2022   OPHTHALMOLOGY EXAM  09/01/2023   Medicare Annual Wellness (AWV)  10/20/2023    INFLUENZA VACCINE  11/16/2023 (Originally 03/19/2023)   HEMOGLOBIN A1C  01/03/2024   Diabetic kidney evaluation - eGFR measurement  02/25/2024   Diabetic kidney evaluation - Urine ACR  07/05/2024   FOOT EXAM  07/05/2024   MAMMOGRAM  11/05/2024   Colonoscopy  08/22/2025   Pneumonia Vaccine 2+ Years old  Completed   DEXA SCAN  Completed   Hepatitis C Screening  Completed   HPV VACCINES  Aged Out   COVID-19 Vaccine  Discontinued   Zoster Vaccines- Shingrix  Discontinued    Health Maintenance  Health Maintenance Due  Topic Date Due   DTaP/Tdap/Td (3 - Td or Tdap) 12/23/2022   OPHTHALMOLOGY EXAM  09/01/2023   Medicare Annual Wellness (AWV)  10/20/2023   Health Maintenance Items Addressed: Patient stated that she gets her mammogram at [facility] in [facility] and will be scheduling that soon. Patient stated that she will discuss with them if she can have her bone density done there also. Patient will call if she needs an order for this. Discussed with patient the need for a tetanus and shingles vaccines.  Additional Screening:  Vision Screening: Recommended annual ophthalmology exams for early detection of glaucoma and other disorders of the eye.Up to date [facility] in [place]. Patient stated that she will call and have them send her last office notes  Dental Screening: Recommended annual dental exams for proper oral hygiene  Community Resource Referral / Chronic Care Management: CRR required this visit?  No   CCM required this visit?  No     Plan:     I have personally reviewed and noted the following in the patient's chart:   Medical and social history Use of alcohol, tobacco or illicit drugs  Current medications and supplements including opioid prescriptions. Patient is not currently taking opioid prescriptions. Functional ability and status Nutritional status Physical activity Advanced directives List of other physicians Hospitalizations, surgeries,  and ER visits in previous 12 months Vitals Screenings to include cognitive, depression, and falls Referrals and appointments  In addition, I have reviewed and discussed with patient certain preventive protocols, quality metrics, and best practice recommendations. A written personalized care plan for preventive services as well as general preventive health recommendations were provided to patient.     Sydell Axon, LPN   08/23/1094   After Visit Summary: (MyChart) Due to this being a telephonic visit, the after visit summary with patients personalized plan was offered to patient via MyChart   Notes: Nothing significant to report at this time.

## 2023-10-21 NOTE — Patient Instructions (Signed)
 Amber Oliver , Thank you for taking time to come for your Medicare Wellness Visit. I appreciate your ongoing commitment to your health goals. Please review the following plan we discussed and let me know if I can assist you in the future.   Referrals/Orders/Follow-Ups/Clinician Recommendations: Consider updating your tetanus and shingles vaccines.   This is a list of the screening recommended for you and due dates:  Health Maintenance  Topic Date Due   DTaP/Tdap/Td vaccine (3 - Td or Tdap) 12/23/2022   Eye exam for diabetics  09/01/2023   Flu Shot  11/16/2023*   Hemoglobin A1C  01/03/2024   Yearly kidney function blood test for diabetes  02/25/2024   Yearly kidney health urinalysis for diabetes  07/05/2024   Complete foot exam   07/05/2024   Medicare Annual Wellness Visit  10/20/2024   Mammogram  11/05/2024   Colon Cancer Screening  08/22/2025   Pneumonia Vaccine  Completed   DEXA scan (bone density measurement)  Completed   Hepatitis C Screening  Completed   HPV Vaccine  Aged Out   COVID-19 Vaccine  Discontinued   Zoster (Shingles) Vaccine  Discontinued  *Topic was postponed. The date shown is not the original due date.    Advanced directives: (Declined) Advance directive discussed with you today. Even though you declined this today, please call our office should you change your mind, and we can give you the proper paperwork for you to fill out.  Next Medicare Annual Wellness Visit scheduled for next year: Yes 10/25/24 @9 :30

## 2023-11-08 ENCOUNTER — Encounter: Payer: Self-pay | Admitting: Family Medicine

## 2023-12-01 LAB — HM MAMMOGRAPHY

## 2023-12-02 ENCOUNTER — Other Ambulatory Visit: Payer: Self-pay

## 2023-12-02 MED ORDER — ROSUVASTATIN CALCIUM 40 MG PO TABS: 40.0000 mg | ORAL_TABLET | Freq: Every day | ORAL | 0 refills | Status: DC

## 2023-12-17 ENCOUNTER — Encounter: Payer: Self-pay | Admitting: Podiatry

## 2023-12-17 ENCOUNTER — Ambulatory Visit: Payer: Medicare PPO | Admitting: Podiatry

## 2023-12-17 DIAGNOSIS — B351 Tinea unguium: Secondary | ICD-10-CM | POA: Diagnosis not present

## 2023-12-17 DIAGNOSIS — M79676 Pain in unspecified toe(s): Secondary | ICD-10-CM | POA: Diagnosis not present

## 2023-12-17 DIAGNOSIS — E1121 Type 2 diabetes mellitus with diabetic nephropathy: Secondary | ICD-10-CM

## 2023-12-17 NOTE — Progress Notes (Unsigned)
  Subjective:  Patient ID: Amber Oliver, female    DOB: 03-Nov-1950,  MRN: [id number]  73 y.o. female presents preventative diabetic foot care and painful, discolored, thick toenails which interfere with daily activities  Chief Complaint  Patient presents with   Diabetes    "I need my nails trimmed."  Dr. Rhesa Celeste - 07/21/2023; A1c - 6.6    New problem(s): None   PCP is No primary care provider on file. , and last visit was {Time; dates multiple:15870}.  Allergies  Allergen Reactions   Ciprofloxacin      swelling   Sulfa  Drugs Cross Reactors    Sulfa  Antibiotics Rash    Review of Systems: Negative except as noted in the HPI.   Objective:  Amber Oliver is a pleasant 73 y.o. female WD, WN in NAD. AAO x 3.  Vascular Examination: Vascular status intact b/l with palpable pedal pulses. CFT immediate b/l. Pedal hair present. No edema. No pain with calf compression b/l. Skin temperature gradient WNL b/l. No varicosities noted. No cyanosis or clubbing noted.  Neurological Examination: Sensation grossly intact b/l with 10 gram monofilament. Vibratory sensation intact b/l.  Dermatological Examination: Pedal skin with normal turgor, texture and tone b/l. No open wounds nor interdigital macerations noted. Toenails 1-5 b/l thick, discolored, elongated with subungual debris and pain on dorsal palpation. No hyperkeratotic lesions noted b/l.   Musculoskeletal Examination: Muscle strength 5/5 to b/l LE.  No pain, crepitus noted b/l. No gross pedal deformities. Patient ambulates independently without assistive aids.   Radiographs: None  Last A1c:      Latest Ref Rng & Units 07/06/2023   10:14 AM  Hemoglobin A1C  Hemoglobin-A1c 4.0 - 5.6 % 6.6      Assessment:  No diagnosis found.  Plan:  Patient was evaluated and treated. All patient's and/or POA's questions/concerns addressed on today's visit. Toenails 1-5 debrided in length and girth without incident. Continue soft, supportive  shoe gear daily. Report any pedal injuries to medical professional. Call office if there are any questions/concerns. -Patient/POA to call should there be question/concern in the interim.  Return in about 3 months (around 03/18/2024).  Luella Sager, DPM      [facility] LOCATION: [address] [place] [address]                   Office [phone]   [facility] LOCATION: [address] [place], [place] [address] Office [phone]

## 2023-12-24 ENCOUNTER — Encounter: Payer: Self-pay | Admitting: Podiatry

## 2024-01-04 ENCOUNTER — Encounter: Payer: Medicare PPO | Admitting: Family Medicine

## 2024-01-14 LAB — MICROALBUMIN, URINE: Microalb, Ur: 30

## 2024-01-19 DIAGNOSIS — E1122 Type 2 diabetes mellitus with diabetic chronic kidney disease: Secondary | ICD-10-CM | POA: Diagnosis not present

## 2024-01-19 DIAGNOSIS — R809 Proteinuria, unspecified: Secondary | ICD-10-CM | POA: Diagnosis not present

## 2024-01-19 DIAGNOSIS — I10 Essential (primary) hypertension: Secondary | ICD-10-CM | POA: Diagnosis not present

## 2024-01-19 DIAGNOSIS — N182 Chronic kidney disease, stage 2 (mild): Secondary | ICD-10-CM | POA: Diagnosis not present

## 2024-01-27 ENCOUNTER — Other Ambulatory Visit: Payer: Self-pay

## 2024-01-27 DIAGNOSIS — E1121 Type 2 diabetes mellitus with diabetic nephropathy: Secondary | ICD-10-CM

## 2024-01-27 MED ORDER — ACCU-CHEK GUIDE TEST VI STRP: ORAL_STRIP | 2 refills | Status: AC

## 2024-01-28 DIAGNOSIS — H903 Sensorineural hearing loss, bilateral: Secondary | ICD-10-CM | POA: Diagnosis not present

## 2024-01-28 DIAGNOSIS — H6123 Impacted cerumen, bilateral: Secondary | ICD-10-CM | POA: Diagnosis not present

## 2024-03-07 ENCOUNTER — Other Ambulatory Visit: Payer: Self-pay | Admitting: Internal Medicine

## 2024-03-07 DIAGNOSIS — E785 Hyperlipidemia, unspecified: Secondary | ICD-10-CM

## 2024-03-07 NOTE — Telephone Encounter (Signed)
 Pt is scheduled to see Dr. Abbey tomorrow. Is it okay to refill?

## 2024-03-07 NOTE — Telephone Encounter (Signed)
 1. Hyperlipidemia LDL goal <70 (Primary) - rosuvastatin  (CRESTOR ) 40 MG tablet; Take 1 tablet by mouth once daily  Dispense: 90 tablet; Refill: 0   Luke Shade, MD

## 2024-03-08 ENCOUNTER — Ambulatory Visit

## 2024-03-08 VITALS — BP 118/66 | HR 74 | Temp 98.2°F | Ht 65.0 in | Wt 163.4 lb

## 2024-03-08 DIAGNOSIS — R197 Diarrhea, unspecified: Secondary | ICD-10-CM | POA: Diagnosis not present

## 2024-03-08 DIAGNOSIS — Z780 Asymptomatic menopausal state: Secondary | ICD-10-CM

## 2024-03-08 DIAGNOSIS — E785 Hyperlipidemia, unspecified: Secondary | ICD-10-CM | POA: Diagnosis not present

## 2024-03-08 DIAGNOSIS — I10 Essential (primary) hypertension: Secondary | ICD-10-CM | POA: Diagnosis not present

## 2024-03-08 DIAGNOSIS — N1831 Chronic kidney disease, stage 3a: Secondary | ICD-10-CM

## 2024-03-08 DIAGNOSIS — N182 Chronic kidney disease, stage 2 (mild): Secondary | ICD-10-CM

## 2024-03-08 DIAGNOSIS — I2510 Atherosclerotic heart disease of native coronary artery without angina pectoris: Secondary | ICD-10-CM | POA: Diagnosis not present

## 2024-03-08 DIAGNOSIS — E1121 Type 2 diabetes mellitus with diabetic nephropathy: Secondary | ICD-10-CM | POA: Diagnosis not present

## 2024-03-08 MED ORDER — ROSUVASTATIN CALCIUM 40 MG PO TABS: 40.0000 mg | ORAL_TABLET | Freq: Every day | ORAL | 2 refills | Status: AC

## 2024-03-08 NOTE — Assessment & Plan Note (Signed)
 Chronic stable, continue Rosuvastatin  40 mg daily, refill sent. Check lipid panel.

## 2024-03-08 NOTE — Assessment & Plan Note (Signed)
 Due for DEXA scan for osteoporosis screening. Patient requesting referral to [facility], imaging ordered.

## 2024-03-08 NOTE — Progress Notes (Signed)
 Abstracted

## 2024-03-08 NOTE — Assessment & Plan Note (Signed)
 Chronic, diet controlled. Repeat A1c today. Given patient's age, co-morbidities, s/e from Metformin  in the past goal A1c <7.5%

## 2024-03-08 NOTE — Assessment & Plan Note (Signed)
 Renal function stable.  Lab with nephrologist on 01/14/24, GFR 68, Cr 0.89. Continue Farxiga 10 mg daily and f/u with nephrologist.

## 2024-03-08 NOTE — Progress Notes (Signed)
 Established Patient Office Visit TOC from Dr. Maribeth     Subjective  Patient ID: Amber Oliver, female    DOB: 05-07-1951  Age: 73 y.o. MRN: [id number]  Chief Complaint  Patient presents with   Establish Care  Nephrologist: Lateef, Munsoor, MD Podiatrist: Gaynel Delon CROME, DPM   Dermatologist: Dela Alm LABOR, MD Mammogram and Dexa at [facility]  She  has a past medical history of Diabetes mellitus, Hyperlipidemia, Hypertension, and Kidney stone.  HPI Discussed the use of AI scribe software for clinical note transcription with the patient, who gave verbal consent to proceed.  History of Present Illness Amber Oliver is a 73 year old female with diabetes,diabetes-related kidney disease, hypertension who presents to establish care.   DM II, CKD: She has diabetes and diabetes-related kidney disease. She uses an AccuCheck device to monitor her blood sugar but does not check it daily due to finger discomfort. She has experienced episodes of feeling jittery and sweaty about once or twice in the last six months, which she attributes to dietary choices. She manages these episodes by consuming orange juice. Used to take Metformin  for diabetes but d/c due to diet control and s/e from Metformin .   She is currently taking Farxiga 10 mg daily Her diet is generally balanced, and she avoids carbohydrate-rich foods. She engages in walking as her primary form of exercise. She sees a nephrologist every six months. Her kidney function has been stable.   HTN: She is on losartan  100 mg for blood pressure. No chest pain.   She has a history of foot issues and sees a podiatrist every four months for toenail trimming due to difficulty with regular pedicures. She has a persistent fungal infection on one toenail.  She experiences occasional diarrhea, which she associates with stress and has been diagnosed with IBS in the past. She manages her symptoms without regular medication.  She reports a fall  four months ago while assisting her husband, resulting in bruising and a knee injury, but she has since returned to normal function. No ongoing dizziness or chest pain.  She experiences occasional sleep disturbances, particularly when waking up with a racing mind, but does not consider it a significant problem. She has tried melatonin in the past without much benefit and prefers to avoid medications.  She is due for DEXA scan, last one done on 2018 was normal.    ROS As per HPI    Objective:     BP 118/66 (BP Location: Right Arm, Patient Position: Sitting, Cuff Size: Normal)   Pulse 74   Temp 98.2 F (36.8 C) (Oral)   Ht 5' 5 (1.651 m)   Wt 163 lb 6.4 oz (74.1 kg)   SpO2 95%   BMI 27.19 kg/m      03/08/2024    3:04 PM 10/21/2023    9:32 AM 07/06/2023    9:52 AM  Depression screen PHQ 2/9  Decreased Interest 0 0 0  Down, Depressed, Hopeless 0 0 0  PHQ - 2 Score 0 0 0  Altered sleeping 0 1 1  Tired, decreased energy 0 0 0  Change in appetite 0 0 0  Feeling bad or failure about yourself  0 0 0  Trouble concentrating 0 0 0  Moving slowly or fidgety/restless 0 0 0  Suicidal thoughts 0 0 0  PHQ-9 Score 0 1 1  Difficult doing work/chores Not difficult at all Not difficult at all Not difficult at all      03/08/2024  3:04 PM 07/06/2023    9:52 AM 12/17/2022    8:55 AM  GAD 7 : Generalized Anxiety Score  Nervous, Anxious, on Edge 0 0 0  Control/stop worrying 0 0 0  Worry too much - different things 0 0 0  Trouble relaxing 0 0 0  Restless 0 0 0  Easily annoyed or irritable 0 1 0  Afraid - awful might happen 0 0 0  Total GAD 7 Score 0 1 0  Anxiety Difficulty Not difficult at all Not difficult at all Not difficult at all      03/08/2024    3:04 PM 10/21/2023    9:32 AM 07/06/2023    9:52 AM  Depression screen PHQ 2/9  Decreased Interest 0 0 0  Down, Depressed, Hopeless 0 0 0  PHQ - 2 Score 0 0 0  Altered sleeping 0 1 1  Tired, decreased energy 0 0 0  Change in  appetite 0 0 0  Feeling bad or failure about yourself  0 0 0  Trouble concentrating 0 0 0  Moving slowly or fidgety/restless 0 0 0  Suicidal thoughts 0 0 0  PHQ-9 Score 0 1 1  Difficult doing work/chores Not difficult at all Not difficult at all Not difficult at all      03/08/2024    3:04 PM 07/06/2023    9:52 AM 12/17/2022    8:55 AM  GAD 7 : Generalized Anxiety Score  Nervous, Anxious, on Edge 0 0 0  Control/stop worrying 0 0 0  Worry too much - different things 0 0 0  Trouble relaxing 0 0 0  Restless 0 0 0  Easily annoyed or irritable 0 1 0  Afraid - awful might happen 0 0 0  Total GAD 7 Score 0 1 0  Anxiety Difficulty Not difficult at all Not difficult at all Not difficult at all   SDOH Screenings   Food Insecurity: No Food Insecurity (10/17/2023)  Housing: Low Risk  (10/17/2023)  Transportation Needs: No Transportation Needs (10/17/2023)  Utilities: Not At Risk (10/21/2023)  Alcohol Screen: Low Risk  (10/17/2023)  Depression (PHQ2-9): Low Risk  (03/08/2024)  Financial Resource Strain: Low Risk  (10/17/2023)  Physical Activity: Insufficiently Active (10/17/2023)  Social Connections: Socially Integrated (10/17/2023)  Stress: No Stress Concern Present (10/17/2023)  Tobacco Use: Medium Risk (03/08/2024)  Health Literacy: Adequate Health Literacy (10/21/2023)     Physical Exam Constitutional:      Appearance: Normal appearance.  HENT:     Head: Normocephalic and atraumatic.     Right Ear: Tympanic membrane normal.     Left Ear: Tympanic membrane normal.     Mouth/Throat:     Mouth: Mucous membranes are moist.  Eyes:     General: No scleral icterus. Neck:     Thyroid : No thyroid  mass or thyroid  tenderness.  Cardiovascular:     Rate and Rhythm: Normal rate and regular rhythm.  Pulmonary:     Effort: Pulmonary effort is normal.     Breath sounds: Normal breath sounds.  Abdominal:     General: Abdomen is protuberant. Bowel sounds are normal.     Palpations: Abdomen is soft.      Tenderness: There is no guarding.  Musculoskeletal:     Cervical back: Neck supple. No rigidity.     Right lower leg: No edema.     Left lower leg: No edema.  Skin:    General: Skin is warm.  Neurological:     Mental  Status: She is alert and oriented to person, place, and time.  Psychiatric:        Mood and Affect: Mood normal.        Behavior: Behavior normal.        Results for orders placed or performed in visit on 03/08/24  Microalbumin, urine  Result Value Ref Range   Microalb, Ur 30     The 10-year ASCVD risk score (Arnett DK, et al., 2019) is: 28.4%     Assessment & Plan:   Primary hypertension -     Lipid panel  Hyperlipidemia LDL goal <70 Assessment & Plan: Chronic stable, continue Rosuvastatin  40 mg daily, refill sent. Check lipid panel.   Orders: -     Lipid panel -     Rosuvastatin  Calcium ; Take 1 tablet (40 mg total) by mouth daily.  Dispense: 90 tablet; Refill: 2  Type 2 diabetes with nephropathy (HCC) Assessment & Plan: Chronic, diet controlled. Repeat A1c today. Given patient's age, co-morbidities, s/e from Metformin  in the past goal A1c <7.5%   Orders: -     Hemoglobin A1c  Postmenopausal estrogen deficiency Assessment & Plan: Due for DEXA scan for osteoporosis screening. Patient requesting referral to [facility], imaging ordered.   Orders: -     DG Bone Density; Future  CKD (chronic kidney disease), stage II Assessment & Plan: Renal function stable.  Lab with nephrologist on 01/14/24, GFR 68, Cr 0.89. Continue Farxiga 10 mg daily and f/u with nephrologist.    Atherosclerosis of native coronary artery of native heart without angina pectoris Assessment & Plan: Risk factors optimization, continue Rosuvastatin  40 mg daily.    Diarrhea, unspecified type Assessment & Plan: Rarely occurs, and patient not bothered by it. Suspicious for IBS-diarrhea predominant. If increased frequency, weight change, abdominal pain recommend evaluation.       Return in about 6 months (around 09/08/2024) for Chronic .   Luke Shade, MD

## 2024-03-08 NOTE — Assessment & Plan Note (Signed)
 Risk factors optimization, continue Rosuvastatin  40 mg daily.

## 2024-03-08 NOTE — Assessment & Plan Note (Signed)
 Rarely occurs, and patient not bothered by it. Suspicious for IBS-diarrhea predominant. If increased frequency, weight change, abdominal pain recommend evaluation.

## 2024-03-09 ENCOUNTER — Ambulatory Visit: Payer: Self-pay

## 2024-03-09 LAB — LIPID PANEL
Cholesterol: 150 mg/dL (ref 0–200)
HDL: 54 mg/dL (ref >39.00)
LDL Cholesterol: 53 mg/dL (ref 0–99)
NonHDL: 96.14
Total CHOL/HDL Ratio: 3
Triglycerides: 218 mg/dL — ABNORMAL HIGH (ref 0.0–149.0)
VLDL: 43.6 mg/dL — ABNORMAL HIGH (ref 0.0–40.0)

## 2024-03-09 LAB — HEMOGLOBIN A1C: Hgb A1c MFr Bld: 6.9 % — ABNORMAL HIGH (ref 4.6–6.5)

## 2024-03-17 ENCOUNTER — Ambulatory Visit: Admitting: Podiatry

## 2024-03-17 ENCOUNTER — Encounter: Payer: Self-pay | Admitting: Podiatry

## 2024-03-17 DIAGNOSIS — M79676 Pain in unspecified toe(s): Secondary | ICD-10-CM

## 2024-03-17 DIAGNOSIS — E1121 Type 2 diabetes mellitus with diabetic nephropathy: Secondary | ICD-10-CM | POA: Diagnosis not present

## 2024-03-17 DIAGNOSIS — B351 Tinea unguium: Secondary | ICD-10-CM | POA: Diagnosis not present

## 2024-03-17 DIAGNOSIS — E1142 Type 2 diabetes mellitus with diabetic polyneuropathy: Secondary | ICD-10-CM | POA: Diagnosis not present

## 2024-03-17 NOTE — Progress Notes (Signed)
  Subjective:  Patient ID: Amber Oliver, female    DOB: 1950-12-29,  MRN: [id number]  73 y.o. female presents to clinic with  preventative diabetic foot care and painful thick toenails that are difficult to trim. Pain interferes with ambulation. Aggravating factors include wearing enclosed shoe gear. Pain is relieved with periodic professional debridement.  Chief Complaint  Patient presents with   RFC    Rm2 Diabetic foot care/ Dr. Abbey Last visit July 2025/ A1C 6.9     New problem(s): None   PCP is Bair, Kalpana, MD.  Allergies  Allergen Reactions   Ciprofloxacin      swelling   Sulfa  Drugs Cross Reactors    Sulfa  Antibiotics Rash    Review of Systems: Negative except as noted in the HPI.   Objective:  Amber Oliver is a pleasant 73 y.o. female WD, WN in NAD. AAO x 3.  Vascular Examination: Vascular status intact b/l with palpable pedal pulses. Pedal hair sparse. CFT immediate b/l. No edema. No pain with calf compression b/l. Skin temperature gradient WNL b/l. No cyanosis or clubbing noted b/l LE.  Neurological Examination: Sensation grossly intact b/l with 10 gram monofilament. Vibratory sensation intact b/l.   Dermatological Examination: Pedal skin with normal turgor, texture and tone b/l. Toenails 1-5 b/l thick, discolored, elongated with subungual debris and pain on dorsal palpation. No hyperkeratotic lesions noted b/l. Old toenail noted to be dorsal to incoming new toenail. Both remain intact. No erythema, no edema, no pain nor drainage..  Musculoskeletal Examination: Muscle strength 5/5 to b/l LE. No pain, crepitus or joint limitation noted with ROM bilateral LE. No gross bony deformities bilaterally.  Radiographs: None  Last A1c:      Latest Ref Rng & Units 03/08/2024    3:29 PM 07/06/2023   10:14 AM  Hemoglobin A1C  Hemoglobin-A1c 4.6 - 6.5 % 6.9  6.6      Assessment:   1. Pain due to onychomycosis of toenail   2. Type 2 diabetes with nephropathy (HCC)      Plan:  Consent given for treatment. Patient examined. All patient's and/or POA's questions/concerns addressed on today's visit. Mycotic toenails 1-5 debrided in length and girth without incident. Monitor right great toenail for any acute changes. Continue foot and shoe inspections daily. Monitor blood glucose per PCP/Endocrinologist's recommendations.Continue soft, supportive shoe gear daily. Report any pedal injuries to medical professional. Call office if there are any quesitons/concerns. -Patient/POA to call should there be question/concern in the interim.  Return in about 3 months (around 06/17/2024).  Delon LITTIE Merlin, DPM      [place] LOCATION: [address] [place] [address]                   Office [phone]   [facility] LOCATION: [address] [place], [place] [address] Office [phone]

## 2024-03-19 ENCOUNTER — Encounter: Payer: Self-pay | Admitting: Podiatry

## 2024-04-01 NOTE — Telephone Encounter (Signed)
 Noted

## 2024-05-10 NOTE — Progress Notes (Signed)
 Amber Oliver                                          MRN: [id number]   05/10/2024   The [facility] Quality Team Specialist reviewed this patient medical record for the purposes of chart review for care gap closure. The following were reviewed: abstraction for care gap closure-glycemic status assessment.    [facility] Quality Team

## 2024-06-17 ENCOUNTER — Ambulatory Visit: Admitting: Podiatry

## 2024-06-23 ENCOUNTER — Ambulatory Visit: Admitting: Podiatry

## 2024-06-23 ENCOUNTER — Encounter: Payer: Self-pay | Admitting: Podiatry

## 2024-06-23 DIAGNOSIS — B351 Tinea unguium: Secondary | ICD-10-CM | POA: Diagnosis not present

## 2024-06-23 DIAGNOSIS — E1121 Type 2 diabetes mellitus with diabetic nephropathy: Secondary | ICD-10-CM

## 2024-06-23 DIAGNOSIS — M79676 Pain in unspecified toe(s): Secondary | ICD-10-CM | POA: Diagnosis not present

## 2024-06-23 NOTE — Patient Instructions (Signed)
 Contact PCP regarding leg/foot cramping.   See Dr. Verta if great toes become painful, red and swollen.

## 2024-07-02 NOTE — Progress Notes (Signed)
  Subjective:  Patient ID: Amber Oliver, female    DOB: [birth year],  MRN: [id number]  Amber Oliver presents to clinic today for preventative diabetic foot care for painful, discolored, thick toenails which interfere with daily activities Patient relates tenderness left great toe lateral border. Patient states she has been having lower extremity cramping. Chief Complaint  Patient presents with   Nail Problem    Thick painful toenails, 3 month follow up    New problem(s): None.   PCP is Bair, Kalpana, MD.  Allergies  Allergen Reactions   Ciprofloxacin      swelling   Sulfa  Drugs Cross Reactors    Sulfa  Antibiotics Rash    Review of Systems: Negative except as noted in the HPI.  Objective:  There were no vitals filed for this visit. Amber Oliver is a pleasant 73 y.o. female WD, WN in NAD. AAO x 3.  Vascular Examination: Vascular status intact b/l with palpable pedal pulses. Pedal hair sparse. CFT immediate b/l. No edema. No pain with calf compression b/l. Skin temperature gradient WNL b/l. No cyanosis or clubbing noted b/l LE.  Neurological Examination: Sensation grossly intact b/l with 10 gram monofilament. Vibratory sensation intact b/l.   Dermatological Examination: Pedal skin with normal turgor, texture and tone b/l. Toenails 1-5 b/l thick, discolored, elongated with subungual debris and pain on dorsal palpation. No hyperkeratotic lesions noted b/l. Old toenail noted to be dorsal to incoming new toenail. Both remain intact. No erythema, no edema, no pain nor drainage..  Incurvated nailplate left great toe lateral border(s) with tenderness to palpation. No erythema, no edema, no drainage noted.  Musculoskeletal Examination: Muscle strength 5/5 to b/l LE. No pain, crepitus or joint limitation noted with ROM bilateral LE. No gross bony deformities bilaterally.  Radiographs: None  Assessment/Plan: 1. Pain due to onychomycosis of toenail   2. Type 2 diabetes with  nephropathy (HCC)   -Patient was evaluated today. All questions/concerns addressed on today's visit. -Advised patient to contact PCP regarding lower extremity cramping. -Continue foot and shoe inspections daily. Monitor blood glucose per PCP/Endocrinologist's recommendations. -Patient to continue soft, supportive shoe gear daily. -Toenails were debrided in length and girth 2-5 bilaterally and R hallux with sterile nail nippers and dremel without iatrogenic bleeding.  -No invasive procedure(s) performed. Offending nail border debrided and curretaged left great toe utilizing sterile nail nipper and currette. Border cleansed with alcohol and triple antibiotic ointment applied. No further treatment required by patient/caregiver. Call office if there are any concerns. -Patient/POA to call should there be question/concern in the interim.  Return in about 3 months (around 09/23/2024).  Delon LITTIE Merlin, DPM      [place] LOCATION: [address] [place] [address]                   Office [phone]   [facility] LOCATION: [address] [place], [place] [address] Office [phone]

## 2024-07-19 DIAGNOSIS — R809 Proteinuria, unspecified: Secondary | ICD-10-CM | POA: Diagnosis not present

## 2024-07-19 DIAGNOSIS — E1122 Type 2 diabetes mellitus with diabetic chronic kidney disease: Secondary | ICD-10-CM | POA: Diagnosis not present

## 2024-07-19 DIAGNOSIS — I10 Essential (primary) hypertension: Secondary | ICD-10-CM | POA: Diagnosis not present

## 2024-07-19 DIAGNOSIS — N182 Chronic kidney disease, stage 2 (mild): Secondary | ICD-10-CM | POA: Diagnosis not present

## 2024-07-25 DIAGNOSIS — Z7984 Long term (current) use of oral hypoglycemic drugs: Secondary | ICD-10-CM | POA: Diagnosis not present

## 2024-07-25 DIAGNOSIS — I10 Essential (primary) hypertension: Secondary | ICD-10-CM | POA: Diagnosis not present

## 2024-07-25 DIAGNOSIS — N182 Chronic kidney disease, stage 2 (mild): Secondary | ICD-10-CM | POA: Diagnosis not present

## 2024-07-25 DIAGNOSIS — E1122 Type 2 diabetes mellitus with diabetic chronic kidney disease: Secondary | ICD-10-CM | POA: Diagnosis not present

## 2024-07-25 DIAGNOSIS — R809 Proteinuria, unspecified: Secondary | ICD-10-CM | POA: Diagnosis not present

## 2024-07-28 DIAGNOSIS — H900 Conductive hearing loss, bilateral: Secondary | ICD-10-CM | POA: Diagnosis not present

## 2024-07-28 DIAGNOSIS — H6123 Impacted cerumen, bilateral: Secondary | ICD-10-CM | POA: Diagnosis not present

## 2024-09-09 ENCOUNTER — Ambulatory Visit

## 2024-09-27 ENCOUNTER — Ambulatory Visit

## 2024-10-03 ENCOUNTER — Ambulatory Visit: Admitting: Podiatry

## 2024-10-25 ENCOUNTER — Ambulatory Visit
